# Patient Record
Sex: Male | Born: 1935 | Race: White | Hispanic: No | Smoking: Former smoker
Health system: Southern US, Community
[De-identification: ages and names within clinical notes are randomized; demographics above are authoritative.]

## PROBLEM LIST (undated history)

## (undated) DIAGNOSIS — I350 Nonrheumatic aortic (valve) stenosis: Secondary | ICD-10-CM

## (undated) DIAGNOSIS — J189 Pneumonia, unspecified organism: Secondary | ICD-10-CM

## (undated) DIAGNOSIS — M1990 Unspecified osteoarthritis, unspecified site: Secondary | ICD-10-CM

## (undated) DIAGNOSIS — R0609 Other forms of dyspnea: Secondary | ICD-10-CM

## (undated) DIAGNOSIS — R0989 Other specified symptoms and signs involving the circulatory and respiratory systems: Secondary | ICD-10-CM

## (undated) DIAGNOSIS — R0600 Dyspnea, unspecified: Secondary | ICD-10-CM

## (undated) DIAGNOSIS — K219 Gastro-esophageal reflux disease without esophagitis: Secondary | ICD-10-CM

## (undated) DIAGNOSIS — E7800 Pure hypercholesterolemia, unspecified: Secondary | ICD-10-CM

## (undated) DIAGNOSIS — I10 Essential (primary) hypertension: Secondary | ICD-10-CM

## (undated) DIAGNOSIS — I6529 Occlusion and stenosis of unspecified carotid artery: Secondary | ICD-10-CM

## (undated) HISTORY — PX: CARDIAC CATHETERIZATION: SHX172

## (undated) HISTORY — PX: TONSILLECTOMY: SUR1361

## (undated) HISTORY — DX: Essential (primary) hypertension: I10

## (undated) HISTORY — DX: Occlusion and stenosis of unspecified carotid artery: I65.29

## (undated) HISTORY — PX: MENISCUS REPAIR: SHX5179

## (undated) HISTORY — DX: Nonrheumatic aortic (valve) stenosis: I35.0

## (undated) HISTORY — DX: Other specified symptoms and signs involving the circulatory and respiratory systems: R09.89

## (undated) HISTORY — PX: COLONOSCOPY: SHX174

## (undated) HISTORY — PX: HERNIA REPAIR: SHX51

## (undated) HISTORY — DX: Gastro-esophageal reflux disease without esophagitis: K21.9

## (undated) HISTORY — DX: Other forms of dyspnea: R06.09

---
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
## 1998-03-06 ENCOUNTER — Other Ambulatory Visit: Admission: RE | Admit: 1998-03-06 | Discharge: 1998-03-06 | Payer: Self-pay | Admitting: Gastroenterology

## 1998-03-28 ENCOUNTER — Ambulatory Visit (HOSPITAL_COMMUNITY): Admission: RE | Admit: 1998-03-28 | Discharge: 1998-03-28 | Payer: Self-pay | Admitting: Gastroenterology

## 1999-12-04 ENCOUNTER — Encounter: Admission: RE | Admit: 1999-12-04 | Discharge: 1999-12-04 | Payer: Self-pay | Admitting: *Deleted

## 1999-12-04 ENCOUNTER — Encounter: Payer: Self-pay | Admitting: *Deleted

## 2002-09-08 ENCOUNTER — Ambulatory Visit (HOSPITAL_COMMUNITY): Admission: RE | Admit: 2002-09-08 | Discharge: 2002-09-08 | Payer: Self-pay | Admitting: Family Medicine

## 2002-09-08 ENCOUNTER — Encounter: Payer: Self-pay | Admitting: Family Medicine

## 2003-06-28 ENCOUNTER — Encounter: Admission: RE | Admit: 2003-06-28 | Discharge: 2003-06-28 | Payer: Self-pay | Admitting: Orthopedic Surgery

## 2003-07-17 ENCOUNTER — Encounter: Admission: RE | Admit: 2003-07-17 | Discharge: 2003-07-17 | Payer: Self-pay | Admitting: Orthopedic Surgery

## 2003-08-02 ENCOUNTER — Encounter: Admission: RE | Admit: 2003-08-02 | Discharge: 2003-08-02 | Payer: Self-pay | Admitting: Orthopedic Surgery

## 2006-07-03 ENCOUNTER — Encounter: Admission: RE | Admit: 2006-07-03 | Discharge: 2006-07-03 | Payer: Self-pay | Admitting: Orthopedic Surgery

## 2007-06-09 ENCOUNTER — Encounter: Admission: RE | Admit: 2007-06-09 | Discharge: 2007-06-09 | Payer: Self-pay | Admitting: General Surgery

## 2007-06-14 ENCOUNTER — Ambulatory Visit (HOSPITAL_BASED_OUTPATIENT_CLINIC_OR_DEPARTMENT_OTHER): Admission: RE | Admit: 2007-06-14 | Discharge: 2007-06-14 | Payer: Self-pay | Admitting: Otolaryngology

## 2010-07-16 NOTE — Op Note (Signed)
[name], [name]                ACCOUNT NO.:  [id number]   MEDICAL RECORD NO.:  [id number]          PATIENT TYPE:  AMB   LOCATION:  DSC                          FACILITY:  [facility]   PHYSICIAN:  Adolph Pollack, M.D.DATE OF BIRTH:  Apr 01, 1931   DATE OF PROCEDURE:  DATE OF DISCHARGE:                               OPERATIVE REPORT   PREOPERATIVE DIAGNOSIS:  Left inguinal hernia.   POSTOPERATIVE DIAGNOSIS:  Left inguinal hernia.   PROCEDURE:  Left inguinal hernia repair with mesh.   SURGEON:  Adolph Pollack, M.D.   ANESTHESIA:  General by way of LMA plus Marcaine local.   INDICATIONS:  This 77 year old male who is very active and has noticed a  left inguinal bulge that is somewhat symptomatic.  No obstructive  symptoms but discomfort.  He now presents for repair.  The procedure,  risks, and aftercare have been discussed with him and his wife  preoperatively.   TECHNIQUE:  He was seen in the holding area and left groin marked with  my initials.  He was then brought to the operating room, placed supine  on the operating table, and general anesthetic was administered.  The  hair in the left groin was clipped and the area was sterilely prepped  and draped.  Marcaine solution was infiltrated superficially and deep in  the left groin.  The left groin incision was made through the skin and  subcutaneous tissue and Scarpa's fascia until the external oblique  aponeurosis was exposed.  I then injected local anesthetic deep to the  external oblique aponeurosis.  I then divided the external oblique  aponeurosis through the external ring medially and up toward the  anterosuperior iliac spine laterally.  Using blunt dissection, I exposed  the internal oblique aponeurosis and muscle superiorly and the shelving  edge of the inguinal ligament inferiorly.   Following this, I noted a spermatic cord and hernia sac and direct  contents.  I isolated the spermatic cord to create a window around  it.  Noted there was also a small direct hernia present.  I dissected the  hernia sac and contents free from the spermatic cord and replaced it to  the patulous internal ring.   Following this, a piece of 3 x 6 inch polypropylene mesh was brought  into the field and anchored 2 cm medial to the pubic tubercle with 2-0  Prolene suture.  The inferior aspect of the mesh was anchored to the  shelving edge of the inguinal ligament with a running 2-0 Prolene suture  up to level 2 cm lateral to the internal ring.  A slit was cut in the  mesh, and these 2 tails wrapped around the spermatic cord.  The superior  aspect of the mesh was anchored to the internal oblique aponeurosis with  interrupted 2-0 Vicryl sutures.  The 2 tails of the mesh were then  crossed creating a new internal ring and they were anchored to the  shelving edge of the inguinal ligament with a 2-0 Prolene suture.  This  provided more than adequate coverage with good overlap of  the direct and  indirect femoral spaces.  The lateral aspect of the mesh was then tucked  deep to the external oblique aponeurosis.   Following this, hemostasis was adequate.  I closed the external oblique  aponeurosis over the mesh and cord with running 3-0 Vicryl suture.  Scarpa's fascia was closed with a running 2-0 Vicryl suture.  The skin  was closed with a 4-0 Monocryl subcuticular stitch followed by Steri-  Strips and sterile dressings.  Needle, sponge, and instrument counts  were reported to be correct prior to final closure.   He tolerated the procedure without any apparent complications.  The left  testicle was in its normal position in the scrotum, and he was taken to  recovery in satisfactory condition.      Adolph Pollack, M.D.  Electronically Signed     TJR/MEDQ  D:  06/14/2007  T:  06/15/2007  Job:  [id number]   cc:   Ralene Ok, M.D.

## 2010-11-26 LAB — COMPREHENSIVE METABOLIC PANEL
ALT: 35
AST: 27
Albumin: 4
Alkaline Phosphatase: 50
BUN: 13
CO2: 27
Calcium: 9.3
Chloride: 108
Creatinine, Ser: 0.74
GFR calc Af Amer: >60
GFR calc non Af Amer: >60
Glucose, Bld: 108 — ABNORMAL HIGH
Potassium: 4.1
Sodium: 140
Total Bilirubin: 0.7
Total Protein: 6.2

## 2010-11-26 LAB — CBC
HCT: 40.7
Hemoglobin: 14.3
MCHC: 35
MCV: 92.2
Platelets: 159
RBC: 4.42
RDW: 12.6
WBC: 4.6

## 2010-11-26 LAB — DIFFERENTIAL
Basophils Absolute: 0
Basophils Relative: 0
Eosinophils Absolute: 0.1
Eosinophils Relative: 3
Lymphocytes Relative: 28
Lymphs Abs: 1.3
Monocytes Absolute: 0.5
Monocytes Relative: 12
Neutro Abs: 2.6
Neutrophils Relative %: 58

## 2010-11-26 LAB — PROTIME-INR
INR: 0.9
Prothrombin Time: 12.7

## 2010-11-26 LAB — POCT HEMOGLOBIN-HEMACUE: Hemoglobin: 14.8

## 2010-12-31 ENCOUNTER — Other Ambulatory Visit: Payer: Self-pay | Admitting: Gastroenterology

## 2011-03-13 DIAGNOSIS — N529 Male erectile dysfunction, unspecified: Secondary | ICD-10-CM | POA: Diagnosis not present

## 2011-03-13 DIAGNOSIS — E785 Hyperlipidemia, unspecified: Secondary | ICD-10-CM | POA: Diagnosis not present

## 2011-03-13 DIAGNOSIS — E559 Vitamin D deficiency, unspecified: Secondary | ICD-10-CM | POA: Diagnosis not present

## 2011-03-13 DIAGNOSIS — M1990 Unspecified osteoarthritis, unspecified site: Secondary | ICD-10-CM | POA: Diagnosis not present

## 2011-03-18 DIAGNOSIS — E785 Hyperlipidemia, unspecified: Secondary | ICD-10-CM | POA: Diagnosis not present

## 2011-03-18 DIAGNOSIS — E559 Vitamin D deficiency, unspecified: Secondary | ICD-10-CM | POA: Diagnosis not present

## 2011-04-15 DIAGNOSIS — L259 Unspecified contact dermatitis, unspecified cause: Secondary | ICD-10-CM | POA: Diagnosis not present

## 2011-04-15 DIAGNOSIS — D2360 Other benign neoplasm of skin of unspecified upper limb, including shoulder: Secondary | ICD-10-CM | POA: Diagnosis not present

## 2011-04-15 DIAGNOSIS — L570 Actinic keratosis: Secondary | ICD-10-CM | POA: Diagnosis not present

## 2011-04-15 DIAGNOSIS — Z85820 Personal history of malignant melanoma of skin: Secondary | ICD-10-CM | POA: Diagnosis not present

## 2011-04-15 DIAGNOSIS — D235 Other benign neoplasm of skin of trunk: Secondary | ICD-10-CM | POA: Diagnosis not present

## 2011-07-24 DIAGNOSIS — M779 Enthesopathy, unspecified: Secondary | ICD-10-CM | POA: Diagnosis not present

## 2011-07-31 DIAGNOSIS — M779 Enthesopathy, unspecified: Secondary | ICD-10-CM | POA: Diagnosis not present

## 2011-09-09 DIAGNOSIS — E785 Hyperlipidemia, unspecified: Secondary | ICD-10-CM | POA: Diagnosis not present

## 2011-09-09 DIAGNOSIS — Z125 Encounter for screening for malignant neoplasm of prostate: Secondary | ICD-10-CM | POA: Diagnosis not present

## 2011-09-09 DIAGNOSIS — E559 Vitamin D deficiency, unspecified: Secondary | ICD-10-CM | POA: Diagnosis not present

## 2011-09-16 DIAGNOSIS — E785 Hyperlipidemia, unspecified: Secondary | ICD-10-CM | POA: Diagnosis not present

## 2011-09-16 DIAGNOSIS — M1990 Unspecified osteoarthritis, unspecified site: Secondary | ICD-10-CM | POA: Diagnosis not present

## 2011-09-16 DIAGNOSIS — E559 Vitamin D deficiency, unspecified: Secondary | ICD-10-CM | POA: Diagnosis not present

## 2011-09-16 DIAGNOSIS — N529 Male erectile dysfunction, unspecified: Secondary | ICD-10-CM | POA: Diagnosis not present

## 2011-09-25 DIAGNOSIS — G5760 Lesion of plantar nerve, unspecified lower limb: Secondary | ICD-10-CM | POA: Diagnosis not present

## 2011-10-01 DIAGNOSIS — M546 Pain in thoracic spine: Secondary | ICD-10-CM | POA: Diagnosis not present

## 2011-10-28 DIAGNOSIS — L821 Other seborrheic keratosis: Secondary | ICD-10-CM | POA: Diagnosis not present

## 2011-10-28 DIAGNOSIS — L570 Actinic keratosis: Secondary | ICD-10-CM | POA: Diagnosis not present

## 2011-10-28 DIAGNOSIS — Z85820 Personal history of malignant melanoma of skin: Secondary | ICD-10-CM | POA: Diagnosis not present

## 2011-10-28 DIAGNOSIS — D235 Other benign neoplasm of skin of trunk: Secondary | ICD-10-CM | POA: Diagnosis not present

## 2012-01-05 DIAGNOSIS — H31009 Unspecified chorioretinal scars, unspecified eye: Secondary | ICD-10-CM | POA: Diagnosis not present

## 2012-01-05 DIAGNOSIS — D2310 Other benign neoplasm of skin of unspecified eyelid, including canthus: Secondary | ICD-10-CM | POA: Diagnosis not present

## 2012-01-05 DIAGNOSIS — H43819 Vitreous degeneration, unspecified eye: Secondary | ICD-10-CM | POA: Diagnosis not present

## 2012-01-05 DIAGNOSIS — H2510 Age-related nuclear cataract, unspecified eye: Secondary | ICD-10-CM | POA: Diagnosis not present

## 2012-02-10 DIAGNOSIS — Z23 Encounter for immunization: Secondary | ICD-10-CM | POA: Diagnosis not present

## 2012-03-18 DIAGNOSIS — E559 Vitamin D deficiency, unspecified: Secondary | ICD-10-CM | POA: Diagnosis not present

## 2012-03-18 DIAGNOSIS — E785 Hyperlipidemia, unspecified: Secondary | ICD-10-CM | POA: Diagnosis not present

## 2012-03-18 DIAGNOSIS — R030 Elevated blood-pressure reading, without diagnosis of hypertension: Secondary | ICD-10-CM | POA: Diagnosis not present

## 2012-03-18 DIAGNOSIS — N529 Male erectile dysfunction, unspecified: Secondary | ICD-10-CM | POA: Diagnosis not present

## 2012-06-17 DIAGNOSIS — M545 Low back pain: Secondary | ICD-10-CM | POA: Diagnosis not present

## 2012-06-17 DIAGNOSIS — M5137 Other intervertebral disc degeneration, lumbosacral region: Secondary | ICD-10-CM | POA: Diagnosis not present

## 2012-06-17 DIAGNOSIS — M999 Biomechanical lesion, unspecified: Secondary | ICD-10-CM | POA: Diagnosis not present

## 2012-06-17 DIAGNOSIS — M4716 Other spondylosis with myelopathy, lumbar region: Secondary | ICD-10-CM | POA: Diagnosis not present

## 2012-06-22 DIAGNOSIS — M999 Biomechanical lesion, unspecified: Secondary | ICD-10-CM | POA: Diagnosis not present

## 2012-06-22 DIAGNOSIS — M545 Low back pain: Secondary | ICD-10-CM | POA: Diagnosis not present

## 2012-06-22 DIAGNOSIS — M5137 Other intervertebral disc degeneration, lumbosacral region: Secondary | ICD-10-CM | POA: Diagnosis not present

## 2012-06-22 DIAGNOSIS — M4716 Other spondylosis with myelopathy, lumbar region: Secondary | ICD-10-CM | POA: Diagnosis not present

## 2012-06-23 DIAGNOSIS — M4716 Other spondylosis with myelopathy, lumbar region: Secondary | ICD-10-CM | POA: Diagnosis not present

## 2012-06-23 DIAGNOSIS — M5137 Other intervertebral disc degeneration, lumbosacral region: Secondary | ICD-10-CM | POA: Diagnosis not present

## 2012-06-23 DIAGNOSIS — M545 Low back pain: Secondary | ICD-10-CM | POA: Diagnosis not present

## 2012-06-23 DIAGNOSIS — M999 Biomechanical lesion, unspecified: Secondary | ICD-10-CM | POA: Diagnosis not present

## 2012-06-24 DIAGNOSIS — M545 Low back pain: Secondary | ICD-10-CM | POA: Diagnosis not present

## 2012-06-24 DIAGNOSIS — M4716 Other spondylosis with myelopathy, lumbar region: Secondary | ICD-10-CM | POA: Diagnosis not present

## 2012-06-24 DIAGNOSIS — M5137 Other intervertebral disc degeneration, lumbosacral region: Secondary | ICD-10-CM | POA: Diagnosis not present

## 2012-06-24 DIAGNOSIS — M999 Biomechanical lesion, unspecified: Secondary | ICD-10-CM | POA: Diagnosis not present

## 2012-06-29 DIAGNOSIS — M999 Biomechanical lesion, unspecified: Secondary | ICD-10-CM | POA: Diagnosis not present

## 2012-06-29 DIAGNOSIS — M5137 Other intervertebral disc degeneration, lumbosacral region: Secondary | ICD-10-CM | POA: Diagnosis not present

## 2012-06-29 DIAGNOSIS — M4716 Other spondylosis with myelopathy, lumbar region: Secondary | ICD-10-CM | POA: Diagnosis not present

## 2012-06-29 DIAGNOSIS — R030 Elevated blood-pressure reading, without diagnosis of hypertension: Secondary | ICD-10-CM | POA: Diagnosis not present

## 2012-06-29 DIAGNOSIS — M545 Low back pain: Secondary | ICD-10-CM | POA: Diagnosis not present

## 2012-06-30 DIAGNOSIS — M999 Biomechanical lesion, unspecified: Secondary | ICD-10-CM | POA: Diagnosis not present

## 2012-06-30 DIAGNOSIS — M4716 Other spondylosis with myelopathy, lumbar region: Secondary | ICD-10-CM | POA: Diagnosis not present

## 2012-06-30 DIAGNOSIS — M545 Low back pain: Secondary | ICD-10-CM | POA: Diagnosis not present

## 2012-06-30 DIAGNOSIS — M5137 Other intervertebral disc degeneration, lumbosacral region: Secondary | ICD-10-CM | POA: Diagnosis not present

## 2012-07-01 DIAGNOSIS — M545 Low back pain: Secondary | ICD-10-CM | POA: Diagnosis not present

## 2012-07-01 DIAGNOSIS — M5137 Other intervertebral disc degeneration, lumbosacral region: Secondary | ICD-10-CM | POA: Diagnosis not present

## 2012-07-01 DIAGNOSIS — M4716 Other spondylosis with myelopathy, lumbar region: Secondary | ICD-10-CM | POA: Diagnosis not present

## 2012-07-01 DIAGNOSIS — M999 Biomechanical lesion, unspecified: Secondary | ICD-10-CM | POA: Diagnosis not present

## 2012-07-06 DIAGNOSIS — M5137 Other intervertebral disc degeneration, lumbosacral region: Secondary | ICD-10-CM | POA: Diagnosis not present

## 2012-07-06 DIAGNOSIS — M545 Low back pain: Secondary | ICD-10-CM | POA: Diagnosis not present

## 2012-07-06 DIAGNOSIS — M4716 Other spondylosis with myelopathy, lumbar region: Secondary | ICD-10-CM | POA: Diagnosis not present

## 2012-07-06 DIAGNOSIS — M999 Biomechanical lesion, unspecified: Secondary | ICD-10-CM | POA: Diagnosis not present

## 2012-07-07 DIAGNOSIS — M545 Low back pain: Secondary | ICD-10-CM | POA: Diagnosis not present

## 2012-07-07 DIAGNOSIS — M999 Biomechanical lesion, unspecified: Secondary | ICD-10-CM | POA: Diagnosis not present

## 2012-07-07 DIAGNOSIS — M5137 Other intervertebral disc degeneration, lumbosacral region: Secondary | ICD-10-CM | POA: Diagnosis not present

## 2012-07-07 DIAGNOSIS — M4716 Other spondylosis with myelopathy, lumbar region: Secondary | ICD-10-CM | POA: Diagnosis not present

## 2012-07-08 DIAGNOSIS — M4716 Other spondylosis with myelopathy, lumbar region: Secondary | ICD-10-CM | POA: Diagnosis not present

## 2012-07-08 DIAGNOSIS — M545 Low back pain: Secondary | ICD-10-CM | POA: Diagnosis not present

## 2012-07-08 DIAGNOSIS — M5137 Other intervertebral disc degeneration, lumbosacral region: Secondary | ICD-10-CM | POA: Diagnosis not present

## 2012-07-08 DIAGNOSIS — M999 Biomechanical lesion, unspecified: Secondary | ICD-10-CM | POA: Diagnosis not present

## 2012-07-13 DIAGNOSIS — M5137 Other intervertebral disc degeneration, lumbosacral region: Secondary | ICD-10-CM | POA: Diagnosis not present

## 2012-07-13 DIAGNOSIS — M545 Low back pain: Secondary | ICD-10-CM | POA: Diagnosis not present

## 2012-07-13 DIAGNOSIS — M999 Biomechanical lesion, unspecified: Secondary | ICD-10-CM | POA: Diagnosis not present

## 2012-07-13 DIAGNOSIS — M4716 Other spondylosis with myelopathy, lumbar region: Secondary | ICD-10-CM | POA: Diagnosis not present

## 2012-07-14 DIAGNOSIS — M4716 Other spondylosis with myelopathy, lumbar region: Secondary | ICD-10-CM | POA: Diagnosis not present

## 2012-07-14 DIAGNOSIS — M545 Low back pain: Secondary | ICD-10-CM | POA: Diagnosis not present

## 2012-07-14 DIAGNOSIS — M999 Biomechanical lesion, unspecified: Secondary | ICD-10-CM | POA: Diagnosis not present

## 2012-07-14 DIAGNOSIS — M5137 Other intervertebral disc degeneration, lumbosacral region: Secondary | ICD-10-CM | POA: Diagnosis not present

## 2012-07-20 DIAGNOSIS — M5137 Other intervertebral disc degeneration, lumbosacral region: Secondary | ICD-10-CM | POA: Diagnosis not present

## 2012-07-20 DIAGNOSIS — M4716 Other spondylosis with myelopathy, lumbar region: Secondary | ICD-10-CM | POA: Diagnosis not present

## 2012-07-20 DIAGNOSIS — M999 Biomechanical lesion, unspecified: Secondary | ICD-10-CM | POA: Diagnosis not present

## 2012-07-20 DIAGNOSIS — M545 Low back pain: Secondary | ICD-10-CM | POA: Diagnosis not present

## 2012-07-22 DIAGNOSIS — M545 Low back pain: Secondary | ICD-10-CM | POA: Diagnosis not present

## 2012-07-22 DIAGNOSIS — M4716 Other spondylosis with myelopathy, lumbar region: Secondary | ICD-10-CM | POA: Diagnosis not present

## 2012-07-22 DIAGNOSIS — M5137 Other intervertebral disc degeneration, lumbosacral region: Secondary | ICD-10-CM | POA: Diagnosis not present

## 2012-07-22 DIAGNOSIS — M999 Biomechanical lesion, unspecified: Secondary | ICD-10-CM | POA: Diagnosis not present

## 2012-07-27 DIAGNOSIS — M545 Low back pain: Secondary | ICD-10-CM | POA: Diagnosis not present

## 2012-07-27 DIAGNOSIS — M999 Biomechanical lesion, unspecified: Secondary | ICD-10-CM | POA: Diagnosis not present

## 2012-07-27 DIAGNOSIS — M5137 Other intervertebral disc degeneration, lumbosacral region: Secondary | ICD-10-CM | POA: Diagnosis not present

## 2012-07-27 DIAGNOSIS — M4716 Other spondylosis with myelopathy, lumbar region: Secondary | ICD-10-CM | POA: Diagnosis not present

## 2012-07-29 DIAGNOSIS — M999 Biomechanical lesion, unspecified: Secondary | ICD-10-CM | POA: Diagnosis not present

## 2012-07-29 DIAGNOSIS — M5137 Other intervertebral disc degeneration, lumbosacral region: Secondary | ICD-10-CM | POA: Diagnosis not present

## 2012-07-29 DIAGNOSIS — M545 Low back pain: Secondary | ICD-10-CM | POA: Diagnosis not present

## 2012-07-29 DIAGNOSIS — M4716 Other spondylosis with myelopathy, lumbar region: Secondary | ICD-10-CM | POA: Diagnosis not present

## 2012-08-03 DIAGNOSIS — M545 Low back pain: Secondary | ICD-10-CM | POA: Diagnosis not present

## 2012-08-03 DIAGNOSIS — M999 Biomechanical lesion, unspecified: Secondary | ICD-10-CM | POA: Diagnosis not present

## 2012-08-03 DIAGNOSIS — M5137 Other intervertebral disc degeneration, lumbosacral region: Secondary | ICD-10-CM | POA: Diagnosis not present

## 2012-08-03 DIAGNOSIS — M4716 Other spondylosis with myelopathy, lumbar region: Secondary | ICD-10-CM | POA: Diagnosis not present

## 2012-08-05 DIAGNOSIS — M4716 Other spondylosis with myelopathy, lumbar region: Secondary | ICD-10-CM | POA: Diagnosis not present

## 2012-08-05 DIAGNOSIS — M545 Low back pain: Secondary | ICD-10-CM | POA: Diagnosis not present

## 2012-08-05 DIAGNOSIS — M999 Biomechanical lesion, unspecified: Secondary | ICD-10-CM | POA: Diagnosis not present

## 2012-08-05 DIAGNOSIS — M5137 Other intervertebral disc degeneration, lumbosacral region: Secondary | ICD-10-CM | POA: Diagnosis not present

## 2012-08-10 DIAGNOSIS — M999 Biomechanical lesion, unspecified: Secondary | ICD-10-CM | POA: Diagnosis not present

## 2012-08-10 DIAGNOSIS — M5137 Other intervertebral disc degeneration, lumbosacral region: Secondary | ICD-10-CM | POA: Diagnosis not present

## 2012-08-10 DIAGNOSIS — M4716 Other spondylosis with myelopathy, lumbar region: Secondary | ICD-10-CM | POA: Diagnosis not present

## 2012-08-10 DIAGNOSIS — M545 Low back pain: Secondary | ICD-10-CM | POA: Diagnosis not present

## 2012-08-12 DIAGNOSIS — M5137 Other intervertebral disc degeneration, lumbosacral region: Secondary | ICD-10-CM | POA: Diagnosis not present

## 2012-08-12 DIAGNOSIS — M545 Low back pain: Secondary | ICD-10-CM | POA: Diagnosis not present

## 2012-08-12 DIAGNOSIS — M999 Biomechanical lesion, unspecified: Secondary | ICD-10-CM | POA: Diagnosis not present

## 2012-08-12 DIAGNOSIS — M4716 Other spondylosis with myelopathy, lumbar region: Secondary | ICD-10-CM | POA: Diagnosis not present

## 2012-08-17 DIAGNOSIS — M5137 Other intervertebral disc degeneration, lumbosacral region: Secondary | ICD-10-CM | POA: Diagnosis not present

## 2012-08-17 DIAGNOSIS — M545 Low back pain: Secondary | ICD-10-CM | POA: Diagnosis not present

## 2012-08-17 DIAGNOSIS — M999 Biomechanical lesion, unspecified: Secondary | ICD-10-CM | POA: Diagnosis not present

## 2012-08-17 DIAGNOSIS — M4716 Other spondylosis with myelopathy, lumbar region: Secondary | ICD-10-CM | POA: Diagnosis not present

## 2012-09-14 DIAGNOSIS — E559 Vitamin D deficiency, unspecified: Secondary | ICD-10-CM | POA: Diagnosis not present

## 2012-09-14 DIAGNOSIS — E785 Hyperlipidemia, unspecified: Secondary | ICD-10-CM | POA: Diagnosis not present

## 2012-09-14 DIAGNOSIS — Z0000 Encounter for general adult medical examination without abnormal findings: Secondary | ICD-10-CM | POA: Diagnosis not present

## 2012-09-14 DIAGNOSIS — E663 Overweight: Secondary | ICD-10-CM | POA: Diagnosis not present

## 2012-09-14 DIAGNOSIS — Z1331 Encounter for screening for depression: Secondary | ICD-10-CM | POA: Diagnosis not present

## 2012-09-21 DIAGNOSIS — E559 Vitamin D deficiency, unspecified: Secondary | ICD-10-CM | POA: Diagnosis not present

## 2012-09-21 DIAGNOSIS — N529 Male erectile dysfunction, unspecified: Secondary | ICD-10-CM | POA: Diagnosis not present

## 2012-09-21 DIAGNOSIS — E785 Hyperlipidemia, unspecified: Secondary | ICD-10-CM | POA: Diagnosis not present

## 2012-09-21 DIAGNOSIS — Z85820 Personal history of malignant melanoma of skin: Secondary | ICD-10-CM | POA: Diagnosis not present

## 2012-09-21 DIAGNOSIS — D235 Other benign neoplasm of skin of trunk: Secondary | ICD-10-CM | POA: Diagnosis not present

## 2012-09-21 DIAGNOSIS — R7301 Impaired fasting glucose: Secondary | ICD-10-CM | POA: Diagnosis not present

## 2012-09-21 DIAGNOSIS — L570 Actinic keratosis: Secondary | ICD-10-CM | POA: Diagnosis not present

## 2012-10-11 DIAGNOSIS — R197 Diarrhea, unspecified: Secondary | ICD-10-CM | POA: Diagnosis not present

## 2012-10-12 DIAGNOSIS — M999 Biomechanical lesion, unspecified: Secondary | ICD-10-CM | POA: Diagnosis not present

## 2012-10-12 DIAGNOSIS — M545 Low back pain: Secondary | ICD-10-CM | POA: Diagnosis not present

## 2012-10-12 DIAGNOSIS — M5137 Other intervertebral disc degeneration, lumbosacral region: Secondary | ICD-10-CM | POA: Diagnosis not present

## 2012-10-12 DIAGNOSIS — M4716 Other spondylosis with myelopathy, lumbar region: Secondary | ICD-10-CM | POA: Diagnosis not present

## 2012-10-28 DIAGNOSIS — M545 Low back pain: Secondary | ICD-10-CM | POA: Diagnosis not present

## 2012-10-28 DIAGNOSIS — M5137 Other intervertebral disc degeneration, lumbosacral region: Secondary | ICD-10-CM | POA: Diagnosis not present

## 2012-10-28 DIAGNOSIS — M4716 Other spondylosis with myelopathy, lumbar region: Secondary | ICD-10-CM | POA: Diagnosis not present

## 2012-10-28 DIAGNOSIS — M999 Biomechanical lesion, unspecified: Secondary | ICD-10-CM | POA: Diagnosis not present

## 2012-11-09 DIAGNOSIS — M545 Low back pain: Secondary | ICD-10-CM | POA: Diagnosis not present

## 2012-11-09 DIAGNOSIS — M4716 Other spondylosis with myelopathy, lumbar region: Secondary | ICD-10-CM | POA: Diagnosis not present

## 2012-11-09 DIAGNOSIS — M5137 Other intervertebral disc degeneration, lumbosacral region: Secondary | ICD-10-CM | POA: Diagnosis not present

## 2012-11-09 DIAGNOSIS — M999 Biomechanical lesion, unspecified: Secondary | ICD-10-CM | POA: Diagnosis not present

## 2012-11-16 DIAGNOSIS — M999 Biomechanical lesion, unspecified: Secondary | ICD-10-CM | POA: Diagnosis not present

## 2012-11-16 DIAGNOSIS — M5137 Other intervertebral disc degeneration, lumbosacral region: Secondary | ICD-10-CM | POA: Diagnosis not present

## 2012-11-16 DIAGNOSIS — M545 Low back pain: Secondary | ICD-10-CM | POA: Diagnosis not present

## 2012-11-16 DIAGNOSIS — M4716 Other spondylosis with myelopathy, lumbar region: Secondary | ICD-10-CM | POA: Diagnosis not present

## 2012-12-23 DIAGNOSIS — Z23 Encounter for immunization: Secondary | ICD-10-CM | POA: Diagnosis not present

## 2013-01-06 DIAGNOSIS — H2510 Age-related nuclear cataract, unspecified eye: Secondary | ICD-10-CM | POA: Diagnosis not present

## 2013-01-06 DIAGNOSIS — H31009 Unspecified chorioretinal scars, unspecified eye: Secondary | ICD-10-CM | POA: Diagnosis not present

## 2013-01-06 DIAGNOSIS — D2310 Other benign neoplasm of skin of unspecified eyelid, including canthus: Secondary | ICD-10-CM | POA: Diagnosis not present

## 2013-01-06 DIAGNOSIS — H5200 Hypermetropia, unspecified eye: Secondary | ICD-10-CM | POA: Diagnosis not present

## 2013-02-09 DIAGNOSIS — D485 Neoplasm of uncertain behavior of skin: Secondary | ICD-10-CM | POA: Diagnosis not present

## 2013-02-09 DIAGNOSIS — C44791 Other specified malignant neoplasm of skin of unspecified lower limb, including hip: Secondary | ICD-10-CM | POA: Diagnosis not present

## 2013-03-22 DIAGNOSIS — E538 Deficiency of other specified B group vitamins: Secondary | ICD-10-CM | POA: Diagnosis not present

## 2013-03-22 DIAGNOSIS — E785 Hyperlipidemia, unspecified: Secondary | ICD-10-CM | POA: Diagnosis not present

## 2013-03-22 DIAGNOSIS — R7301 Impaired fasting glucose: Secondary | ICD-10-CM | POA: Diagnosis not present

## 2013-03-22 DIAGNOSIS — E559 Vitamin D deficiency, unspecified: Secondary | ICD-10-CM | POA: Diagnosis not present

## 2013-03-29 DIAGNOSIS — R030 Elevated blood-pressure reading, without diagnosis of hypertension: Secondary | ICD-10-CM | POA: Diagnosis not present

## 2013-03-29 DIAGNOSIS — N529 Male erectile dysfunction, unspecified: Secondary | ICD-10-CM | POA: Diagnosis not present

## 2013-03-29 DIAGNOSIS — E559 Vitamin D deficiency, unspecified: Secondary | ICD-10-CM | POA: Diagnosis not present

## 2013-03-29 DIAGNOSIS — E785 Hyperlipidemia, unspecified: Secondary | ICD-10-CM | POA: Diagnosis not present

## 2013-04-06 DIAGNOSIS — L259 Unspecified contact dermatitis, unspecified cause: Secondary | ICD-10-CM | POA: Diagnosis not present

## 2013-04-06 DIAGNOSIS — L570 Actinic keratosis: Secondary | ICD-10-CM | POA: Diagnosis not present

## 2013-04-06 DIAGNOSIS — L820 Inflamed seborrheic keratosis: Secondary | ICD-10-CM | POA: Diagnosis not present

## 2013-04-06 DIAGNOSIS — Z85820 Personal history of malignant melanoma of skin: Secondary | ICD-10-CM | POA: Diagnosis not present

## 2013-04-06 DIAGNOSIS — D235 Other benign neoplasm of skin of trunk: Secondary | ICD-10-CM | POA: Diagnosis not present

## 2013-04-14 DIAGNOSIS — J209 Acute bronchitis, unspecified: Secondary | ICD-10-CM | POA: Diagnosis not present

## 2013-06-21 DIAGNOSIS — L570 Actinic keratosis: Secondary | ICD-10-CM | POA: Diagnosis not present

## 2013-06-21 DIAGNOSIS — Z85820 Personal history of malignant melanoma of skin: Secondary | ICD-10-CM | POA: Diagnosis not present

## 2013-06-21 DIAGNOSIS — L219 Seborrheic dermatitis, unspecified: Secondary | ICD-10-CM | POA: Diagnosis not present

## 2013-06-23 DIAGNOSIS — G5760 Lesion of plantar nerve, unspecified lower limb: Secondary | ICD-10-CM

## 2013-06-29 DIAGNOSIS — M7020 Olecranon bursitis, unspecified elbow: Secondary | ICD-10-CM | POA: Diagnosis not present

## 2013-06-29 DIAGNOSIS — M545 Low back pain, unspecified: Secondary | ICD-10-CM | POA: Diagnosis not present

## 2013-06-29 DIAGNOSIS — S5000XA Contusion of unspecified elbow, initial encounter: Secondary | ICD-10-CM | POA: Diagnosis not present

## 2013-06-29 DIAGNOSIS — M5137 Other intervertebral disc degeneration, lumbosacral region: Secondary | ICD-10-CM | POA: Diagnosis not present

## 2013-09-06 DIAGNOSIS — Z85820 Personal history of malignant melanoma of skin: Secondary | ICD-10-CM | POA: Diagnosis not present

## 2013-09-06 DIAGNOSIS — L820 Inflamed seborrheic keratosis: Secondary | ICD-10-CM | POA: Diagnosis not present

## 2013-09-06 DIAGNOSIS — L570 Actinic keratosis: Secondary | ICD-10-CM | POA: Diagnosis not present

## 2013-09-06 DIAGNOSIS — L851 Acquired keratosis [keratoderma] palmaris et plantaris: Secondary | ICD-10-CM | POA: Diagnosis not present

## 2013-09-20 DIAGNOSIS — Z0000 Encounter for general adult medical examination without abnormal findings: Secondary | ICD-10-CM | POA: Diagnosis not present

## 2013-09-20 DIAGNOSIS — E785 Hyperlipidemia, unspecified: Secondary | ICD-10-CM | POA: Diagnosis not present

## 2013-09-20 DIAGNOSIS — E559 Vitamin D deficiency, unspecified: Secondary | ICD-10-CM | POA: Diagnosis not present

## 2013-09-20 DIAGNOSIS — Z125 Encounter for screening for malignant neoplasm of prostate: Secondary | ICD-10-CM | POA: Diagnosis not present

## 2013-09-20 DIAGNOSIS — R7301 Impaired fasting glucose: Secondary | ICD-10-CM | POA: Diagnosis not present

## 2013-09-20 DIAGNOSIS — E538 Deficiency of other specified B group vitamins: Secondary | ICD-10-CM | POA: Diagnosis not present

## 2013-09-20 DIAGNOSIS — Z1331 Encounter for screening for depression: Secondary | ICD-10-CM | POA: Diagnosis not present

## 2013-10-04 DIAGNOSIS — E785 Hyperlipidemia, unspecified: Secondary | ICD-10-CM | POA: Diagnosis not present

## 2013-10-04 DIAGNOSIS — R030 Elevated blood-pressure reading, without diagnosis of hypertension: Secondary | ICD-10-CM | POA: Diagnosis not present

## 2013-10-04 DIAGNOSIS — E538 Deficiency of other specified B group vitamins: Secondary | ICD-10-CM | POA: Diagnosis not present

## 2013-10-04 DIAGNOSIS — E559 Vitamin D deficiency, unspecified: Secondary | ICD-10-CM | POA: Diagnosis not present

## 2013-10-04 DIAGNOSIS — R7301 Impaired fasting glucose: Secondary | ICD-10-CM | POA: Diagnosis not present

## 2013-10-06 DIAGNOSIS — G5760 Lesion of plantar nerve, unspecified lower limb: Secondary | ICD-10-CM

## 2013-12-13 DIAGNOSIS — Z23 Encounter for immunization: Secondary | ICD-10-CM | POA: Diagnosis not present

## 2013-12-20 DIAGNOSIS — L570 Actinic keratosis: Secondary | ICD-10-CM | POA: Diagnosis not present

## 2013-12-20 DIAGNOSIS — X32XXXD Exposure to sunlight, subsequent encounter: Secondary | ICD-10-CM | POA: Diagnosis not present

## 2013-12-20 DIAGNOSIS — L820 Inflamed seborrheic keratosis: Secondary | ICD-10-CM | POA: Diagnosis not present

## 2014-01-12 DIAGNOSIS — H43813 Vitreous degeneration, bilateral: Secondary | ICD-10-CM | POA: Diagnosis not present

## 2014-01-12 DIAGNOSIS — D2311 Other benign neoplasm of skin of right eyelid, including canthus: Secondary | ICD-10-CM | POA: Diagnosis not present

## 2014-01-12 DIAGNOSIS — H2513 Age-related nuclear cataract, bilateral: Secondary | ICD-10-CM | POA: Diagnosis not present

## 2014-01-12 DIAGNOSIS — H31001 Unspecified chorioretinal scars, right eye: Secondary | ICD-10-CM | POA: Diagnosis not present

## 2014-04-04 DIAGNOSIS — E785 Hyperlipidemia, unspecified: Secondary | ICD-10-CM | POA: Diagnosis not present

## 2014-04-04 DIAGNOSIS — E559 Vitamin D deficiency, unspecified: Secondary | ICD-10-CM | POA: Diagnosis not present

## 2014-04-04 DIAGNOSIS — D81818 Other biotin-dependent carboxylase deficiency: Secondary | ICD-10-CM | POA: Diagnosis not present

## 2014-04-04 DIAGNOSIS — R7301 Impaired fasting glucose: Secondary | ICD-10-CM | POA: Diagnosis not present

## 2014-04-11 DIAGNOSIS — Z23 Encounter for immunization: Secondary | ICD-10-CM | POA: Diagnosis not present

## 2014-04-11 DIAGNOSIS — R7301 Impaired fasting glucose: Secondary | ICD-10-CM | POA: Diagnosis not present

## 2014-04-11 DIAGNOSIS — E785 Hyperlipidemia, unspecified: Secondary | ICD-10-CM | POA: Diagnosis not present

## 2014-04-11 DIAGNOSIS — E559 Vitamin D deficiency, unspecified: Secondary | ICD-10-CM | POA: Diagnosis not present

## 2014-04-11 DIAGNOSIS — N529 Male erectile dysfunction, unspecified: Secondary | ICD-10-CM | POA: Diagnosis not present

## 2014-04-25 DIAGNOSIS — R011 Cardiac murmur, unspecified: Secondary | ICD-10-CM | POA: Diagnosis not present

## 2014-04-28 DIAGNOSIS — Z08 Encounter for follow-up examination after completed treatment for malignant neoplasm: Secondary | ICD-10-CM | POA: Diagnosis not present

## 2014-04-28 DIAGNOSIS — Z1283 Encounter for screening for malignant neoplasm of skin: Secondary | ICD-10-CM | POA: Diagnosis not present

## 2014-04-28 DIAGNOSIS — Z85820 Personal history of malignant melanoma of skin: Secondary | ICD-10-CM | POA: Diagnosis not present

## 2014-04-28 DIAGNOSIS — L820 Inflamed seborrheic keratosis: Secondary | ICD-10-CM | POA: Diagnosis not present

## 2014-04-28 DIAGNOSIS — X32XXXD Exposure to sunlight, subsequent encounter: Secondary | ICD-10-CM | POA: Diagnosis not present

## 2014-04-28 DIAGNOSIS — L570 Actinic keratosis: Secondary | ICD-10-CM | POA: Diagnosis not present

## 2014-05-12 DIAGNOSIS — H00015 Hordeolum externum left lower eyelid: Secondary | ICD-10-CM | POA: Diagnosis not present

## 2014-08-23 DIAGNOSIS — L820 Inflamed seborrheic keratosis: Secondary | ICD-10-CM | POA: Diagnosis not present

## 2014-08-23 DIAGNOSIS — D045 Carcinoma in situ of skin of trunk: Secondary | ICD-10-CM | POA: Diagnosis not present

## 2014-10-31 DIAGNOSIS — D81818 Other biotin-dependent carboxylase deficiency: Secondary | ICD-10-CM | POA: Diagnosis not present

## 2014-10-31 DIAGNOSIS — R7301 Impaired fasting glucose: Secondary | ICD-10-CM | POA: Diagnosis not present

## 2014-10-31 DIAGNOSIS — E785 Hyperlipidemia, unspecified: Secondary | ICD-10-CM | POA: Diagnosis not present

## 2014-10-31 DIAGNOSIS — E559 Vitamin D deficiency, unspecified: Secondary | ICD-10-CM | POA: Diagnosis not present

## 2014-10-31 DIAGNOSIS — Z125 Encounter for screening for malignant neoplasm of prostate: Secondary | ICD-10-CM | POA: Diagnosis not present

## 2014-11-15 DIAGNOSIS — Z85828 Personal history of other malignant neoplasm of skin: Secondary | ICD-10-CM | POA: Diagnosis not present

## 2014-11-15 DIAGNOSIS — Z08 Encounter for follow-up examination after completed treatment for malignant neoplasm: Secondary | ICD-10-CM | POA: Diagnosis not present

## 2014-11-15 DIAGNOSIS — L570 Actinic keratosis: Secondary | ICD-10-CM | POA: Diagnosis not present

## 2014-11-15 DIAGNOSIS — X32XXXD Exposure to sunlight, subsequent encounter: Secondary | ICD-10-CM | POA: Diagnosis not present

## 2014-12-12 DIAGNOSIS — Z85828 Personal history of other malignant neoplasm of skin: Secondary | ICD-10-CM | POA: Diagnosis not present

## 2014-12-12 DIAGNOSIS — C44320 Squamous cell carcinoma of skin of unspecified parts of face: Secondary | ICD-10-CM | POA: Diagnosis not present

## 2014-12-12 DIAGNOSIS — C44629 Squamous cell carcinoma of skin of left upper limb, including shoulder: Secondary | ICD-10-CM | POA: Diagnosis not present

## 2014-12-12 DIAGNOSIS — Z08 Encounter for follow-up examination after completed treatment for malignant neoplasm: Secondary | ICD-10-CM | POA: Diagnosis not present

## 2014-12-12 DIAGNOSIS — L570 Actinic keratosis: Secondary | ICD-10-CM | POA: Diagnosis not present

## 2014-12-12 DIAGNOSIS — X32XXXD Exposure to sunlight, subsequent encounter: Secondary | ICD-10-CM | POA: Diagnosis not present

## 2015-01-12 DIAGNOSIS — M9903 Segmental and somatic dysfunction of lumbar region: Secondary | ICD-10-CM | POA: Diagnosis not present

## 2015-01-12 DIAGNOSIS — M9902 Segmental and somatic dysfunction of thoracic region: Secondary | ICD-10-CM | POA: Diagnosis not present

## 2015-01-12 DIAGNOSIS — M5417 Radiculopathy, lumbosacral region: Secondary | ICD-10-CM | POA: Diagnosis not present

## 2015-01-12 DIAGNOSIS — M9905 Segmental and somatic dysfunction of pelvic region: Secondary | ICD-10-CM | POA: Diagnosis not present

## 2015-01-12 DIAGNOSIS — M5136 Other intervertebral disc degeneration, lumbar region: Secondary | ICD-10-CM | POA: Diagnosis not present

## 2015-01-12 DIAGNOSIS — M5414 Radiculopathy, thoracic region: Secondary | ICD-10-CM | POA: Diagnosis not present

## 2015-01-23 DIAGNOSIS — H25013 Cortical age-related cataract, bilateral: Secondary | ICD-10-CM | POA: Diagnosis not present

## 2015-01-23 DIAGNOSIS — H2513 Age-related nuclear cataract, bilateral: Secondary | ICD-10-CM | POA: Diagnosis not present

## 2015-01-23 DIAGNOSIS — Z85828 Personal history of other malignant neoplasm of skin: Secondary | ICD-10-CM | POA: Diagnosis not present

## 2015-01-23 DIAGNOSIS — Z08 Encounter for follow-up examination after completed treatment for malignant neoplasm: Secondary | ICD-10-CM | POA: Diagnosis not present

## 2015-01-23 DIAGNOSIS — H31001 Unspecified chorioretinal scars, right eye: Secondary | ICD-10-CM | POA: Diagnosis not present

## 2015-01-23 DIAGNOSIS — H43811 Vitreous degeneration, right eye: Secondary | ICD-10-CM | POA: Diagnosis not present

## 2015-01-24 DIAGNOSIS — M9902 Segmental and somatic dysfunction of thoracic region: Secondary | ICD-10-CM | POA: Diagnosis not present

## 2015-01-24 DIAGNOSIS — M9905 Segmental and somatic dysfunction of pelvic region: Secondary | ICD-10-CM | POA: Diagnosis not present

## 2015-01-24 DIAGNOSIS — M9903 Segmental and somatic dysfunction of lumbar region: Secondary | ICD-10-CM | POA: Diagnosis not present

## 2015-01-24 DIAGNOSIS — M5417 Radiculopathy, lumbosacral region: Secondary | ICD-10-CM | POA: Diagnosis not present

## 2015-01-24 DIAGNOSIS — M5414 Radiculopathy, thoracic region: Secondary | ICD-10-CM | POA: Diagnosis not present

## 2015-01-24 DIAGNOSIS — M5136 Other intervertebral disc degeneration, lumbar region: Secondary | ICD-10-CM | POA: Diagnosis not present

## 2015-01-26 DIAGNOSIS — M5414 Radiculopathy, thoracic region: Secondary | ICD-10-CM | POA: Diagnosis not present

## 2015-01-26 DIAGNOSIS — M9903 Segmental and somatic dysfunction of lumbar region: Secondary | ICD-10-CM | POA: Diagnosis not present

## 2015-01-26 DIAGNOSIS — M9905 Segmental and somatic dysfunction of pelvic region: Secondary | ICD-10-CM | POA: Diagnosis not present

## 2015-01-26 DIAGNOSIS — M5136 Other intervertebral disc degeneration, lumbar region: Secondary | ICD-10-CM | POA: Diagnosis not present

## 2015-01-26 DIAGNOSIS — M9902 Segmental and somatic dysfunction of thoracic region: Secondary | ICD-10-CM | POA: Diagnosis not present

## 2015-01-26 DIAGNOSIS — M5417 Radiculopathy, lumbosacral region: Secondary | ICD-10-CM | POA: Diagnosis not present

## 2015-01-29 DIAGNOSIS — M5417 Radiculopathy, lumbosacral region: Secondary | ICD-10-CM | POA: Diagnosis not present

## 2015-01-29 DIAGNOSIS — M5136 Other intervertebral disc degeneration, lumbar region: Secondary | ICD-10-CM | POA: Diagnosis not present

## 2015-01-29 DIAGNOSIS — M9905 Segmental and somatic dysfunction of pelvic region: Secondary | ICD-10-CM | POA: Diagnosis not present

## 2015-01-29 DIAGNOSIS — M5414 Radiculopathy, thoracic region: Secondary | ICD-10-CM | POA: Diagnosis not present

## 2015-01-29 DIAGNOSIS — M9902 Segmental and somatic dysfunction of thoracic region: Secondary | ICD-10-CM | POA: Diagnosis not present

## 2015-01-29 DIAGNOSIS — M9903 Segmental and somatic dysfunction of lumbar region: Secondary | ICD-10-CM | POA: Diagnosis not present

## 2015-01-31 DIAGNOSIS — M9905 Segmental and somatic dysfunction of pelvic region: Secondary | ICD-10-CM | POA: Diagnosis not present

## 2015-01-31 DIAGNOSIS — M5417 Radiculopathy, lumbosacral region: Secondary | ICD-10-CM | POA: Diagnosis not present

## 2015-01-31 DIAGNOSIS — M5414 Radiculopathy, thoracic region: Secondary | ICD-10-CM | POA: Diagnosis not present

## 2015-01-31 DIAGNOSIS — M9902 Segmental and somatic dysfunction of thoracic region: Secondary | ICD-10-CM | POA: Diagnosis not present

## 2015-01-31 DIAGNOSIS — M5136 Other intervertebral disc degeneration, lumbar region: Secondary | ICD-10-CM | POA: Diagnosis not present

## 2015-01-31 DIAGNOSIS — M9903 Segmental and somatic dysfunction of lumbar region: Secondary | ICD-10-CM | POA: Diagnosis not present

## 2015-02-02 DIAGNOSIS — M9905 Segmental and somatic dysfunction of pelvic region: Secondary | ICD-10-CM | POA: Diagnosis not present

## 2015-02-02 DIAGNOSIS — M5417 Radiculopathy, lumbosacral region: Secondary | ICD-10-CM | POA: Diagnosis not present

## 2015-02-02 DIAGNOSIS — M9902 Segmental and somatic dysfunction of thoracic region: Secondary | ICD-10-CM | POA: Diagnosis not present

## 2015-02-02 DIAGNOSIS — M5136 Other intervertebral disc degeneration, lumbar region: Secondary | ICD-10-CM | POA: Diagnosis not present

## 2015-02-02 DIAGNOSIS — M5414 Radiculopathy, thoracic region: Secondary | ICD-10-CM | POA: Diagnosis not present

## 2015-02-02 DIAGNOSIS — M9903 Segmental and somatic dysfunction of lumbar region: Secondary | ICD-10-CM | POA: Diagnosis not present

## 2015-02-05 DIAGNOSIS — M9902 Segmental and somatic dysfunction of thoracic region: Secondary | ICD-10-CM | POA: Diagnosis not present

## 2015-02-05 DIAGNOSIS — M5136 Other intervertebral disc degeneration, lumbar region: Secondary | ICD-10-CM | POA: Diagnosis not present

## 2015-02-05 DIAGNOSIS — M9903 Segmental and somatic dysfunction of lumbar region: Secondary | ICD-10-CM | POA: Diagnosis not present

## 2015-02-05 DIAGNOSIS — M9905 Segmental and somatic dysfunction of pelvic region: Secondary | ICD-10-CM | POA: Diagnosis not present

## 2015-02-05 DIAGNOSIS — M5414 Radiculopathy, thoracic region: Secondary | ICD-10-CM | POA: Diagnosis not present

## 2015-02-05 DIAGNOSIS — M5417 Radiculopathy, lumbosacral region: Secondary | ICD-10-CM | POA: Diagnosis not present

## 2015-02-07 DIAGNOSIS — M9903 Segmental and somatic dysfunction of lumbar region: Secondary | ICD-10-CM | POA: Diagnosis not present

## 2015-02-07 DIAGNOSIS — M5136 Other intervertebral disc degeneration, lumbar region: Secondary | ICD-10-CM | POA: Diagnosis not present

## 2015-02-07 DIAGNOSIS — M5414 Radiculopathy, thoracic region: Secondary | ICD-10-CM | POA: Diagnosis not present

## 2015-02-07 DIAGNOSIS — M5417 Radiculopathy, lumbosacral region: Secondary | ICD-10-CM | POA: Diagnosis not present

## 2015-02-07 DIAGNOSIS — M9905 Segmental and somatic dysfunction of pelvic region: Secondary | ICD-10-CM | POA: Diagnosis not present

## 2015-02-07 DIAGNOSIS — M9902 Segmental and somatic dysfunction of thoracic region: Secondary | ICD-10-CM | POA: Diagnosis not present

## 2015-02-09 DIAGNOSIS — M5417 Radiculopathy, lumbosacral region: Secondary | ICD-10-CM | POA: Diagnosis not present

## 2015-02-09 DIAGNOSIS — M9905 Segmental and somatic dysfunction of pelvic region: Secondary | ICD-10-CM | POA: Diagnosis not present

## 2015-02-09 DIAGNOSIS — M9903 Segmental and somatic dysfunction of lumbar region: Secondary | ICD-10-CM | POA: Diagnosis not present

## 2015-02-09 DIAGNOSIS — M5136 Other intervertebral disc degeneration, lumbar region: Secondary | ICD-10-CM | POA: Diagnosis not present

## 2015-02-09 DIAGNOSIS — M5414 Radiculopathy, thoracic region: Secondary | ICD-10-CM | POA: Diagnosis not present

## 2015-02-09 DIAGNOSIS — M9902 Segmental and somatic dysfunction of thoracic region: Secondary | ICD-10-CM | POA: Diagnosis not present

## 2015-02-12 DIAGNOSIS — M5136 Other intervertebral disc degeneration, lumbar region: Secondary | ICD-10-CM | POA: Diagnosis not present

## 2015-02-12 DIAGNOSIS — M9903 Segmental and somatic dysfunction of lumbar region: Secondary | ICD-10-CM | POA: Diagnosis not present

## 2015-02-12 DIAGNOSIS — M9905 Segmental and somatic dysfunction of pelvic region: Secondary | ICD-10-CM | POA: Diagnosis not present

## 2015-02-12 DIAGNOSIS — M5417 Radiculopathy, lumbosacral region: Secondary | ICD-10-CM | POA: Diagnosis not present

## 2015-02-12 DIAGNOSIS — M9902 Segmental and somatic dysfunction of thoracic region: Secondary | ICD-10-CM | POA: Diagnosis not present

## 2015-02-12 DIAGNOSIS — M5414 Radiculopathy, thoracic region: Secondary | ICD-10-CM | POA: Diagnosis not present

## 2015-02-13 DIAGNOSIS — E785 Hyperlipidemia, unspecified: Secondary | ICD-10-CM | POA: Diagnosis not present

## 2015-02-13 DIAGNOSIS — N182 Chronic kidney disease, stage 2 (mild): Secondary | ICD-10-CM | POA: Diagnosis not present

## 2015-02-13 DIAGNOSIS — R7309 Other abnormal glucose: Secondary | ICD-10-CM | POA: Diagnosis not present

## 2015-02-13 DIAGNOSIS — E538 Deficiency of other specified B group vitamins: Secondary | ICD-10-CM | POA: Diagnosis not present

## 2015-02-13 DIAGNOSIS — F17211 Nicotine dependence, cigarettes, in remission: Secondary | ICD-10-CM | POA: Diagnosis not present

## 2015-02-15 DIAGNOSIS — M5417 Radiculopathy, lumbosacral region: Secondary | ICD-10-CM | POA: Diagnosis not present

## 2015-02-15 DIAGNOSIS — M9902 Segmental and somatic dysfunction of thoracic region: Secondary | ICD-10-CM | POA: Diagnosis not present

## 2015-02-15 DIAGNOSIS — M5414 Radiculopathy, thoracic region: Secondary | ICD-10-CM | POA: Diagnosis not present

## 2015-02-15 DIAGNOSIS — M9905 Segmental and somatic dysfunction of pelvic region: Secondary | ICD-10-CM | POA: Diagnosis not present

## 2015-02-15 DIAGNOSIS — M5136 Other intervertebral disc degeneration, lumbar region: Secondary | ICD-10-CM | POA: Diagnosis not present

## 2015-02-15 DIAGNOSIS — M9903 Segmental and somatic dysfunction of lumbar region: Secondary | ICD-10-CM | POA: Diagnosis not present

## 2015-02-19 DIAGNOSIS — M9902 Segmental and somatic dysfunction of thoracic region: Secondary | ICD-10-CM | POA: Diagnosis not present

## 2015-02-19 DIAGNOSIS — M5136 Other intervertebral disc degeneration, lumbar region: Secondary | ICD-10-CM | POA: Diagnosis not present

## 2015-02-19 DIAGNOSIS — M9905 Segmental and somatic dysfunction of pelvic region: Secondary | ICD-10-CM | POA: Diagnosis not present

## 2015-02-19 DIAGNOSIS — M5414 Radiculopathy, thoracic region: Secondary | ICD-10-CM | POA: Diagnosis not present

## 2015-02-19 DIAGNOSIS — M9903 Segmental and somatic dysfunction of lumbar region: Secondary | ICD-10-CM | POA: Diagnosis not present

## 2015-02-19 DIAGNOSIS — M5417 Radiculopathy, lumbosacral region: Secondary | ICD-10-CM | POA: Diagnosis not present

## 2015-02-21 DIAGNOSIS — M9903 Segmental and somatic dysfunction of lumbar region: Secondary | ICD-10-CM | POA: Diagnosis not present

## 2015-02-21 DIAGNOSIS — M5414 Radiculopathy, thoracic region: Secondary | ICD-10-CM | POA: Diagnosis not present

## 2015-02-21 DIAGNOSIS — M5136 Other intervertebral disc degeneration, lumbar region: Secondary | ICD-10-CM | POA: Diagnosis not present

## 2015-02-21 DIAGNOSIS — M5417 Radiculopathy, lumbosacral region: Secondary | ICD-10-CM | POA: Diagnosis not present

## 2015-02-21 DIAGNOSIS — M9905 Segmental and somatic dysfunction of pelvic region: Secondary | ICD-10-CM | POA: Diagnosis not present

## 2015-02-21 DIAGNOSIS — M9902 Segmental and somatic dysfunction of thoracic region: Secondary | ICD-10-CM | POA: Diagnosis not present

## 2015-02-22 DIAGNOSIS — H1033 Unspecified acute conjunctivitis, bilateral: Secondary | ICD-10-CM | POA: Diagnosis not present

## 2015-02-28 DIAGNOSIS — M5136 Other intervertebral disc degeneration, lumbar region: Secondary | ICD-10-CM | POA: Diagnosis not present

## 2015-02-28 DIAGNOSIS — M9903 Segmental and somatic dysfunction of lumbar region: Secondary | ICD-10-CM | POA: Diagnosis not present

## 2015-02-28 DIAGNOSIS — M5414 Radiculopathy, thoracic region: Secondary | ICD-10-CM | POA: Diagnosis not present

## 2015-02-28 DIAGNOSIS — M9902 Segmental and somatic dysfunction of thoracic region: Secondary | ICD-10-CM | POA: Diagnosis not present

## 2015-02-28 DIAGNOSIS — M9905 Segmental and somatic dysfunction of pelvic region: Secondary | ICD-10-CM | POA: Diagnosis not present

## 2015-02-28 DIAGNOSIS — M5417 Radiculopathy, lumbosacral region: Secondary | ICD-10-CM | POA: Diagnosis not present

## 2015-03-02 DIAGNOSIS — M9903 Segmental and somatic dysfunction of lumbar region: Secondary | ICD-10-CM | POA: Diagnosis not present

## 2015-03-02 DIAGNOSIS — M5417 Radiculopathy, lumbosacral region: Secondary | ICD-10-CM | POA: Diagnosis not present

## 2015-03-02 DIAGNOSIS — M9905 Segmental and somatic dysfunction of pelvic region: Secondary | ICD-10-CM | POA: Diagnosis not present

## 2015-03-02 DIAGNOSIS — M5414 Radiculopathy, thoracic region: Secondary | ICD-10-CM | POA: Diagnosis not present

## 2015-03-02 DIAGNOSIS — M9902 Segmental and somatic dysfunction of thoracic region: Secondary | ICD-10-CM | POA: Diagnosis not present

## 2015-03-02 DIAGNOSIS — M5136 Other intervertebral disc degeneration, lumbar region: Secondary | ICD-10-CM | POA: Diagnosis not present

## 2015-03-08 DIAGNOSIS — M9903 Segmental and somatic dysfunction of lumbar region: Secondary | ICD-10-CM | POA: Diagnosis not present

## 2015-03-08 DIAGNOSIS — M5136 Other intervertebral disc degeneration, lumbar region: Secondary | ICD-10-CM | POA: Diagnosis not present

## 2015-03-08 DIAGNOSIS — M5414 Radiculopathy, thoracic region: Secondary | ICD-10-CM | POA: Diagnosis not present

## 2015-03-08 DIAGNOSIS — M9902 Segmental and somatic dysfunction of thoracic region: Secondary | ICD-10-CM | POA: Diagnosis not present

## 2015-03-08 DIAGNOSIS — M5417 Radiculopathy, lumbosacral region: Secondary | ICD-10-CM | POA: Diagnosis not present

## 2015-03-08 DIAGNOSIS — M9905 Segmental and somatic dysfunction of pelvic region: Secondary | ICD-10-CM | POA: Diagnosis not present

## 2015-03-15 DIAGNOSIS — M9905 Segmental and somatic dysfunction of pelvic region: Secondary | ICD-10-CM | POA: Diagnosis not present

## 2015-03-15 DIAGNOSIS — M5414 Radiculopathy, thoracic region: Secondary | ICD-10-CM | POA: Diagnosis not present

## 2015-03-15 DIAGNOSIS — M5136 Other intervertebral disc degeneration, lumbar region: Secondary | ICD-10-CM | POA: Diagnosis not present

## 2015-03-15 DIAGNOSIS — M5417 Radiculopathy, lumbosacral region: Secondary | ICD-10-CM | POA: Diagnosis not present

## 2015-03-15 DIAGNOSIS — M9903 Segmental and somatic dysfunction of lumbar region: Secondary | ICD-10-CM | POA: Diagnosis not present

## 2015-03-15 DIAGNOSIS — M9902 Segmental and somatic dysfunction of thoracic region: Secondary | ICD-10-CM | POA: Diagnosis not present

## 2015-03-22 DIAGNOSIS — M9902 Segmental and somatic dysfunction of thoracic region: Secondary | ICD-10-CM | POA: Diagnosis not present

## 2015-03-22 DIAGNOSIS — M9903 Segmental and somatic dysfunction of lumbar region: Secondary | ICD-10-CM | POA: Diagnosis not present

## 2015-03-22 DIAGNOSIS — M5414 Radiculopathy, thoracic region: Secondary | ICD-10-CM | POA: Diagnosis not present

## 2015-03-22 DIAGNOSIS — M9905 Segmental and somatic dysfunction of pelvic region: Secondary | ICD-10-CM | POA: Diagnosis not present

## 2015-03-22 DIAGNOSIS — M5136 Other intervertebral disc degeneration, lumbar region: Secondary | ICD-10-CM | POA: Diagnosis not present

## 2015-03-22 DIAGNOSIS — M5417 Radiculopathy, lumbosacral region: Secondary | ICD-10-CM | POA: Diagnosis not present

## 2015-03-29 DIAGNOSIS — H25011 Cortical age-related cataract, right eye: Secondary | ICD-10-CM | POA: Diagnosis not present

## 2015-03-29 DIAGNOSIS — H2511 Age-related nuclear cataract, right eye: Secondary | ICD-10-CM | POA: Diagnosis not present

## 2015-03-29 DIAGNOSIS — H25811 Combined forms of age-related cataract, right eye: Secondary | ICD-10-CM | POA: Diagnosis not present

## 2015-03-29 DIAGNOSIS — H2181 Floppy iris syndrome: Secondary | ICD-10-CM | POA: Diagnosis not present

## 2015-03-29 DIAGNOSIS — H52201 Unspecified astigmatism, right eye: Secondary | ICD-10-CM | POA: Diagnosis not present

## 2015-03-29 DIAGNOSIS — H268 Other specified cataract: Secondary | ICD-10-CM | POA: Diagnosis not present

## 2015-04-12 DIAGNOSIS — M9903 Segmental and somatic dysfunction of lumbar region: Secondary | ICD-10-CM | POA: Diagnosis not present

## 2015-04-12 DIAGNOSIS — M5417 Radiculopathy, lumbosacral region: Secondary | ICD-10-CM | POA: Diagnosis not present

## 2015-04-12 DIAGNOSIS — M5414 Radiculopathy, thoracic region: Secondary | ICD-10-CM | POA: Diagnosis not present

## 2015-04-12 DIAGNOSIS — M9905 Segmental and somatic dysfunction of pelvic region: Secondary | ICD-10-CM | POA: Diagnosis not present

## 2015-04-12 DIAGNOSIS — M9902 Segmental and somatic dysfunction of thoracic region: Secondary | ICD-10-CM | POA: Diagnosis not present

## 2015-04-12 DIAGNOSIS — M5136 Other intervertebral disc degeneration, lumbar region: Secondary | ICD-10-CM | POA: Diagnosis not present

## 2015-04-19 DIAGNOSIS — H25012 Cortical age-related cataract, left eye: Secondary | ICD-10-CM | POA: Diagnosis not present

## 2015-04-19 DIAGNOSIS — H2512 Age-related nuclear cataract, left eye: Secondary | ICD-10-CM | POA: Diagnosis not present

## 2015-04-19 DIAGNOSIS — H25812 Combined forms of age-related cataract, left eye: Secondary | ICD-10-CM | POA: Diagnosis not present

## 2015-04-19 DIAGNOSIS — H52202 Unspecified astigmatism, left eye: Secondary | ICD-10-CM | POA: Diagnosis not present

## 2015-04-23 DIAGNOSIS — R030 Elevated blood-pressure reading, without diagnosis of hypertension: Secondary | ICD-10-CM | POA: Diagnosis not present

## 2015-05-23 DIAGNOSIS — L820 Inflamed seborrheic keratosis: Secondary | ICD-10-CM | POA: Diagnosis not present

## 2015-05-23 DIAGNOSIS — X32XXXD Exposure to sunlight, subsequent encounter: Secondary | ICD-10-CM | POA: Diagnosis not present

## 2015-05-23 DIAGNOSIS — Z08 Encounter for follow-up examination after completed treatment for malignant neoplasm: Secondary | ICD-10-CM | POA: Diagnosis not present

## 2015-05-23 DIAGNOSIS — L570 Actinic keratosis: Secondary | ICD-10-CM | POA: Diagnosis not present

## 2015-05-23 DIAGNOSIS — Z1283 Encounter for screening for malignant neoplasm of skin: Secondary | ICD-10-CM | POA: Diagnosis not present

## 2015-05-23 DIAGNOSIS — Z85820 Personal history of malignant melanoma of skin: Secondary | ICD-10-CM | POA: Diagnosis not present

## 2015-07-12 DIAGNOSIS — M9905 Segmental and somatic dysfunction of pelvic region: Secondary | ICD-10-CM | POA: Diagnosis not present

## 2015-07-12 DIAGNOSIS — M9903 Segmental and somatic dysfunction of lumbar region: Secondary | ICD-10-CM | POA: Diagnosis not present

## 2015-07-12 DIAGNOSIS — M5417 Radiculopathy, lumbosacral region: Secondary | ICD-10-CM | POA: Diagnosis not present

## 2015-07-12 DIAGNOSIS — M5136 Other intervertebral disc degeneration, lumbar region: Secondary | ICD-10-CM | POA: Diagnosis not present

## 2015-07-12 DIAGNOSIS — M5414 Radiculopathy, thoracic region: Secondary | ICD-10-CM | POA: Diagnosis not present

## 2015-07-12 DIAGNOSIS — M9902 Segmental and somatic dysfunction of thoracic region: Secondary | ICD-10-CM | POA: Diagnosis not present

## 2015-08-07 DIAGNOSIS — E538 Deficiency of other specified B group vitamins: Secondary | ICD-10-CM | POA: Diagnosis not present

## 2015-08-07 DIAGNOSIS — E785 Hyperlipidemia, unspecified: Secondary | ICD-10-CM | POA: Diagnosis not present

## 2015-08-07 DIAGNOSIS — E559 Vitamin D deficiency, unspecified: Secondary | ICD-10-CM | POA: Diagnosis not present

## 2015-08-14 DIAGNOSIS — F17211 Nicotine dependence, cigarettes, in remission: Secondary | ICD-10-CM | POA: Diagnosis not present

## 2015-08-14 DIAGNOSIS — N182 Chronic kidney disease, stage 2 (mild): Secondary | ICD-10-CM | POA: Diagnosis not present

## 2015-08-14 DIAGNOSIS — E559 Vitamin D deficiency, unspecified: Secondary | ICD-10-CM | POA: Diagnosis not present

## 2015-08-14 DIAGNOSIS — E785 Hyperlipidemia, unspecified: Secondary | ICD-10-CM | POA: Diagnosis not present

## 2015-08-14 DIAGNOSIS — E538 Deficiency of other specified B group vitamins: Secondary | ICD-10-CM | POA: Diagnosis not present

## 2015-08-15 DIAGNOSIS — L820 Inflamed seborrheic keratosis: Secondary | ICD-10-CM | POA: Diagnosis not present

## 2015-08-15 DIAGNOSIS — C44311 Basal cell carcinoma of skin of nose: Secondary | ICD-10-CM | POA: Diagnosis not present

## 2015-08-15 DIAGNOSIS — D040 Carcinoma in situ of skin of lip: Secondary | ICD-10-CM | POA: Diagnosis not present

## 2015-08-20 DIAGNOSIS — M9902 Segmental and somatic dysfunction of thoracic region: Secondary | ICD-10-CM | POA: Diagnosis not present

## 2015-08-20 DIAGNOSIS — M5136 Other intervertebral disc degeneration, lumbar region: Secondary | ICD-10-CM | POA: Diagnosis not present

## 2015-08-20 DIAGNOSIS — M9905 Segmental and somatic dysfunction of pelvic region: Secondary | ICD-10-CM | POA: Diagnosis not present

## 2015-08-20 DIAGNOSIS — M9903 Segmental and somatic dysfunction of lumbar region: Secondary | ICD-10-CM | POA: Diagnosis not present

## 2015-08-20 DIAGNOSIS — M5414 Radiculopathy, thoracic region: Secondary | ICD-10-CM | POA: Diagnosis not present

## 2015-08-20 DIAGNOSIS — M5417 Radiculopathy, lumbosacral region: Secondary | ICD-10-CM | POA: Diagnosis not present

## 2015-08-29 DIAGNOSIS — X32XXXD Exposure to sunlight, subsequent encounter: Secondary | ICD-10-CM | POA: Diagnosis not present

## 2015-08-29 DIAGNOSIS — L570 Actinic keratosis: Secondary | ICD-10-CM | POA: Diagnosis not present

## 2015-08-29 DIAGNOSIS — D040 Carcinoma in situ of skin of lip: Secondary | ICD-10-CM | POA: Diagnosis not present

## 2015-09-06 DIAGNOSIS — M5136 Other intervertebral disc degeneration, lumbar region: Secondary | ICD-10-CM | POA: Diagnosis not present

## 2015-09-06 DIAGNOSIS — M9903 Segmental and somatic dysfunction of lumbar region: Secondary | ICD-10-CM | POA: Diagnosis not present

## 2015-09-06 DIAGNOSIS — M5417 Radiculopathy, lumbosacral region: Secondary | ICD-10-CM | POA: Diagnosis not present

## 2015-09-06 DIAGNOSIS — M9905 Segmental and somatic dysfunction of pelvic region: Secondary | ICD-10-CM | POA: Diagnosis not present

## 2015-09-06 DIAGNOSIS — M9902 Segmental and somatic dysfunction of thoracic region: Secondary | ICD-10-CM | POA: Diagnosis not present

## 2015-09-06 DIAGNOSIS — M5414 Radiculopathy, thoracic region: Secondary | ICD-10-CM | POA: Diagnosis not present

## 2015-09-11 DIAGNOSIS — Z85828 Personal history of other malignant neoplasm of skin: Secondary | ICD-10-CM | POA: Diagnosis not present

## 2015-09-11 DIAGNOSIS — Z08 Encounter for follow-up examination after completed treatment for malignant neoplasm: Secondary | ICD-10-CM | POA: Diagnosis not present

## 2015-09-17 DIAGNOSIS — M9903 Segmental and somatic dysfunction of lumbar region: Secondary | ICD-10-CM | POA: Diagnosis not present

## 2015-09-17 DIAGNOSIS — M5414 Radiculopathy, thoracic region: Secondary | ICD-10-CM | POA: Diagnosis not present

## 2015-09-17 DIAGNOSIS — M5417 Radiculopathy, lumbosacral region: Secondary | ICD-10-CM | POA: Diagnosis not present

## 2015-09-17 DIAGNOSIS — M9902 Segmental and somatic dysfunction of thoracic region: Secondary | ICD-10-CM | POA: Diagnosis not present

## 2015-09-17 DIAGNOSIS — M9905 Segmental and somatic dysfunction of pelvic region: Secondary | ICD-10-CM | POA: Diagnosis not present

## 2015-09-17 DIAGNOSIS — M5136 Other intervertebral disc degeneration, lumbar region: Secondary | ICD-10-CM | POA: Diagnosis not present

## 2015-10-15 DIAGNOSIS — M9902 Segmental and somatic dysfunction of thoracic region: Secondary | ICD-10-CM | POA: Diagnosis not present

## 2015-10-15 DIAGNOSIS — M5136 Other intervertebral disc degeneration, lumbar region: Secondary | ICD-10-CM | POA: Diagnosis not present

## 2015-10-15 DIAGNOSIS — M5414 Radiculopathy, thoracic region: Secondary | ICD-10-CM | POA: Diagnosis not present

## 2015-10-15 DIAGNOSIS — M5417 Radiculopathy, lumbosacral region: Secondary | ICD-10-CM | POA: Diagnosis not present

## 2015-10-15 DIAGNOSIS — M9903 Segmental and somatic dysfunction of lumbar region: Secondary | ICD-10-CM | POA: Diagnosis not present

## 2015-10-15 DIAGNOSIS — M9905 Segmental and somatic dysfunction of pelvic region: Secondary | ICD-10-CM | POA: Diagnosis not present

## 2015-10-23 DIAGNOSIS — L820 Inflamed seborrheic keratosis: Secondary | ICD-10-CM | POA: Diagnosis not present

## 2015-10-23 DIAGNOSIS — Z08 Encounter for follow-up examination after completed treatment for malignant neoplasm: Secondary | ICD-10-CM | POA: Diagnosis not present

## 2015-10-23 DIAGNOSIS — Z85828 Personal history of other malignant neoplasm of skin: Secondary | ICD-10-CM | POA: Diagnosis not present

## 2015-10-23 DIAGNOSIS — Z23 Encounter for immunization: Secondary | ICD-10-CM | POA: Diagnosis not present

## 2015-11-13 DIAGNOSIS — M9905 Segmental and somatic dysfunction of pelvic region: Secondary | ICD-10-CM | POA: Diagnosis not present

## 2015-11-13 DIAGNOSIS — M9902 Segmental and somatic dysfunction of thoracic region: Secondary | ICD-10-CM | POA: Diagnosis not present

## 2015-11-13 DIAGNOSIS — M5417 Radiculopathy, lumbosacral region: Secondary | ICD-10-CM | POA: Diagnosis not present

## 2015-11-13 DIAGNOSIS — M5136 Other intervertebral disc degeneration, lumbar region: Secondary | ICD-10-CM | POA: Diagnosis not present

## 2015-11-13 DIAGNOSIS — M5414 Radiculopathy, thoracic region: Secondary | ICD-10-CM | POA: Diagnosis not present

## 2015-11-13 DIAGNOSIS — M9903 Segmental and somatic dysfunction of lumbar region: Secondary | ICD-10-CM | POA: Diagnosis not present

## 2015-12-11 DIAGNOSIS — M5136 Other intervertebral disc degeneration, lumbar region: Secondary | ICD-10-CM | POA: Diagnosis not present

## 2015-12-11 DIAGNOSIS — M5414 Radiculopathy, thoracic region: Secondary | ICD-10-CM | POA: Diagnosis not present

## 2015-12-11 DIAGNOSIS — M9903 Segmental and somatic dysfunction of lumbar region: Secondary | ICD-10-CM | POA: Diagnosis not present

## 2015-12-11 DIAGNOSIS — M9902 Segmental and somatic dysfunction of thoracic region: Secondary | ICD-10-CM | POA: Diagnosis not present

## 2015-12-11 DIAGNOSIS — M5417 Radiculopathy, lumbosacral region: Secondary | ICD-10-CM | POA: Diagnosis not present

## 2015-12-11 DIAGNOSIS — M9905 Segmental and somatic dysfunction of pelvic region: Secondary | ICD-10-CM | POA: Diagnosis not present

## 2016-01-08 DIAGNOSIS — M9905 Segmental and somatic dysfunction of pelvic region: Secondary | ICD-10-CM | POA: Diagnosis not present

## 2016-01-08 DIAGNOSIS — M5136 Other intervertebral disc degeneration, lumbar region: Secondary | ICD-10-CM | POA: Diagnosis not present

## 2016-01-08 DIAGNOSIS — M5417 Radiculopathy, lumbosacral region: Secondary | ICD-10-CM | POA: Diagnosis not present

## 2016-01-08 DIAGNOSIS — M5414 Radiculopathy, thoracic region: Secondary | ICD-10-CM | POA: Diagnosis not present

## 2016-01-08 DIAGNOSIS — M9902 Segmental and somatic dysfunction of thoracic region: Secondary | ICD-10-CM | POA: Diagnosis not present

## 2016-01-08 DIAGNOSIS — M9903 Segmental and somatic dysfunction of lumbar region: Secondary | ICD-10-CM | POA: Diagnosis not present

## 2016-01-21 DIAGNOSIS — H532 Diplopia: Secondary | ICD-10-CM | POA: Diagnosis not present

## 2016-02-05 DIAGNOSIS — M5136 Other intervertebral disc degeneration, lumbar region: Secondary | ICD-10-CM | POA: Diagnosis not present

## 2016-02-05 DIAGNOSIS — M9903 Segmental and somatic dysfunction of lumbar region: Secondary | ICD-10-CM | POA: Diagnosis not present

## 2016-02-05 DIAGNOSIS — M5414 Radiculopathy, thoracic region: Secondary | ICD-10-CM | POA: Diagnosis not present

## 2016-02-05 DIAGNOSIS — M9905 Segmental and somatic dysfunction of pelvic region: Secondary | ICD-10-CM | POA: Diagnosis not present

## 2016-02-05 DIAGNOSIS — M5417 Radiculopathy, lumbosacral region: Secondary | ICD-10-CM | POA: Diagnosis not present

## 2016-02-05 DIAGNOSIS — M9902 Segmental and somatic dysfunction of thoracic region: Secondary | ICD-10-CM | POA: Diagnosis not present

## 2016-03-04 DIAGNOSIS — D126 Benign neoplasm of colon, unspecified: Secondary | ICD-10-CM | POA: Diagnosis not present

## 2016-03-04 DIAGNOSIS — E668 Other obesity: Secondary | ICD-10-CM | POA: Diagnosis not present

## 2016-03-04 DIAGNOSIS — Z125 Encounter for screening for malignant neoplasm of prostate: Secondary | ICD-10-CM | POA: Diagnosis not present

## 2016-03-04 DIAGNOSIS — Z0000 Encounter for general adult medical examination without abnormal findings: Secondary | ICD-10-CM | POA: Diagnosis not present

## 2016-03-04 DIAGNOSIS — Z6831 Body mass index (BMI) 31.0-31.9, adult: Secondary | ICD-10-CM | POA: Diagnosis not present

## 2016-03-04 DIAGNOSIS — E7800 Pure hypercholesterolemia, unspecified: Secondary | ICD-10-CM | POA: Diagnosis not present

## 2016-03-11 DIAGNOSIS — M9902 Segmental and somatic dysfunction of thoracic region: Secondary | ICD-10-CM | POA: Diagnosis not present

## 2016-03-11 DIAGNOSIS — M5414 Radiculopathy, thoracic region: Secondary | ICD-10-CM | POA: Diagnosis not present

## 2016-03-11 DIAGNOSIS — M5136 Other intervertebral disc degeneration, lumbar region: Secondary | ICD-10-CM | POA: Diagnosis not present

## 2016-03-11 DIAGNOSIS — M9903 Segmental and somatic dysfunction of lumbar region: Secondary | ICD-10-CM | POA: Diagnosis not present

## 2016-03-11 DIAGNOSIS — M9905 Segmental and somatic dysfunction of pelvic region: Secondary | ICD-10-CM | POA: Diagnosis not present

## 2016-03-11 DIAGNOSIS — M5417 Radiculopathy, lumbosacral region: Secondary | ICD-10-CM | POA: Diagnosis not present

## 2016-03-31 DIAGNOSIS — Z86010 Personal history of colonic polyps: Secondary | ICD-10-CM | POA: Diagnosis not present

## 2016-04-08 DIAGNOSIS — M5414 Radiculopathy, thoracic region: Secondary | ICD-10-CM | POA: Diagnosis not present

## 2016-04-08 DIAGNOSIS — M5136 Other intervertebral disc degeneration, lumbar region: Secondary | ICD-10-CM | POA: Diagnosis not present

## 2016-04-08 DIAGNOSIS — M9902 Segmental and somatic dysfunction of thoracic region: Secondary | ICD-10-CM | POA: Diagnosis not present

## 2016-04-08 DIAGNOSIS — M5417 Radiculopathy, lumbosacral region: Secondary | ICD-10-CM | POA: Diagnosis not present

## 2016-04-08 DIAGNOSIS — M9903 Segmental and somatic dysfunction of lumbar region: Secondary | ICD-10-CM | POA: Diagnosis not present

## 2016-04-08 DIAGNOSIS — M9905 Segmental and somatic dysfunction of pelvic region: Secondary | ICD-10-CM | POA: Diagnosis not present

## 2016-05-02 DIAGNOSIS — L570 Actinic keratosis: Secondary | ICD-10-CM | POA: Diagnosis not present

## 2016-05-02 DIAGNOSIS — Z85820 Personal history of malignant melanoma of skin: Secondary | ICD-10-CM | POA: Diagnosis not present

## 2016-05-02 DIAGNOSIS — Z08 Encounter for follow-up examination after completed treatment for malignant neoplasm: Secondary | ICD-10-CM | POA: Diagnosis not present

## 2016-05-02 DIAGNOSIS — X32XXXA Exposure to sunlight, initial encounter: Secondary | ICD-10-CM | POA: Diagnosis not present

## 2016-05-02 DIAGNOSIS — L820 Inflamed seborrheic keratosis: Secondary | ICD-10-CM | POA: Diagnosis not present

## 2016-05-02 DIAGNOSIS — L859 Epidermal thickening, unspecified: Secondary | ICD-10-CM | POA: Diagnosis not present

## 2016-05-02 DIAGNOSIS — Z1283 Encounter for screening for malignant neoplasm of skin: Secondary | ICD-10-CM | POA: Diagnosis not present

## 2016-05-02 DIAGNOSIS — D225 Melanocytic nevi of trunk: Secondary | ICD-10-CM | POA: Diagnosis not present

## 2016-05-05 DIAGNOSIS — M5414 Radiculopathy, thoracic region: Secondary | ICD-10-CM | POA: Diagnosis not present

## 2016-05-05 DIAGNOSIS — M9902 Segmental and somatic dysfunction of thoracic region: Secondary | ICD-10-CM | POA: Diagnosis not present

## 2016-05-05 DIAGNOSIS — M5136 Other intervertebral disc degeneration, lumbar region: Secondary | ICD-10-CM | POA: Diagnosis not present

## 2016-05-05 DIAGNOSIS — M9903 Segmental and somatic dysfunction of lumbar region: Secondary | ICD-10-CM | POA: Diagnosis not present

## 2016-05-05 DIAGNOSIS — M5417 Radiculopathy, lumbosacral region: Secondary | ICD-10-CM | POA: Diagnosis not present

## 2016-05-05 DIAGNOSIS — M9905 Segmental and somatic dysfunction of pelvic region: Secondary | ICD-10-CM | POA: Diagnosis not present

## 2016-05-08 DIAGNOSIS — M5136 Other intervertebral disc degeneration, lumbar region: Secondary | ICD-10-CM | POA: Diagnosis not present

## 2016-05-08 DIAGNOSIS — M9905 Segmental and somatic dysfunction of pelvic region: Secondary | ICD-10-CM | POA: Diagnosis not present

## 2016-05-08 DIAGNOSIS — M5414 Radiculopathy, thoracic region: Secondary | ICD-10-CM | POA: Diagnosis not present

## 2016-05-08 DIAGNOSIS — M5417 Radiculopathy, lumbosacral region: Secondary | ICD-10-CM | POA: Diagnosis not present

## 2016-05-08 DIAGNOSIS — M9902 Segmental and somatic dysfunction of thoracic region: Secondary | ICD-10-CM | POA: Diagnosis not present

## 2016-05-08 DIAGNOSIS — M9903 Segmental and somatic dysfunction of lumbar region: Secondary | ICD-10-CM | POA: Diagnosis not present

## 2016-05-12 DIAGNOSIS — M9903 Segmental and somatic dysfunction of lumbar region: Secondary | ICD-10-CM | POA: Diagnosis not present

## 2016-05-12 DIAGNOSIS — M9905 Segmental and somatic dysfunction of pelvic region: Secondary | ICD-10-CM | POA: Diagnosis not present

## 2016-05-12 DIAGNOSIS — M5417 Radiculopathy, lumbosacral region: Secondary | ICD-10-CM | POA: Diagnosis not present

## 2016-05-12 DIAGNOSIS — M5136 Other intervertebral disc degeneration, lumbar region: Secondary | ICD-10-CM | POA: Diagnosis not present

## 2016-05-12 DIAGNOSIS — M9902 Segmental and somatic dysfunction of thoracic region: Secondary | ICD-10-CM | POA: Diagnosis not present

## 2016-05-12 DIAGNOSIS — M5414 Radiculopathy, thoracic region: Secondary | ICD-10-CM | POA: Diagnosis not present

## 2016-05-13 DIAGNOSIS — Z86010 Personal history of colonic polyps: Secondary | ICD-10-CM | POA: Diagnosis not present

## 2016-05-13 DIAGNOSIS — K640 First degree hemorrhoids: Secondary | ICD-10-CM | POA: Diagnosis not present

## 2016-05-13 DIAGNOSIS — D126 Benign neoplasm of colon, unspecified: Secondary | ICD-10-CM | POA: Diagnosis not present

## 2016-05-14 DIAGNOSIS — M5136 Other intervertebral disc degeneration, lumbar region: Secondary | ICD-10-CM | POA: Diagnosis not present

## 2016-05-14 DIAGNOSIS — M5414 Radiculopathy, thoracic region: Secondary | ICD-10-CM | POA: Diagnosis not present

## 2016-05-14 DIAGNOSIS — M9905 Segmental and somatic dysfunction of pelvic region: Secondary | ICD-10-CM | POA: Diagnosis not present

## 2016-05-14 DIAGNOSIS — M9902 Segmental and somatic dysfunction of thoracic region: Secondary | ICD-10-CM | POA: Diagnosis not present

## 2016-05-14 DIAGNOSIS — M5417 Radiculopathy, lumbosacral region: Secondary | ICD-10-CM | POA: Diagnosis not present

## 2016-05-14 DIAGNOSIS — M9903 Segmental and somatic dysfunction of lumbar region: Secondary | ICD-10-CM | POA: Diagnosis not present

## 2016-05-15 DIAGNOSIS — D126 Benign neoplasm of colon, unspecified: Secondary | ICD-10-CM | POA: Diagnosis not present

## 2016-05-19 DIAGNOSIS — H532 Diplopia: Secondary | ICD-10-CM | POA: Diagnosis not present

## 2016-05-21 DIAGNOSIS — M5417 Radiculopathy, lumbosacral region: Secondary | ICD-10-CM | POA: Diagnosis not present

## 2016-05-21 DIAGNOSIS — M5136 Other intervertebral disc degeneration, lumbar region: Secondary | ICD-10-CM | POA: Diagnosis not present

## 2016-05-21 DIAGNOSIS — M5414 Radiculopathy, thoracic region: Secondary | ICD-10-CM | POA: Diagnosis not present

## 2016-05-21 DIAGNOSIS — M9902 Segmental and somatic dysfunction of thoracic region: Secondary | ICD-10-CM | POA: Diagnosis not present

## 2016-05-21 DIAGNOSIS — M9903 Segmental and somatic dysfunction of lumbar region: Secondary | ICD-10-CM | POA: Diagnosis not present

## 2016-05-21 DIAGNOSIS — M9905 Segmental and somatic dysfunction of pelvic region: Secondary | ICD-10-CM | POA: Diagnosis not present

## 2016-05-29 DIAGNOSIS — M5414 Radiculopathy, thoracic region: Secondary | ICD-10-CM | POA: Diagnosis not present

## 2016-05-29 DIAGNOSIS — M9902 Segmental and somatic dysfunction of thoracic region: Secondary | ICD-10-CM | POA: Diagnosis not present

## 2016-05-29 DIAGNOSIS — M9903 Segmental and somatic dysfunction of lumbar region: Secondary | ICD-10-CM | POA: Diagnosis not present

## 2016-05-29 DIAGNOSIS — M9905 Segmental and somatic dysfunction of pelvic region: Secondary | ICD-10-CM | POA: Diagnosis not present

## 2016-05-29 DIAGNOSIS — M5136 Other intervertebral disc degeneration, lumbar region: Secondary | ICD-10-CM | POA: Diagnosis not present

## 2016-05-29 DIAGNOSIS — M5417 Radiculopathy, lumbosacral region: Secondary | ICD-10-CM | POA: Diagnosis not present

## 2016-06-02 DIAGNOSIS — M5136 Other intervertebral disc degeneration, lumbar region: Secondary | ICD-10-CM | POA: Diagnosis not present

## 2016-06-02 DIAGNOSIS — M5417 Radiculopathy, lumbosacral region: Secondary | ICD-10-CM | POA: Diagnosis not present

## 2016-06-02 DIAGNOSIS — M9905 Segmental and somatic dysfunction of pelvic region: Secondary | ICD-10-CM | POA: Diagnosis not present

## 2016-06-02 DIAGNOSIS — M9903 Segmental and somatic dysfunction of lumbar region: Secondary | ICD-10-CM | POA: Diagnosis not present

## 2016-06-02 DIAGNOSIS — M5414 Radiculopathy, thoracic region: Secondary | ICD-10-CM | POA: Diagnosis not present

## 2016-06-02 DIAGNOSIS — M9902 Segmental and somatic dysfunction of thoracic region: Secondary | ICD-10-CM | POA: Diagnosis not present

## 2016-06-05 DIAGNOSIS — M9905 Segmental and somatic dysfunction of pelvic region: Secondary | ICD-10-CM | POA: Diagnosis not present

## 2016-06-05 DIAGNOSIS — M5417 Radiculopathy, lumbosacral region: Secondary | ICD-10-CM | POA: Diagnosis not present

## 2016-06-05 DIAGNOSIS — M5414 Radiculopathy, thoracic region: Secondary | ICD-10-CM | POA: Diagnosis not present

## 2016-06-05 DIAGNOSIS — M9902 Segmental and somatic dysfunction of thoracic region: Secondary | ICD-10-CM | POA: Diagnosis not present

## 2016-06-05 DIAGNOSIS — M9903 Segmental and somatic dysfunction of lumbar region: Secondary | ICD-10-CM | POA: Diagnosis not present

## 2016-06-05 DIAGNOSIS — M5136 Other intervertebral disc degeneration, lumbar region: Secondary | ICD-10-CM | POA: Diagnosis not present

## 2016-06-10 DIAGNOSIS — M9903 Segmental and somatic dysfunction of lumbar region: Secondary | ICD-10-CM | POA: Diagnosis not present

## 2016-06-10 DIAGNOSIS — M9905 Segmental and somatic dysfunction of pelvic region: Secondary | ICD-10-CM | POA: Diagnosis not present

## 2016-06-10 DIAGNOSIS — M5417 Radiculopathy, lumbosacral region: Secondary | ICD-10-CM | POA: Diagnosis not present

## 2016-06-10 DIAGNOSIS — L570 Actinic keratosis: Secondary | ICD-10-CM | POA: Diagnosis not present

## 2016-06-10 DIAGNOSIS — M9902 Segmental and somatic dysfunction of thoracic region: Secondary | ICD-10-CM | POA: Diagnosis not present

## 2016-06-10 DIAGNOSIS — X32XXXD Exposure to sunlight, subsequent encounter: Secondary | ICD-10-CM | POA: Diagnosis not present

## 2016-06-10 DIAGNOSIS — M5136 Other intervertebral disc degeneration, lumbar region: Secondary | ICD-10-CM | POA: Diagnosis not present

## 2016-06-10 DIAGNOSIS — M5414 Radiculopathy, thoracic region: Secondary | ICD-10-CM | POA: Diagnosis not present

## 2016-06-13 DIAGNOSIS — M9902 Segmental and somatic dysfunction of thoracic region: Secondary | ICD-10-CM | POA: Diagnosis not present

## 2016-06-13 DIAGNOSIS — M9905 Segmental and somatic dysfunction of pelvic region: Secondary | ICD-10-CM | POA: Diagnosis not present

## 2016-06-13 DIAGNOSIS — M5417 Radiculopathy, lumbosacral region: Secondary | ICD-10-CM | POA: Diagnosis not present

## 2016-06-13 DIAGNOSIS — M9903 Segmental and somatic dysfunction of lumbar region: Secondary | ICD-10-CM | POA: Diagnosis not present

## 2016-06-13 DIAGNOSIS — M5136 Other intervertebral disc degeneration, lumbar region: Secondary | ICD-10-CM | POA: Diagnosis not present

## 2016-06-13 DIAGNOSIS — M5414 Radiculopathy, thoracic region: Secondary | ICD-10-CM | POA: Diagnosis not present

## 2016-06-16 DIAGNOSIS — M9902 Segmental and somatic dysfunction of thoracic region: Secondary | ICD-10-CM | POA: Diagnosis not present

## 2016-06-16 DIAGNOSIS — M9903 Segmental and somatic dysfunction of lumbar region: Secondary | ICD-10-CM | POA: Diagnosis not present

## 2016-06-16 DIAGNOSIS — M5136 Other intervertebral disc degeneration, lumbar region: Secondary | ICD-10-CM | POA: Diagnosis not present

## 2016-06-16 DIAGNOSIS — M5417 Radiculopathy, lumbosacral region: Secondary | ICD-10-CM | POA: Diagnosis not present

## 2016-06-16 DIAGNOSIS — M5414 Radiculopathy, thoracic region: Secondary | ICD-10-CM | POA: Diagnosis not present

## 2016-06-16 DIAGNOSIS — M9905 Segmental and somatic dysfunction of pelvic region: Secondary | ICD-10-CM | POA: Diagnosis not present

## 2016-06-19 DIAGNOSIS — M9902 Segmental and somatic dysfunction of thoracic region: Secondary | ICD-10-CM | POA: Diagnosis not present

## 2016-06-19 DIAGNOSIS — M9905 Segmental and somatic dysfunction of pelvic region: Secondary | ICD-10-CM | POA: Diagnosis not present

## 2016-06-19 DIAGNOSIS — M5414 Radiculopathy, thoracic region: Secondary | ICD-10-CM | POA: Diagnosis not present

## 2016-06-19 DIAGNOSIS — M5136 Other intervertebral disc degeneration, lumbar region: Secondary | ICD-10-CM | POA: Diagnosis not present

## 2016-06-19 DIAGNOSIS — M5417 Radiculopathy, lumbosacral region: Secondary | ICD-10-CM | POA: Diagnosis not present

## 2016-06-19 DIAGNOSIS — M9903 Segmental and somatic dysfunction of lumbar region: Secondary | ICD-10-CM | POA: Diagnosis not present

## 2016-06-25 DIAGNOSIS — M9903 Segmental and somatic dysfunction of lumbar region: Secondary | ICD-10-CM | POA: Diagnosis not present

## 2016-06-25 DIAGNOSIS — M5417 Radiculopathy, lumbosacral region: Secondary | ICD-10-CM | POA: Diagnosis not present

## 2016-06-25 DIAGNOSIS — M5136 Other intervertebral disc degeneration, lumbar region: Secondary | ICD-10-CM | POA: Diagnosis not present

## 2016-06-25 DIAGNOSIS — M5414 Radiculopathy, thoracic region: Secondary | ICD-10-CM | POA: Diagnosis not present

## 2016-06-25 DIAGNOSIS — M9902 Segmental and somatic dysfunction of thoracic region: Secondary | ICD-10-CM | POA: Diagnosis not present

## 2016-06-25 DIAGNOSIS — M9905 Segmental and somatic dysfunction of pelvic region: Secondary | ICD-10-CM | POA: Diagnosis not present

## 2016-07-09 DIAGNOSIS — M9903 Segmental and somatic dysfunction of lumbar region: Secondary | ICD-10-CM | POA: Diagnosis not present

## 2016-07-09 DIAGNOSIS — M9902 Segmental and somatic dysfunction of thoracic region: Secondary | ICD-10-CM | POA: Diagnosis not present

## 2016-07-09 DIAGNOSIS — M5414 Radiculopathy, thoracic region: Secondary | ICD-10-CM | POA: Diagnosis not present

## 2016-07-09 DIAGNOSIS — M9905 Segmental and somatic dysfunction of pelvic region: Secondary | ICD-10-CM | POA: Diagnosis not present

## 2016-07-09 DIAGNOSIS — M5136 Other intervertebral disc degeneration, lumbar region: Secondary | ICD-10-CM | POA: Diagnosis not present

## 2016-07-09 DIAGNOSIS — M5417 Radiculopathy, lumbosacral region: Secondary | ICD-10-CM | POA: Diagnosis not present

## 2016-07-23 DIAGNOSIS — M9903 Segmental and somatic dysfunction of lumbar region: Secondary | ICD-10-CM | POA: Diagnosis not present

## 2016-07-23 DIAGNOSIS — M9905 Segmental and somatic dysfunction of pelvic region: Secondary | ICD-10-CM | POA: Diagnosis not present

## 2016-07-23 DIAGNOSIS — M5136 Other intervertebral disc degeneration, lumbar region: Secondary | ICD-10-CM | POA: Diagnosis not present

## 2016-07-23 DIAGNOSIS — M5414 Radiculopathy, thoracic region: Secondary | ICD-10-CM | POA: Diagnosis not present

## 2016-07-23 DIAGNOSIS — M5417 Radiculopathy, lumbosacral region: Secondary | ICD-10-CM | POA: Diagnosis not present

## 2016-07-23 DIAGNOSIS — M9902 Segmental and somatic dysfunction of thoracic region: Secondary | ICD-10-CM | POA: Diagnosis not present

## 2016-07-30 DIAGNOSIS — M5136 Other intervertebral disc degeneration, lumbar region: Secondary | ICD-10-CM | POA: Diagnosis not present

## 2016-07-30 DIAGNOSIS — M5417 Radiculopathy, lumbosacral region: Secondary | ICD-10-CM | POA: Diagnosis not present

## 2016-07-30 DIAGNOSIS — M9902 Segmental and somatic dysfunction of thoracic region: Secondary | ICD-10-CM | POA: Diagnosis not present

## 2016-07-30 DIAGNOSIS — M5414 Radiculopathy, thoracic region: Secondary | ICD-10-CM | POA: Diagnosis not present

## 2016-07-30 DIAGNOSIS — M9903 Segmental and somatic dysfunction of lumbar region: Secondary | ICD-10-CM | POA: Diagnosis not present

## 2016-07-30 DIAGNOSIS — M9905 Segmental and somatic dysfunction of pelvic region: Secondary | ICD-10-CM | POA: Diagnosis not present

## 2016-08-13 DIAGNOSIS — M9903 Segmental and somatic dysfunction of lumbar region: Secondary | ICD-10-CM | POA: Diagnosis not present

## 2016-08-13 DIAGNOSIS — M5136 Other intervertebral disc degeneration, lumbar region: Secondary | ICD-10-CM | POA: Diagnosis not present

## 2016-08-13 DIAGNOSIS — M9905 Segmental and somatic dysfunction of pelvic region: Secondary | ICD-10-CM | POA: Diagnosis not present

## 2016-08-13 DIAGNOSIS — M5414 Radiculopathy, thoracic region: Secondary | ICD-10-CM | POA: Diagnosis not present

## 2016-08-13 DIAGNOSIS — M9902 Segmental and somatic dysfunction of thoracic region: Secondary | ICD-10-CM | POA: Diagnosis not present

## 2016-08-13 DIAGNOSIS — M5417 Radiculopathy, lumbosacral region: Secondary | ICD-10-CM | POA: Diagnosis not present

## 2016-09-04 DIAGNOSIS — M9903 Segmental and somatic dysfunction of lumbar region: Secondary | ICD-10-CM | POA: Diagnosis not present

## 2016-09-04 DIAGNOSIS — M5414 Radiculopathy, thoracic region: Secondary | ICD-10-CM | POA: Diagnosis not present

## 2016-09-04 DIAGNOSIS — M5136 Other intervertebral disc degeneration, lumbar region: Secondary | ICD-10-CM | POA: Diagnosis not present

## 2016-09-04 DIAGNOSIS — M5417 Radiculopathy, lumbosacral region: Secondary | ICD-10-CM | POA: Diagnosis not present

## 2016-09-04 DIAGNOSIS — M9902 Segmental and somatic dysfunction of thoracic region: Secondary | ICD-10-CM | POA: Diagnosis not present

## 2016-09-04 DIAGNOSIS — M9905 Segmental and somatic dysfunction of pelvic region: Secondary | ICD-10-CM | POA: Diagnosis not present

## 2016-09-16 DIAGNOSIS — L820 Inflamed seborrheic keratosis: Secondary | ICD-10-CM | POA: Diagnosis not present

## 2016-09-25 DIAGNOSIS — H0015 Chalazion left lower eyelid: Secondary | ICD-10-CM | POA: Diagnosis not present

## 2016-10-02 DIAGNOSIS — M9905 Segmental and somatic dysfunction of pelvic region: Secondary | ICD-10-CM | POA: Diagnosis not present

## 2016-10-02 DIAGNOSIS — M9903 Segmental and somatic dysfunction of lumbar region: Secondary | ICD-10-CM | POA: Diagnosis not present

## 2016-10-02 DIAGNOSIS — M5414 Radiculopathy, thoracic region: Secondary | ICD-10-CM | POA: Diagnosis not present

## 2016-10-02 DIAGNOSIS — M5136 Other intervertebral disc degeneration, lumbar region: Secondary | ICD-10-CM | POA: Diagnosis not present

## 2016-10-02 DIAGNOSIS — M9902 Segmental and somatic dysfunction of thoracic region: Secondary | ICD-10-CM | POA: Diagnosis not present

## 2016-10-02 DIAGNOSIS — M5417 Radiculopathy, lumbosacral region: Secondary | ICD-10-CM | POA: Diagnosis not present

## 2016-11-07 DIAGNOSIS — H524 Presbyopia: Secondary | ICD-10-CM | POA: Diagnosis not present

## 2016-11-07 DIAGNOSIS — H532 Diplopia: Secondary | ICD-10-CM | POA: Diagnosis not present

## 2016-11-12 DIAGNOSIS — Z23 Encounter for immunization: Secondary | ICD-10-CM | POA: Diagnosis not present

## 2016-11-13 DIAGNOSIS — M5417 Radiculopathy, lumbosacral region: Secondary | ICD-10-CM | POA: Diagnosis not present

## 2016-11-13 DIAGNOSIS — M9903 Segmental and somatic dysfunction of lumbar region: Secondary | ICD-10-CM | POA: Diagnosis not present

## 2016-11-13 DIAGNOSIS — M5414 Radiculopathy, thoracic region: Secondary | ICD-10-CM | POA: Diagnosis not present

## 2016-11-13 DIAGNOSIS — M9902 Segmental and somatic dysfunction of thoracic region: Secondary | ICD-10-CM | POA: Diagnosis not present

## 2016-11-13 DIAGNOSIS — M9905 Segmental and somatic dysfunction of pelvic region: Secondary | ICD-10-CM | POA: Diagnosis not present

## 2016-11-13 DIAGNOSIS — M5136 Other intervertebral disc degeneration, lumbar region: Secondary | ICD-10-CM | POA: Diagnosis not present

## 2016-11-18 DIAGNOSIS — L820 Inflamed seborrheic keratosis: Secondary | ICD-10-CM | POA: Diagnosis not present

## 2016-11-18 DIAGNOSIS — L570 Actinic keratosis: Secondary | ICD-10-CM | POA: Diagnosis not present

## 2016-11-18 DIAGNOSIS — X32XXXD Exposure to sunlight, subsequent encounter: Secondary | ICD-10-CM | POA: Diagnosis not present

## 2016-11-24 DIAGNOSIS — M9902 Segmental and somatic dysfunction of thoracic region: Secondary | ICD-10-CM | POA: Diagnosis not present

## 2016-11-24 DIAGNOSIS — M9905 Segmental and somatic dysfunction of pelvic region: Secondary | ICD-10-CM | POA: Diagnosis not present

## 2016-11-24 DIAGNOSIS — M5136 Other intervertebral disc degeneration, lumbar region: Secondary | ICD-10-CM | POA: Diagnosis not present

## 2016-11-24 DIAGNOSIS — M5417 Radiculopathy, lumbosacral region: Secondary | ICD-10-CM | POA: Diagnosis not present

## 2016-11-24 DIAGNOSIS — M9903 Segmental and somatic dysfunction of lumbar region: Secondary | ICD-10-CM | POA: Diagnosis not present

## 2016-11-24 DIAGNOSIS — M5414 Radiculopathy, thoracic region: Secondary | ICD-10-CM | POA: Diagnosis not present

## 2016-12-30 DIAGNOSIS — M9903 Segmental and somatic dysfunction of lumbar region: Secondary | ICD-10-CM | POA: Diagnosis not present

## 2016-12-30 DIAGNOSIS — M5136 Other intervertebral disc degeneration, lumbar region: Secondary | ICD-10-CM | POA: Diagnosis not present

## 2016-12-30 DIAGNOSIS — M5417 Radiculopathy, lumbosacral region: Secondary | ICD-10-CM | POA: Diagnosis not present

## 2016-12-30 DIAGNOSIS — M9902 Segmental and somatic dysfunction of thoracic region: Secondary | ICD-10-CM | POA: Diagnosis not present

## 2016-12-30 DIAGNOSIS — M9905 Segmental and somatic dysfunction of pelvic region: Secondary | ICD-10-CM | POA: Diagnosis not present

## 2016-12-30 DIAGNOSIS — M5414 Radiculopathy, thoracic region: Secondary | ICD-10-CM | POA: Diagnosis not present

## 2017-01-26 DIAGNOSIS — H532 Diplopia: Secondary | ICD-10-CM | POA: Diagnosis not present

## 2017-01-27 DIAGNOSIS — S62301A Unspecified fracture of second metacarpal bone, left hand, initial encounter for closed fracture: Secondary | ICD-10-CM | POA: Diagnosis not present

## 2017-02-03 ENCOUNTER — Emergency Department (HOSPITAL_COMMUNITY): Payer: Medicare Other

## 2017-02-03 ENCOUNTER — Encounter (HOSPITAL_COMMUNITY): Payer: Self-pay | Admitting: Emergency Medicine

## 2017-02-03 ENCOUNTER — Emergency Department (HOSPITAL_COMMUNITY)
Admission: EM | Admit: 2017-02-03 | Discharge: 2017-02-03 | Disposition: A | Discharge status: Home / Self Care | Payer: Medicare Other | Attending: Emergency Medicine | Admitting: Emergency Medicine

## 2017-02-03 DIAGNOSIS — R0789 Other chest pain: Secondary | ICD-10-CM | POA: Diagnosis not present

## 2017-02-03 DIAGNOSIS — R079 Chest pain, unspecified: Secondary | ICD-10-CM | POA: Diagnosis not present

## 2017-02-03 DIAGNOSIS — Z87891 Personal history of nicotine dependence: Secondary | ICD-10-CM | POA: Diagnosis not present

## 2017-02-03 DIAGNOSIS — Z79899 Other long term (current) drug therapy: Secondary | ICD-10-CM | POA: Diagnosis not present

## 2017-02-03 LAB — CBC WITH DIFFERENTIAL/PLATELET
Basophils Absolute: 0 10*3/uL (ref 0.0–0.1)
Basophils Relative: 0 %
Eosinophils Absolute: 0.2 10*3/uL (ref 0.0–0.7)
Eosinophils Relative: 5 %
HCT: 39.9 % (ref 39.0–52.0)
Hemoglobin: 13.5 g/dL (ref 13.0–17.0)
Lymphocytes Relative: 19 %
Lymphs Abs: 0.9 10*3/uL (ref 0.7–4.0)
MCH: 32 pg (ref 26.0–34.0)
MCHC: 33.8 g/dL (ref 30.0–36.0)
MCV: 94.5 fL (ref 78.0–100.0)
Monocytes Absolute: 0.6 10*3/uL (ref 0.1–1.0)
Monocytes Relative: 13 %
Neutro Abs: 3 10*3/uL (ref 1.7–7.7)
Neutrophils Relative %: 63 %
Platelets: 124 10*3/uL — ABNORMAL LOW (ref 150–400)
RBC: 4.22 MIL/uL (ref 4.22–5.81)
RDW: 13.4 % (ref 11.5–15.5)
WBC: 4.8 10*3/uL (ref 4.0–10.5)

## 2017-02-03 LAB — COMPREHENSIVE METABOLIC PANEL
ALT: 23 U/L (ref 17–63)
AST: 26 U/L (ref 15–41)
Albumin: 3.5 g/dL (ref 3.5–5.0)
Alkaline Phosphatase: 44 U/L (ref 38–126)
Anion gap: 10 (ref 5–15)
BUN: 19 mg/dL (ref 6–20)
CO2: 23 mmol/L (ref 22–32)
Calcium: 9.2 mg/dL (ref 8.9–10.3)
Chloride: 106 mmol/L (ref 101–111)
Creatinine, Ser: 0.86 mg/dL (ref 0.61–1.24)
GFR calc Af Amer: >60 mL/min (ref >60)
GFR calc non Af Amer: >60 mL/min (ref >60)
Glucose, Bld: 108 mg/dL — ABNORMAL HIGH (ref 65–99)
Potassium: 4.3 mmol/L (ref 3.5–5.1)
Sodium: 139 mmol/L (ref 135–145)
Total Bilirubin: 0.8 mg/dL (ref 0.3–1.2)
Total Protein: 5.7 g/dL — ABNORMAL LOW (ref 6.5–8.1)

## 2017-02-03 LAB — I-STAT TROPONIN, ED
Troponin i, poc: 0.01 ng/mL (ref 0.00–0.08)
Troponin i, poc: 0.01 ng/mL (ref 0.00–0.08)

## 2017-02-03 MED ORDER — ASPIRIN EC 81 MG PO TBEC: 81.0000 mg | DELAYED_RELEASE_TABLET | Freq: Every day | ORAL | 2 refills | Status: DC

## 2017-02-03 NOTE — ED Provider Notes (Signed)
Patient seen/examined in the Emergency Department in conjunction with Midlevel Provider  Patient reports chest pain prior to arrival, improved with aspirin and nitroglycerin Exam : Awake alert no distress, systolic ejection murmur noted Plan: Workup pending at this time   Ripley Fraise, MD 02/03/17 [phone]

## 2017-02-03 NOTE — ED Triage Notes (Signed)
BIB EMS from home for CP. Central, non radiating, heavy 7/10. Given 324 ASA and 1NTG, currently CP free. Denies SOB, N/V, etc. Pt was initially hypertensive with EMS (230/130) no prior hx, is now normotensive.

## 2017-02-03 NOTE — ED Provider Notes (Signed)
[place] EMERGENCY DEPARTMENT Provider Note   CSN: [id number] Arrival date & time: 02/03/17  0405     History   Chief Complaint Chief Complaint  Patient presents with  . Chest Pain    HPI [name] [name] is a 83 y.o. male presents today for evaluation of chest pressure.  He reports that around 2 AM he woke up, he is not sure if he woke up due to needing the use of bathroom or due to the chest pressure.  He reports that he had 7 out of 10 central substernal chest pressure, nonradiating.  He denies any sweating or nausea during this.  He reports that he took a few Tums to see if that would help, however it did not and he called 911.  He presents today with his wife who assists in history.  He reports that he regularly sees a doctor once a year for a physical, however does not require any medications.  In route patient was initially hypertensive 230/130.  He does not have a history of hypertension.  He was treated with 324 mg ASA and 1 Nitro which fully relieved his chest pressure.  He reports he has a history of a heart murmur.  He denies recent illness, no recent cough.  He reports that he used to smoke, however has not smoked for 40 years.  He exercises with his wife three times a week.  He reports that last night he ate [place] food.    HPI  History reviewed. No pertinent past medical history.  There are no active problems to display for this patient.   History reviewed. No pertinent surgical history.     Home Medications    Prior to Admission medications   Medication Sig Start Date End Date Taking? Authorizing Provider  Cholecalciferol (VITAMIN D PO) Take 1 tablet by mouth daily.   Yes [provider]  Multiple Vitamin (MULTIVITAMIN WITH MINERALS) TABS tablet Take 1 tablet by mouth daily.   Yes [provider]    Family History No family history on file.  Social History Social History   Tobacco Use  . Smoking status: Former Research scientist (life sciences)    . Smokeless tobacco: Never Used  Substance Use Topics  . Alcohol use: Yes    Comment: socially   . Drug use: No     Allergies   Patient has no known allergies.   Review of Systems Review of Systems  Constitutional: Negative for chills, diaphoresis and fever.  HENT: Negative for ear pain and sore throat.   Eyes: Negative for pain and visual disturbance.  Respiratory: Negative for cough and shortness of breath.   Cardiovascular: Negative for chest pain and palpitations.       Chest pressure, initially was 7 out of 10, currently has no chest discomfort.   Gastrointestinal: Negative for abdominal pain and vomiting.  Genitourinary: Negative for dysuria and hematuria.  Musculoskeletal: Negative for arthralgias and back pain.  Skin: Negative for color change and rash.  Neurological: Negative for seizures, syncope and headaches.  All other systems reviewed and are negative.    Physical Exam Updated Vital Signs BP (!) 150/89   Pulse 65   Temp 98.4 F (36.9 C) (Oral)   Resp 19   Ht 5\' 10"  (1.778 m)   Wt 97.5 kg (215 lb)   SpO2 97%   BMI 30.85 kg/m   Physical Exam  Constitutional: He is oriented to person, place, and time. He appears well-developed and well-nourished.  No distress.  HENT:  Head: Normocephalic and atraumatic.  Mouth/Throat: Oropharynx is clear and moist.  Eyes: Conjunctivae are normal.  Neck: Neck supple.  Cardiovascular: Normal rate and regular rhythm.  Murmur (Heard loudest over second midclavicular space to the right of the sternum) heard. Pulses:      Radial pulses are 2+ on the right side, and 2+ on the left side.       Dorsalis pedis pulses are 2+ on the right side, and 2+ on the left side.       Posterior tibial pulses are 2+ on the right side, and 2+ on the left side.  Pulmonary/Chest: Effort normal and breath sounds normal. No respiratory distress. He has no decreased breath sounds. He has no wheezes. He has no rales.  Abdominal: Soft. Bowel  sounds are normal. He exhibits no distension. There is no tenderness.  Musculoskeletal: He exhibits no edema.       Right lower leg: Normal. He exhibits no edema.       Left lower leg: Normal. He exhibits no edema.  Neurological: He is alert and oriented to person, place, and time.  Skin: Skin is warm and dry. He is not diaphoretic.  Psychiatric: He has a normal mood and affect. His behavior is normal.  Nursing note and vitals reviewed.     ED Treatments / Results  Labs (all labs ordered are listed, but only abnormal results are displayed) Labs Reviewed  COMPREHENSIVE METABOLIC PANEL - Abnormal; Notable for the following components:      Result Value   Glucose, Bld 108 (*)    Total Protein 5.7 (*)    All other components within normal limits  CBC WITH DIFFERENTIAL/PLATELET - Abnormal; Notable for the following components:   Platelets 124 (*)    All other components within normal limits  I-STAT TROPONIN, ED  I-STAT TROPONIN, ED    EKG  EKG Interpretation  Date/Time:  Tuesday February 03 2017 04:42:23 EST Ventricular Rate:  66 PR Interval:    QRS Duration: 98 QT Interval:  434 QTC Calculation: 455 R Axis:   1 Text Interpretation:  Sinus rhythm No significant change since last tracing Confirmed by Ripley Fraise [phone]) on 02/03/2017 4:46:31 AM       Radiology Dg Chest 2 View  Result Date: 02/03/2017 CLINICAL DATA:  83 year old male with chest tightness. EXAM: CHEST  2 VIEW COMPARISON:  Chest radiograph dated 06/09/2007 FINDINGS: The lungs are clear. There is no pleural effusion or pneumothorax. Stable cardiac silhouette. There is atherosclerotic calcification of the aortic arch. No acute osseous pathology. IMPRESSION: No active cardiopulmonary disease. Electronically Signed   By: Anner Crete M.D.   On: 02/03/2017 05:52    Procedures Procedures (including critical care time)  Medications Ordered in ED Medications - No data to display   Initial Impression /  Assessment and Plan / ED Course  I have reviewed the triage vital signs and the nursing notes.  Pertinent labs & imaging results that were available during my care of the patient were reviewed by me and considered in my medical decision making (see chart for details).  Clinical Course as of Feb 03 610  Tue Feb 03, 2017  0512 Trop running now  [EH]  [phone] Patient and wife updated on labs, plan for second trop.    [EH]  0609 Took patient care handoff report Plan: Second troponin, d/c home with PCP f/u.  [SJ]    Clinical Course User Index [EH] Lorin Glass,  PA-C [SJ] Lorayne Bender, PA-C   Win Guajardo today for evaluation of chest pressure.  His chest pressure was treated in route by EMS with 324 mg aspirin and 1 nitroglycerin which resolved his chest pressure.  He was initially noted to be hyertensive with EMS, does not have a history, resolved after NTG.  Labs were obtained, initial troponin  0.01.  EKG normal sinus rhythm.  Heart score of 3- 2 points for age, one for moderately suspicious history.  Plan for second troponin at 3 hours.  If troponin normal and patient remains asymptomatic than plan for d/c home.  His PCP is Dr. Osborne Casco.  He was advised that if discharged he will need to follow up with his PCP in the next few days.   At shift change care was transferred to [facility] who will follow pending studies, re-evaulate and determine disposition.     Final Clinical Impressions(s) / ED Diagnoses   Final diagnoses:  Chest pressure    ED Discharge Orders    None       Ollen Gross 02/03/17 7035    Ripley Fraise, MD 02/03/17 [phone]

## 2017-02-03 NOTE — ED Provider Notes (Signed)
[name] [name] is a 83 y.o. male, patient with no pertinent past medical history, presenting to the ED with chest pressure.   HPI from Wyn Quaker, PA-C: "[name] [name] is a 84 y.o. male presents today for evaluation of chest pressure.  He reports that around 2 AM he woke up, he is not sure if he woke up due to needing the use of bathroom or due to the chest pressure.  He reports that he had 7 out of 10 central substernal chest pressure, nonradiating.  He denies any sweating or nausea during this.  He reports that he took a few Tums to see if that would help, however it did not and he called 911.  He presents today with his wife who assists in history.  He reports that he regularly sees a doctor once a year for a physical, however does not require any medications.  In route patient was initially hypertensive 230/130.  He does not have a history of hypertension.  He was treated with 324 mg ASA and 1 Nitro which fully relieved his chest pressure.  He reports he has a history of a heart murmur.  He denies recent illness, no recent cough.  He reports that he used to smoke, however has not smoked for 40 years.  He exercises with his wife three times a week.  He reports that last night he ate [place] food."  History reviewed. No pertinent past medical history.     Physical Exam  BP (!) 150/89   Pulse 65   Temp 98.4 F (36.9 C) (Oral)   Resp 19   Ht 5\' 10"  (1.778 m)   Wt 97.5 kg (215 lb)   SpO2 97%   BMI 30.85 kg/m   Physical Exam  Constitutional: He appears well-developed and well-nourished. No distress.  HENT:  Head: Normocephalic and atraumatic.  Eyes: Conjunctivae are normal.  Neck: Neck supple.  Cardiovascular: Normal rate, regular rhythm, normal heart sounds and intact distal pulses.  Pulmonary/Chest: Effort normal and breath sounds normal. No respiratory distress.  Abdominal: Soft. There is no tenderness. There is no guarding.  Musculoskeletal: He exhibits no edema.   Lymphadenopathy:    He has no cervical adenopathy.  Neurological: He is alert.  Skin: Skin is warm and dry. He is not diaphoretic.  Psychiatric: He has a normal mood and affect. His behavior is normal.  Nursing note and vitals reviewed.    ED Course/Procedures   Results for orders placed or performed during the hospital encounter of 02/03/17  Comprehensive metabolic panel  Result Value Ref Range   Sodium 139 135 - 145 mmol/L   Potassium 4.3 3.5 - 5.1 mmol/L   Chloride 106 101 - 111 mmol/L   CO2 23 22 - 32 mmol/L   Glucose, Bld 108 (H) 65 - 99 mg/dL   BUN 19 6 - 20 mg/dL   Creatinine, Ser 0.86 0.61 - 1.24 mg/dL   Calcium 9.2 8.9 - 10.3 mg/dL   Total Protein 5.7 (L) 6.5 - 8.1 g/dL   Albumin 3.5 3.5 - 5.0 g/dL   AST 26 15 - 41 U/L   ALT 23 17 - 63 U/L   Alkaline Phosphatase 44 38 - 126 U/L   Total Bilirubin 0.8 0.3 - 1.2 mg/dL   GFR calc non Af Amer >60 >60 mL/min   GFR calc Af Amer >60 >60 mL/min   Anion gap 10 5 - 15  CBC with Differential  Result Value Ref Range  WBC 4.8 4.0 - 10.5 K/uL   RBC 4.22 4.22 - 5.81 MIL/uL   Hemoglobin 13.5 13.0 - 17.0 g/dL   HCT 39.9 39.0 - 52.0 %   MCV 94.5 78.0 - 100.0 fL   MCH 32.0 26.0 - 34.0 pg   MCHC 33.8 30.0 - 36.0 g/dL   RDW 13.4 11.5 - 15.5 %   Platelets 124 (L) 150 - 400 K/uL   Neutrophils Relative % 63 %   Neutro Abs 3.0 1.7 - 7.7 K/uL   Lymphocytes Relative 19 %   Lymphs Abs 0.9 0.7 - 4.0 K/uL   Monocytes Relative 13 %   Monocytes Absolute 0.6 0.1 - 1.0 K/uL   Eosinophils Relative 5 %   Eosinophils Absolute 0.2 0.0 - 0.7 K/uL   Basophils Relative 0 %   Basophils Absolute 0.0 0.0 - 0.1 K/uL  I-stat troponin, ED  Result Value Ref Range   Troponin i, poc 0.01 0.00 - 0.08 ng/mL   Comment 3          I-stat troponin, ED (0, 3)  Result Value Ref Range   Troponin i, poc 0.01 0.00 - 0.08 ng/mL   Comment 3           Dg Chest 2 View  Result Date: 02/03/2017 CLINICAL DATA:  83 year old male with chest tightness. EXAM:  CHEST  2 VIEW COMPARISON:  Chest radiograph dated 06/09/2007 FINDINGS: The lungs are clear. There is no pleural effusion or pneumothorax. Stable cardiac silhouette. There is atherosclerotic calcification of the aortic arch. No acute osseous pathology. IMPRESSION: No active cardiopulmonary disease. Electronically Signed   By: Anner Crete M.D.   On: 02/03/2017 05:52     Procedures    MDM    Clinical Course as of Feb 04 855  Tue Feb 03, 2017  0512 Trop running now  [EH]  [phone] Patient and wife updated on labs, plan for second trop.    [EH]  0609 Took patient care handoff report from Wyn Quaker, PA-C. Plan: Second troponin, d/c home with PCP and cardiology followup. Start patient on 81 mg aspirin daily.  [SJ]  [phone] Spoke with the patient.  Reviewed labs, imaging, and discharge plan.  Patient voices understanding and agrees to the plan.  Patient continues to deny recurrence of symptoms.  [SJ]    Clinical Course User Index [EH] Lorin Glass, PA-C [SJ] Lorayne Bender, PA-C   Patient presented with complaint of chest discomfort.  Symptoms resolved and did not recur during ED course.  Delta troponins negative.  Other labs are reassuring.  PCP and cardiology follow-up.  Resources given.  Return precautions discussed.  Patient voices understanding and is comfortable discharge.     Vitals:   02/03/17 0630 02/03/17 0700 02/03/17 0730 02/03/17 0830  BP: (!) 163/88 (!) 159/80 (!) 161/96 (!) 173/92  Pulse: 69 66 69 69  Resp: (!) 22 20 20 16   Temp:      TempSrc:      SpO2: 93% 94% 94% 94%  Weight:      Height:          Lorayne Bender, PA-C 02/03/17 0856    Ripley Fraise, MD 02/03/17 2305

## 2017-02-03 NOTE — Discharge Instructions (Addendum)
You have been seen today for chest pain.  Your lab and imaging results are reassuring.  However, it is recommended that you follow-up with your primary care provider and a cardiologist on this matter.  We recommend you begin taking an 81mg  aspirin daily. You may take this with food to avoid upset stomach.  Return to the ED immediately should you experience chest pain, shortness of breath, dizziness, or any other major concerns.

## 2017-02-03 NOTE — ED Notes (Signed)
EDP at bedside  

## 2017-02-13 DIAGNOSIS — J3489 Other specified disorders of nose and nasal sinuses: Secondary | ICD-10-CM | POA: Diagnosis not present

## 2017-02-13 DIAGNOSIS — M9905 Segmental and somatic dysfunction of pelvic region: Secondary | ICD-10-CM | POA: Diagnosis not present

## 2017-02-13 DIAGNOSIS — M9902 Segmental and somatic dysfunction of thoracic region: Secondary | ICD-10-CM | POA: Diagnosis not present

## 2017-02-13 DIAGNOSIS — Z6831 Body mass index (BMI) 31.0-31.9, adult: Secondary | ICD-10-CM | POA: Diagnosis not present

## 2017-02-13 DIAGNOSIS — M5136 Other intervertebral disc degeneration, lumbar region: Secondary | ICD-10-CM | POA: Diagnosis not present

## 2017-02-13 DIAGNOSIS — M5417 Radiculopathy, lumbosacral region: Secondary | ICD-10-CM | POA: Diagnosis not present

## 2017-02-13 DIAGNOSIS — M9903 Segmental and somatic dysfunction of lumbar region: Secondary | ICD-10-CM | POA: Diagnosis not present

## 2017-02-13 DIAGNOSIS — M5414 Radiculopathy, thoracic region: Secondary | ICD-10-CM | POA: Diagnosis not present

## 2017-02-13 DIAGNOSIS — R0789 Other chest pain: Secondary | ICD-10-CM | POA: Diagnosis not present

## 2017-02-13 DIAGNOSIS — R030 Elevated blood-pressure reading, without diagnosis of hypertension: Secondary | ICD-10-CM | POA: Diagnosis not present

## 2017-02-17 DIAGNOSIS — S62301D Unspecified fracture of second metacarpal bone, left hand, subsequent encounter for fracture with routine healing: Secondary | ICD-10-CM | POA: Diagnosis not present

## 2017-03-02 DIAGNOSIS — E7800 Pure hypercholesterolemia, unspecified: Secondary | ICD-10-CM | POA: Diagnosis not present

## 2017-03-02 DIAGNOSIS — R82998 Other abnormal findings in urine: Secondary | ICD-10-CM | POA: Diagnosis not present

## 2017-03-02 DIAGNOSIS — Z125 Encounter for screening for malignant neoplasm of prostate: Secondary | ICD-10-CM | POA: Diagnosis not present

## 2017-03-09 DIAGNOSIS — F5221 Male erectile disorder: Secondary | ICD-10-CM | POA: Diagnosis not present

## 2017-03-09 DIAGNOSIS — Z0000 Encounter for general adult medical examination without abnormal findings: Secondary | ICD-10-CM | POA: Diagnosis not present

## 2017-03-09 DIAGNOSIS — Z1389 Encounter for screening for other disorder: Secondary | ICD-10-CM | POA: Diagnosis not present

## 2017-03-09 DIAGNOSIS — D126 Benign neoplasm of colon, unspecified: Secondary | ICD-10-CM | POA: Diagnosis not present

## 2017-03-09 DIAGNOSIS — Z6831 Body mass index (BMI) 31.0-31.9, adult: Secondary | ICD-10-CM | POA: Diagnosis not present

## 2017-03-09 DIAGNOSIS — E668 Other obesity: Secondary | ICD-10-CM | POA: Diagnosis not present

## 2017-03-09 DIAGNOSIS — I10 Essential (primary) hypertension: Secondary | ICD-10-CM | POA: Diagnosis not present

## 2017-03-09 DIAGNOSIS — E7800 Pure hypercholesterolemia, unspecified: Secondary | ICD-10-CM | POA: Diagnosis not present

## 2017-03-11 DIAGNOSIS — R0789 Other chest pain: Secondary | ICD-10-CM | POA: Diagnosis not present

## 2017-03-11 DIAGNOSIS — R0609 Other forms of dyspnea: Secondary | ICD-10-CM | POA: Diagnosis not present

## 2017-03-11 DIAGNOSIS — I10 Essential (primary) hypertension: Secondary | ICD-10-CM | POA: Diagnosis not present

## 2017-03-11 DIAGNOSIS — I350 Nonrheumatic aortic (valve) stenosis: Secondary | ICD-10-CM | POA: Diagnosis not present

## 2017-03-12 DIAGNOSIS — I350 Nonrheumatic aortic (valve) stenosis: Secondary | ICD-10-CM | POA: Diagnosis not present

## 2017-03-12 DIAGNOSIS — R0789 Other chest pain: Secondary | ICD-10-CM | POA: Diagnosis not present

## 2017-03-12 DIAGNOSIS — Z1212 Encounter for screening for malignant neoplasm of rectum: Secondary | ICD-10-CM | POA: Diagnosis not present

## 2017-03-13 DIAGNOSIS — M5417 Radiculopathy, lumbosacral region: Secondary | ICD-10-CM | POA: Diagnosis not present

## 2017-03-13 DIAGNOSIS — M9903 Segmental and somatic dysfunction of lumbar region: Secondary | ICD-10-CM | POA: Diagnosis not present

## 2017-03-13 DIAGNOSIS — M9905 Segmental and somatic dysfunction of pelvic region: Secondary | ICD-10-CM | POA: Diagnosis not present

## 2017-03-13 DIAGNOSIS — M5414 Radiculopathy, thoracic region: Secondary | ICD-10-CM | POA: Diagnosis not present

## 2017-03-13 DIAGNOSIS — M9902 Segmental and somatic dysfunction of thoracic region: Secondary | ICD-10-CM | POA: Diagnosis not present

## 2017-03-13 DIAGNOSIS — M5136 Other intervertebral disc degeneration, lumbar region: Secondary | ICD-10-CM | POA: Diagnosis not present

## 2017-03-16 DIAGNOSIS — I350 Nonrheumatic aortic (valve) stenosis: Secondary | ICD-10-CM | POA: Diagnosis not present

## 2017-03-16 DIAGNOSIS — K219 Gastro-esophageal reflux disease without esophagitis: Secondary | ICD-10-CM | POA: Diagnosis not present

## 2017-03-16 DIAGNOSIS — R0609 Other forms of dyspnea: Secondary | ICD-10-CM | POA: Diagnosis not present

## 2017-03-16 DIAGNOSIS — I10 Essential (primary) hypertension: Secondary | ICD-10-CM | POA: Diagnosis not present

## 2017-03-24 DIAGNOSIS — D045 Carcinoma in situ of skin of trunk: Secondary | ICD-10-CM | POA: Diagnosis not present

## 2017-03-24 DIAGNOSIS — L308 Other specified dermatitis: Secondary | ICD-10-CM | POA: Diagnosis not present

## 2017-03-24 DIAGNOSIS — X32XXXD Exposure to sunlight, subsequent encounter: Secondary | ICD-10-CM | POA: Diagnosis not present

## 2017-03-24 DIAGNOSIS — L218 Other seborrheic dermatitis: Secondary | ICD-10-CM | POA: Diagnosis not present

## 2017-03-24 DIAGNOSIS — L570 Actinic keratosis: Secondary | ICD-10-CM | POA: Diagnosis not present

## 2017-03-27 DIAGNOSIS — I350 Nonrheumatic aortic (valve) stenosis: Secondary | ICD-10-CM | POA: Diagnosis not present

## 2017-04-02 DIAGNOSIS — I10 Essential (primary) hypertension: Secondary | ICD-10-CM | POA: Diagnosis not present

## 2017-04-02 DIAGNOSIS — Z0189 Encounter for other specified special examinations: Secondary | ICD-10-CM | POA: Diagnosis not present

## 2017-04-02 DIAGNOSIS — K219 Gastro-esophageal reflux disease without esophagitis: Secondary | ICD-10-CM | POA: Diagnosis not present

## 2017-04-02 DIAGNOSIS — I350 Nonrheumatic aortic (valve) stenosis: Secondary | ICD-10-CM | POA: Diagnosis not present

## 2017-04-09 DIAGNOSIS — M5414 Radiculopathy, thoracic region: Secondary | ICD-10-CM | POA: Diagnosis not present

## 2017-04-09 DIAGNOSIS — M5136 Other intervertebral disc degeneration, lumbar region: Secondary | ICD-10-CM | POA: Diagnosis not present

## 2017-04-09 DIAGNOSIS — M9905 Segmental and somatic dysfunction of pelvic region: Secondary | ICD-10-CM | POA: Diagnosis not present

## 2017-04-09 DIAGNOSIS — M9902 Segmental and somatic dysfunction of thoracic region: Secondary | ICD-10-CM | POA: Diagnosis not present

## 2017-04-09 DIAGNOSIS — M5417 Radiculopathy, lumbosacral region: Secondary | ICD-10-CM | POA: Diagnosis not present

## 2017-04-09 DIAGNOSIS — M9903 Segmental and somatic dysfunction of lumbar region: Secondary | ICD-10-CM | POA: Diagnosis not present

## 2017-04-15 DIAGNOSIS — R011 Cardiac murmur, unspecified: Secondary | ICD-10-CM | POA: Diagnosis not present

## 2017-04-15 DIAGNOSIS — I350 Nonrheumatic aortic (valve) stenosis: Secondary | ICD-10-CM | POA: Diagnosis not present

## 2017-05-07 DIAGNOSIS — M9905 Segmental and somatic dysfunction of pelvic region: Secondary | ICD-10-CM | POA: Diagnosis not present

## 2017-05-07 DIAGNOSIS — M9902 Segmental and somatic dysfunction of thoracic region: Secondary | ICD-10-CM | POA: Diagnosis not present

## 2017-05-07 DIAGNOSIS — M5414 Radiculopathy, thoracic region: Secondary | ICD-10-CM | POA: Diagnosis not present

## 2017-05-07 DIAGNOSIS — M5417 Radiculopathy, lumbosacral region: Secondary | ICD-10-CM | POA: Diagnosis not present

## 2017-05-07 DIAGNOSIS — M5136 Other intervertebral disc degeneration, lumbar region: Secondary | ICD-10-CM | POA: Diagnosis not present

## 2017-05-07 DIAGNOSIS — M9903 Segmental and somatic dysfunction of lumbar region: Secondary | ICD-10-CM | POA: Diagnosis not present

## 2017-06-03 DIAGNOSIS — L820 Inflamed seborrheic keratosis: Secondary | ICD-10-CM | POA: Diagnosis not present

## 2017-06-03 DIAGNOSIS — Z08 Encounter for follow-up examination after completed treatment for malignant neoplasm: Secondary | ICD-10-CM | POA: Diagnosis not present

## 2017-06-03 DIAGNOSIS — Z85820 Personal history of malignant melanoma of skin: Secondary | ICD-10-CM | POA: Diagnosis not present

## 2017-06-03 DIAGNOSIS — Z1283 Encounter for screening for malignant neoplasm of skin: Secondary | ICD-10-CM | POA: Diagnosis not present

## 2017-06-05 DIAGNOSIS — M9905 Segmental and somatic dysfunction of pelvic region: Secondary | ICD-10-CM | POA: Diagnosis not present

## 2017-06-05 DIAGNOSIS — M5414 Radiculopathy, thoracic region: Secondary | ICD-10-CM | POA: Diagnosis not present

## 2017-06-05 DIAGNOSIS — M5417 Radiculopathy, lumbosacral region: Secondary | ICD-10-CM | POA: Diagnosis not present

## 2017-06-05 DIAGNOSIS — M5136 Other intervertebral disc degeneration, lumbar region: Secondary | ICD-10-CM | POA: Diagnosis not present

## 2017-06-05 DIAGNOSIS — M9903 Segmental and somatic dysfunction of lumbar region: Secondary | ICD-10-CM | POA: Diagnosis not present

## 2017-06-05 DIAGNOSIS — M9902 Segmental and somatic dysfunction of thoracic region: Secondary | ICD-10-CM | POA: Diagnosis not present

## 2017-07-09 DIAGNOSIS — M9905 Segmental and somatic dysfunction of pelvic region: Secondary | ICD-10-CM | POA: Diagnosis not present

## 2017-07-09 DIAGNOSIS — M5136 Other intervertebral disc degeneration, lumbar region: Secondary | ICD-10-CM | POA: Diagnosis not present

## 2017-07-09 DIAGNOSIS — M9902 Segmental and somatic dysfunction of thoracic region: Secondary | ICD-10-CM | POA: Diagnosis not present

## 2017-07-09 DIAGNOSIS — M5414 Radiculopathy, thoracic region: Secondary | ICD-10-CM | POA: Diagnosis not present

## 2017-07-09 DIAGNOSIS — M5417 Radiculopathy, lumbosacral region: Secondary | ICD-10-CM | POA: Diagnosis not present

## 2017-07-09 DIAGNOSIS — M9903 Segmental and somatic dysfunction of lumbar region: Secondary | ICD-10-CM | POA: Diagnosis not present

## 2017-08-06 DIAGNOSIS — M5417 Radiculopathy, lumbosacral region: Secondary | ICD-10-CM | POA: Diagnosis not present

## 2017-08-06 DIAGNOSIS — M9903 Segmental and somatic dysfunction of lumbar region: Secondary | ICD-10-CM | POA: Diagnosis not present

## 2017-08-06 DIAGNOSIS — M5136 Other intervertebral disc degeneration, lumbar region: Secondary | ICD-10-CM | POA: Diagnosis not present

## 2017-08-06 DIAGNOSIS — M5414 Radiculopathy, thoracic region: Secondary | ICD-10-CM | POA: Diagnosis not present

## 2017-08-06 DIAGNOSIS — M9902 Segmental and somatic dysfunction of thoracic region: Secondary | ICD-10-CM | POA: Diagnosis not present

## 2017-08-06 DIAGNOSIS — M9905 Segmental and somatic dysfunction of pelvic region: Secondary | ICD-10-CM | POA: Diagnosis not present

## 2017-08-24 DIAGNOSIS — M9905 Segmental and somatic dysfunction of pelvic region: Secondary | ICD-10-CM | POA: Diagnosis not present

## 2017-08-24 DIAGNOSIS — M5417 Radiculopathy, lumbosacral region: Secondary | ICD-10-CM | POA: Diagnosis not present

## 2017-08-24 DIAGNOSIS — M5136 Other intervertebral disc degeneration, lumbar region: Secondary | ICD-10-CM | POA: Diagnosis not present

## 2017-08-24 DIAGNOSIS — M5414 Radiculopathy, thoracic region: Secondary | ICD-10-CM | POA: Diagnosis not present

## 2017-08-24 DIAGNOSIS — M9902 Segmental and somatic dysfunction of thoracic region: Secondary | ICD-10-CM | POA: Diagnosis not present

## 2017-08-24 DIAGNOSIS — M9903 Segmental and somatic dysfunction of lumbar region: Secondary | ICD-10-CM | POA: Diagnosis not present

## 2017-08-27 DIAGNOSIS — M9903 Segmental and somatic dysfunction of lumbar region: Secondary | ICD-10-CM | POA: Diagnosis not present

## 2017-08-27 DIAGNOSIS — M9905 Segmental and somatic dysfunction of pelvic region: Secondary | ICD-10-CM | POA: Diagnosis not present

## 2017-08-27 DIAGNOSIS — M5136 Other intervertebral disc degeneration, lumbar region: Secondary | ICD-10-CM | POA: Diagnosis not present

## 2017-08-27 DIAGNOSIS — M5414 Radiculopathy, thoracic region: Secondary | ICD-10-CM | POA: Diagnosis not present

## 2017-08-27 DIAGNOSIS — M9902 Segmental and somatic dysfunction of thoracic region: Secondary | ICD-10-CM | POA: Diagnosis not present

## 2017-08-27 DIAGNOSIS — M5417 Radiculopathy, lumbosacral region: Secondary | ICD-10-CM | POA: Diagnosis not present

## 2017-08-31 DIAGNOSIS — M5414 Radiculopathy, thoracic region: Secondary | ICD-10-CM | POA: Diagnosis not present

## 2017-08-31 DIAGNOSIS — M9905 Segmental and somatic dysfunction of pelvic region: Secondary | ICD-10-CM | POA: Diagnosis not present

## 2017-08-31 DIAGNOSIS — M5136 Other intervertebral disc degeneration, lumbar region: Secondary | ICD-10-CM | POA: Diagnosis not present

## 2017-08-31 DIAGNOSIS — M9903 Segmental and somatic dysfunction of lumbar region: Secondary | ICD-10-CM | POA: Diagnosis not present

## 2017-08-31 DIAGNOSIS — M5417 Radiculopathy, lumbosacral region: Secondary | ICD-10-CM | POA: Diagnosis not present

## 2017-08-31 DIAGNOSIS — M9902 Segmental and somatic dysfunction of thoracic region: Secondary | ICD-10-CM | POA: Diagnosis not present

## 2017-09-02 DIAGNOSIS — M5417 Radiculopathy, lumbosacral region: Secondary | ICD-10-CM | POA: Diagnosis not present

## 2017-09-02 DIAGNOSIS — M5414 Radiculopathy, thoracic region: Secondary | ICD-10-CM | POA: Diagnosis not present

## 2017-09-02 DIAGNOSIS — M9903 Segmental and somatic dysfunction of lumbar region: Secondary | ICD-10-CM | POA: Diagnosis not present

## 2017-09-02 DIAGNOSIS — M9905 Segmental and somatic dysfunction of pelvic region: Secondary | ICD-10-CM | POA: Diagnosis not present

## 2017-09-02 DIAGNOSIS — M5136 Other intervertebral disc degeneration, lumbar region: Secondary | ICD-10-CM | POA: Diagnosis not present

## 2017-09-02 DIAGNOSIS — M9902 Segmental and somatic dysfunction of thoracic region: Secondary | ICD-10-CM | POA: Diagnosis not present

## 2017-09-07 DIAGNOSIS — M5414 Radiculopathy, thoracic region: Secondary | ICD-10-CM | POA: Diagnosis not present

## 2017-09-07 DIAGNOSIS — M5417 Radiculopathy, lumbosacral region: Secondary | ICD-10-CM | POA: Diagnosis not present

## 2017-09-07 DIAGNOSIS — M5136 Other intervertebral disc degeneration, lumbar region: Secondary | ICD-10-CM | POA: Diagnosis not present

## 2017-09-07 DIAGNOSIS — M9905 Segmental and somatic dysfunction of pelvic region: Secondary | ICD-10-CM | POA: Diagnosis not present

## 2017-09-07 DIAGNOSIS — M9902 Segmental and somatic dysfunction of thoracic region: Secondary | ICD-10-CM | POA: Diagnosis not present

## 2017-09-07 DIAGNOSIS — M9903 Segmental and somatic dysfunction of lumbar region: Secondary | ICD-10-CM | POA: Diagnosis not present

## 2017-09-10 DIAGNOSIS — M9905 Segmental and somatic dysfunction of pelvic region: Secondary | ICD-10-CM | POA: Diagnosis not present

## 2017-09-10 DIAGNOSIS — M5414 Radiculopathy, thoracic region: Secondary | ICD-10-CM | POA: Diagnosis not present

## 2017-09-10 DIAGNOSIS — M9902 Segmental and somatic dysfunction of thoracic region: Secondary | ICD-10-CM | POA: Diagnosis not present

## 2017-09-10 DIAGNOSIS — M5136 Other intervertebral disc degeneration, lumbar region: Secondary | ICD-10-CM | POA: Diagnosis not present

## 2017-09-10 DIAGNOSIS — M5417 Radiculopathy, lumbosacral region: Secondary | ICD-10-CM | POA: Diagnosis not present

## 2017-09-10 DIAGNOSIS — M9903 Segmental and somatic dysfunction of lumbar region: Secondary | ICD-10-CM | POA: Diagnosis not present

## 2017-09-16 DIAGNOSIS — I350 Nonrheumatic aortic (valve) stenosis: Secondary | ICD-10-CM | POA: Diagnosis not present

## 2017-09-17 DIAGNOSIS — M9905 Segmental and somatic dysfunction of pelvic region: Secondary | ICD-10-CM | POA: Diagnosis not present

## 2017-09-17 DIAGNOSIS — M5417 Radiculopathy, lumbosacral region: Secondary | ICD-10-CM | POA: Diagnosis not present

## 2017-09-17 DIAGNOSIS — M9903 Segmental and somatic dysfunction of lumbar region: Secondary | ICD-10-CM | POA: Diagnosis not present

## 2017-09-17 DIAGNOSIS — M9902 Segmental and somatic dysfunction of thoracic region: Secondary | ICD-10-CM | POA: Diagnosis not present

## 2017-09-17 DIAGNOSIS — M5414 Radiculopathy, thoracic region: Secondary | ICD-10-CM | POA: Diagnosis not present

## 2017-09-17 DIAGNOSIS — M5136 Other intervertebral disc degeneration, lumbar region: Secondary | ICD-10-CM | POA: Diagnosis not present

## 2017-09-24 DIAGNOSIS — M5136 Other intervertebral disc degeneration, lumbar region: Secondary | ICD-10-CM | POA: Diagnosis not present

## 2017-09-24 DIAGNOSIS — M5417 Radiculopathy, lumbosacral region: Secondary | ICD-10-CM | POA: Diagnosis not present

## 2017-09-24 DIAGNOSIS — M9902 Segmental and somatic dysfunction of thoracic region: Secondary | ICD-10-CM | POA: Diagnosis not present

## 2017-09-24 DIAGNOSIS — M5414 Radiculopathy, thoracic region: Secondary | ICD-10-CM | POA: Diagnosis not present

## 2017-09-24 DIAGNOSIS — M9903 Segmental and somatic dysfunction of lumbar region: Secondary | ICD-10-CM | POA: Diagnosis not present

## 2017-09-24 DIAGNOSIS — M9905 Segmental and somatic dysfunction of pelvic region: Secondary | ICD-10-CM | POA: Diagnosis not present

## 2017-09-28 DIAGNOSIS — I10 Essential (primary) hypertension: Secondary | ICD-10-CM | POA: Diagnosis not present

## 2017-09-28 DIAGNOSIS — E668 Other obesity: Secondary | ICD-10-CM | POA: Diagnosis not present

## 2017-09-28 DIAGNOSIS — I350 Nonrheumatic aortic (valve) stenosis: Secondary | ICD-10-CM | POA: Diagnosis not present

## 2017-09-28 DIAGNOSIS — F5221 Male erectile disorder: Secondary | ICD-10-CM | POA: Diagnosis not present

## 2017-09-28 DIAGNOSIS — I517 Cardiomegaly: Secondary | ICD-10-CM | POA: Diagnosis not present

## 2017-09-28 DIAGNOSIS — E7800 Pure hypercholesterolemia, unspecified: Secondary | ICD-10-CM | POA: Diagnosis not present

## 2017-09-28 DIAGNOSIS — Z6831 Body mass index (BMI) 31.0-31.9, adult: Secondary | ICD-10-CM | POA: Diagnosis not present

## 2017-09-28 DIAGNOSIS — I6523 Occlusion and stenosis of bilateral carotid arteries: Secondary | ICD-10-CM | POA: Diagnosis not present

## 2017-10-14 DIAGNOSIS — I6523 Occlusion and stenosis of bilateral carotid arteries: Secondary | ICD-10-CM | POA: Diagnosis not present

## 2017-10-21 DIAGNOSIS — I350 Nonrheumatic aortic (valve) stenosis: Secondary | ICD-10-CM | POA: Diagnosis not present

## 2017-10-21 DIAGNOSIS — I10 Essential (primary) hypertension: Secondary | ICD-10-CM | POA: Diagnosis not present

## 2017-10-21 DIAGNOSIS — I6523 Occlusion and stenosis of bilateral carotid arteries: Secondary | ICD-10-CM | POA: Diagnosis not present

## 2017-10-21 DIAGNOSIS — Z0189 Encounter for other specified special examinations: Secondary | ICD-10-CM | POA: Diagnosis not present

## 2017-10-22 DIAGNOSIS — M9905 Segmental and somatic dysfunction of pelvic region: Secondary | ICD-10-CM | POA: Diagnosis not present

## 2017-10-22 DIAGNOSIS — M5414 Radiculopathy, thoracic region: Secondary | ICD-10-CM | POA: Diagnosis not present

## 2017-10-22 DIAGNOSIS — M9902 Segmental and somatic dysfunction of thoracic region: Secondary | ICD-10-CM | POA: Diagnosis not present

## 2017-10-22 DIAGNOSIS — M9903 Segmental and somatic dysfunction of lumbar region: Secondary | ICD-10-CM | POA: Diagnosis not present

## 2017-10-22 DIAGNOSIS — M5417 Radiculopathy, lumbosacral region: Secondary | ICD-10-CM | POA: Diagnosis not present

## 2017-10-22 DIAGNOSIS — M5136 Other intervertebral disc degeneration, lumbar region: Secondary | ICD-10-CM | POA: Diagnosis not present

## 2017-10-27 DIAGNOSIS — H524 Presbyopia: Secondary | ICD-10-CM | POA: Diagnosis not present

## 2017-10-27 DIAGNOSIS — H31001 Unspecified chorioretinal scars, right eye: Secondary | ICD-10-CM | POA: Diagnosis not present

## 2017-10-27 DIAGNOSIS — H43813 Vitreous degeneration, bilateral: Secondary | ICD-10-CM | POA: Diagnosis not present

## 2017-10-27 DIAGNOSIS — H532 Diplopia: Secondary | ICD-10-CM | POA: Diagnosis not present

## 2017-11-19 DIAGNOSIS — M5414 Radiculopathy, thoracic region: Secondary | ICD-10-CM | POA: Diagnosis not present

## 2017-11-19 DIAGNOSIS — M5417 Radiculopathy, lumbosacral region: Secondary | ICD-10-CM | POA: Diagnosis not present

## 2017-11-19 DIAGNOSIS — M9902 Segmental and somatic dysfunction of thoracic region: Secondary | ICD-10-CM | POA: Diagnosis not present

## 2017-11-19 DIAGNOSIS — M9905 Segmental and somatic dysfunction of pelvic region: Secondary | ICD-10-CM | POA: Diagnosis not present

## 2017-11-19 DIAGNOSIS — M9903 Segmental and somatic dysfunction of lumbar region: Secondary | ICD-10-CM | POA: Diagnosis not present

## 2017-11-19 DIAGNOSIS — M5136 Other intervertebral disc degeneration, lumbar region: Secondary | ICD-10-CM | POA: Diagnosis not present

## 2017-12-09 DIAGNOSIS — M5417 Radiculopathy, lumbosacral region: Secondary | ICD-10-CM | POA: Diagnosis not present

## 2017-12-09 DIAGNOSIS — M9903 Segmental and somatic dysfunction of lumbar region: Secondary | ICD-10-CM | POA: Diagnosis not present

## 2017-12-09 DIAGNOSIS — M5136 Other intervertebral disc degeneration, lumbar region: Secondary | ICD-10-CM | POA: Diagnosis not present

## 2017-12-09 DIAGNOSIS — M9902 Segmental and somatic dysfunction of thoracic region: Secondary | ICD-10-CM | POA: Diagnosis not present

## 2017-12-09 DIAGNOSIS — M9905 Segmental and somatic dysfunction of pelvic region: Secondary | ICD-10-CM | POA: Diagnosis not present

## 2017-12-09 DIAGNOSIS — M5414 Radiculopathy, thoracic region: Secondary | ICD-10-CM | POA: Diagnosis not present

## 2017-12-22 DIAGNOSIS — C44311 Basal cell carcinoma of skin of nose: Secondary | ICD-10-CM | POA: Diagnosis not present

## 2017-12-22 DIAGNOSIS — Z1283 Encounter for screening for malignant neoplasm of skin: Secondary | ICD-10-CM | POA: Diagnosis not present

## 2017-12-22 DIAGNOSIS — L821 Other seborrheic keratosis: Secondary | ICD-10-CM | POA: Diagnosis not present

## 2017-12-22 DIAGNOSIS — L570 Actinic keratosis: Secondary | ICD-10-CM | POA: Diagnosis not present

## 2017-12-22 DIAGNOSIS — L820 Inflamed seborrheic keratosis: Secondary | ICD-10-CM | POA: Diagnosis not present

## 2017-12-22 DIAGNOSIS — X32XXXD Exposure to sunlight, subsequent encounter: Secondary | ICD-10-CM | POA: Diagnosis not present

## 2017-12-24 DIAGNOSIS — M9902 Segmental and somatic dysfunction of thoracic region: Secondary | ICD-10-CM | POA: Diagnosis not present

## 2017-12-24 DIAGNOSIS — M9903 Segmental and somatic dysfunction of lumbar region: Secondary | ICD-10-CM | POA: Diagnosis not present

## 2017-12-24 DIAGNOSIS — M5414 Radiculopathy, thoracic region: Secondary | ICD-10-CM | POA: Diagnosis not present

## 2017-12-24 DIAGNOSIS — M5136 Other intervertebral disc degeneration, lumbar region: Secondary | ICD-10-CM | POA: Diagnosis not present

## 2017-12-24 DIAGNOSIS — M9905 Segmental and somatic dysfunction of pelvic region: Secondary | ICD-10-CM | POA: Diagnosis not present

## 2017-12-24 DIAGNOSIS — M5417 Radiculopathy, lumbosacral region: Secondary | ICD-10-CM | POA: Diagnosis not present

## 2018-01-06 IMAGING — CR DG CHEST 2V
2 series · 2 of 2 positions shown · non-contrast
Comparison: Chest radiograph dated 06/09/2007

CLINICAL DATA: 84-year-old male with chest tightness.

EXAM:
CHEST  2 VIEW

[chest pa]
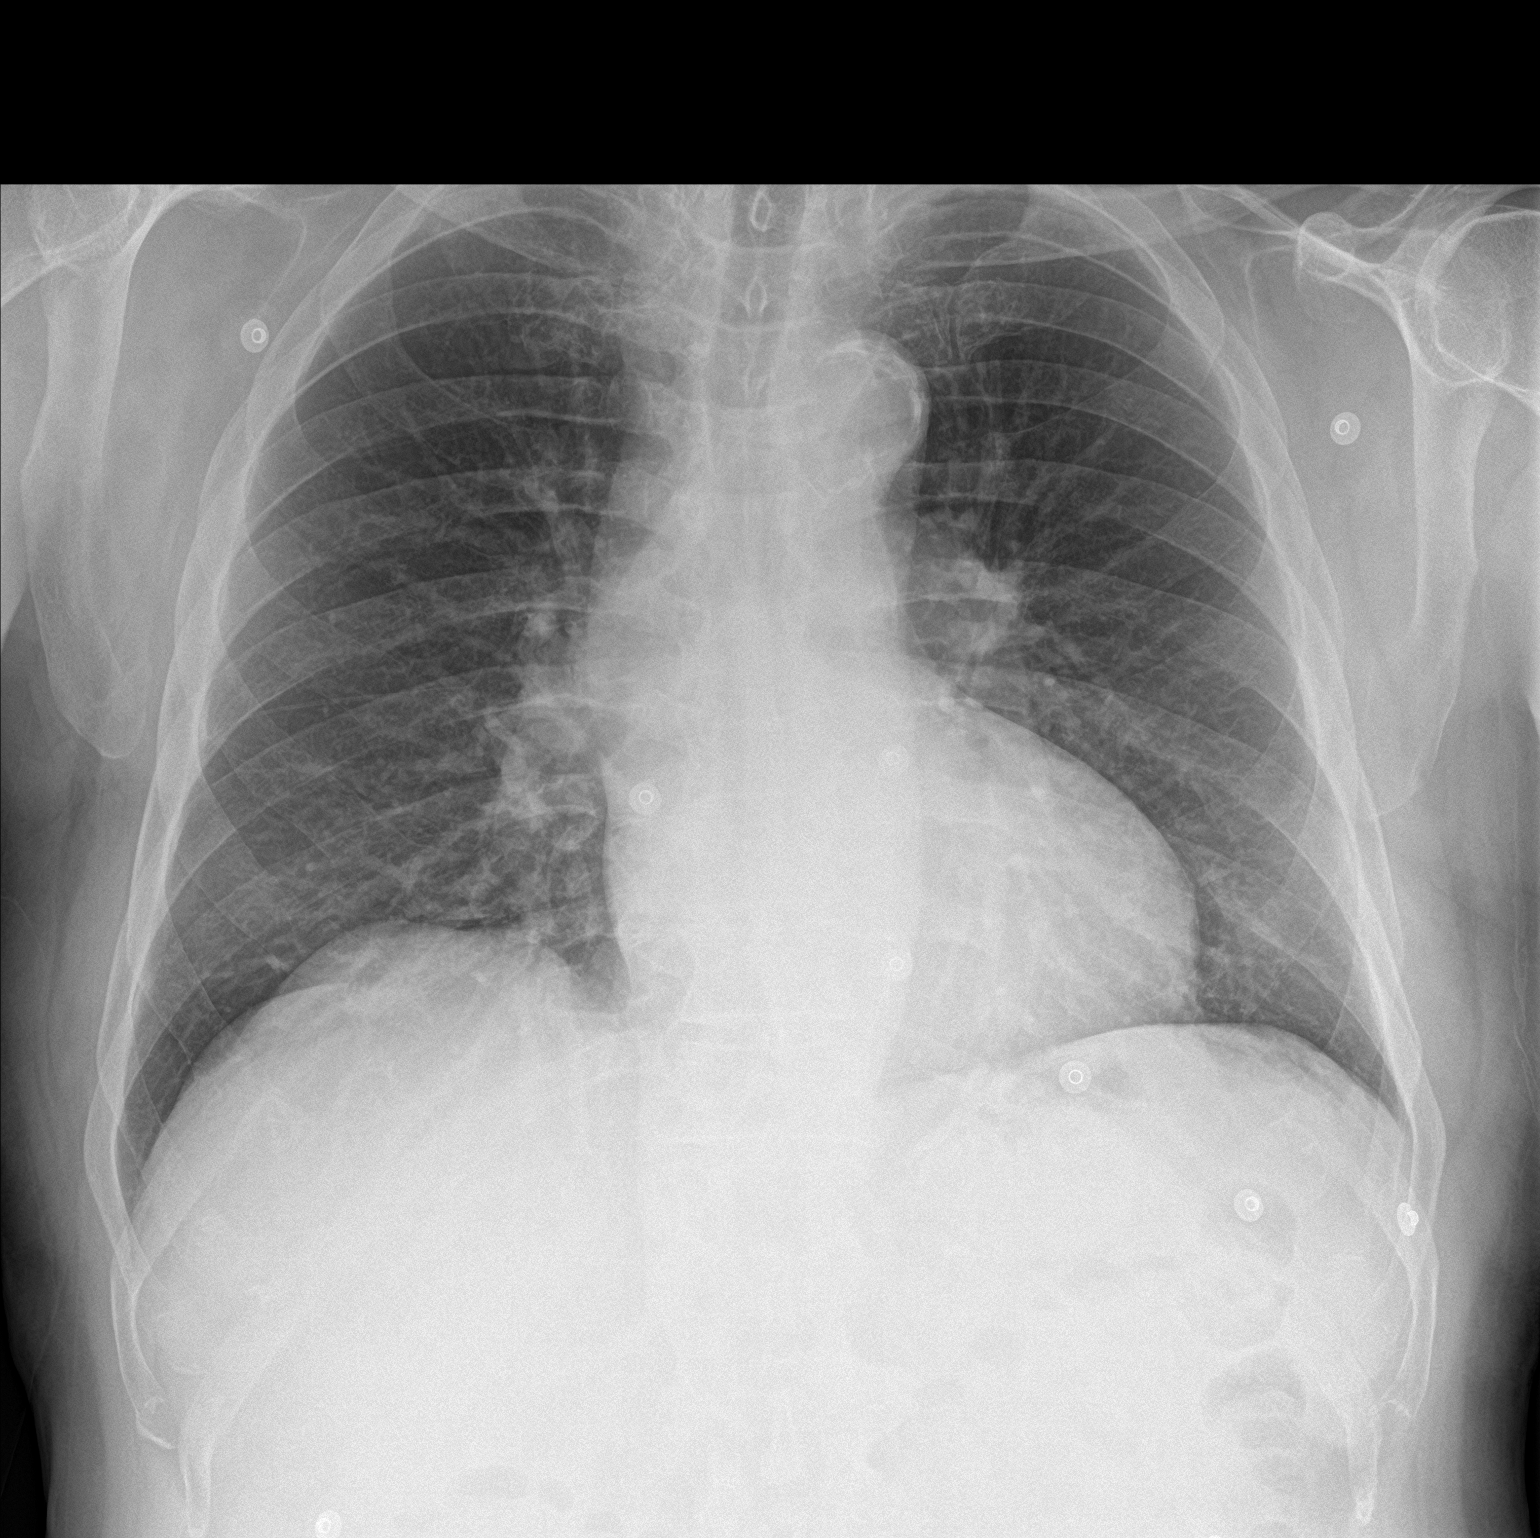

[chest lat]
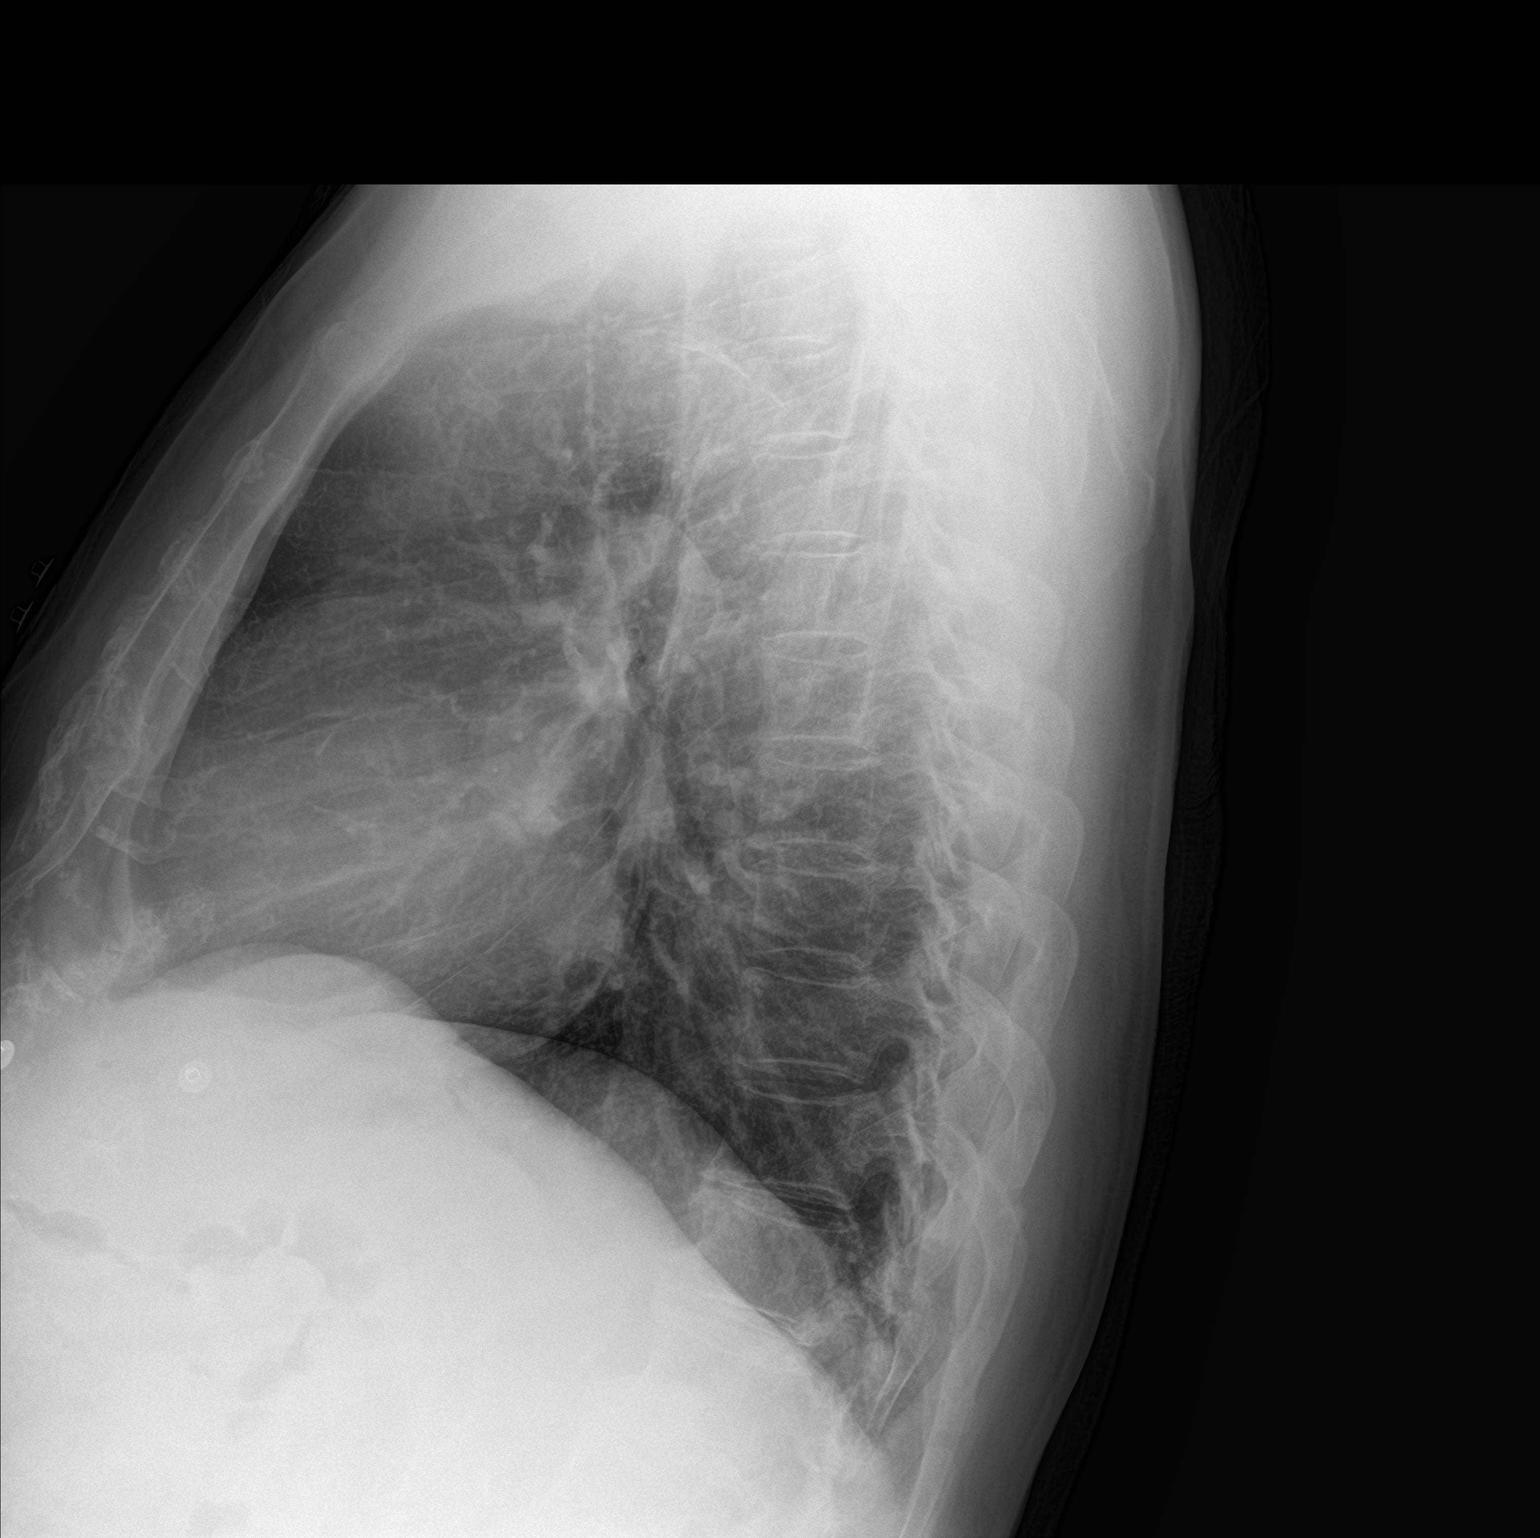

[2 of 2 positions shown; findings below may reference images not displayed]

FINDINGS: The lungs are clear. There is no pleural effusion or pneumothorax.
Stable cardiac silhouette. There is atherosclerotic calcification of
the aortic arch. No acute osseous pathology.
IMPRESSION: No active cardiopulmonary disease.

## 2018-01-21 DIAGNOSIS — M9905 Segmental and somatic dysfunction of pelvic region: Secondary | ICD-10-CM | POA: Diagnosis not present

## 2018-01-21 DIAGNOSIS — M5417 Radiculopathy, lumbosacral region: Secondary | ICD-10-CM | POA: Diagnosis not present

## 2018-01-21 DIAGNOSIS — M9902 Segmental and somatic dysfunction of thoracic region: Secondary | ICD-10-CM | POA: Diagnosis not present

## 2018-01-21 DIAGNOSIS — M5136 Other intervertebral disc degeneration, lumbar region: Secondary | ICD-10-CM | POA: Diagnosis not present

## 2018-01-21 DIAGNOSIS — M9903 Segmental and somatic dysfunction of lumbar region: Secondary | ICD-10-CM | POA: Diagnosis not present

## 2018-01-21 DIAGNOSIS — M5414 Radiculopathy, thoracic region: Secondary | ICD-10-CM | POA: Diagnosis not present

## 2018-02-03 DIAGNOSIS — L821 Other seborrheic keratosis: Secondary | ICD-10-CM | POA: Diagnosis not present

## 2018-02-03 DIAGNOSIS — X32XXXD Exposure to sunlight, subsequent encounter: Secondary | ICD-10-CM | POA: Diagnosis not present

## 2018-02-03 DIAGNOSIS — Z08 Encounter for follow-up examination after completed treatment for malignant neoplasm: Secondary | ICD-10-CM | POA: Diagnosis not present

## 2018-02-03 DIAGNOSIS — L570 Actinic keratosis: Secondary | ICD-10-CM | POA: Diagnosis not present

## 2018-02-03 DIAGNOSIS — Z85828 Personal history of other malignant neoplasm of skin: Secondary | ICD-10-CM | POA: Diagnosis not present

## 2018-02-15 DIAGNOSIS — M9903 Segmental and somatic dysfunction of lumbar region: Secondary | ICD-10-CM | POA: Diagnosis not present

## 2018-02-15 DIAGNOSIS — M5417 Radiculopathy, lumbosacral region: Secondary | ICD-10-CM | POA: Diagnosis not present

## 2018-02-15 DIAGNOSIS — M9902 Segmental and somatic dysfunction of thoracic region: Secondary | ICD-10-CM | POA: Diagnosis not present

## 2018-02-15 DIAGNOSIS — M5414 Radiculopathy, thoracic region: Secondary | ICD-10-CM | POA: Diagnosis not present

## 2018-02-15 DIAGNOSIS — M5136 Other intervertebral disc degeneration, lumbar region: Secondary | ICD-10-CM | POA: Diagnosis not present

## 2018-02-15 DIAGNOSIS — M9905 Segmental and somatic dysfunction of pelvic region: Secondary | ICD-10-CM | POA: Diagnosis not present

## 2018-03-08 DIAGNOSIS — Z125 Encounter for screening for malignant neoplasm of prostate: Secondary | ICD-10-CM | POA: Diagnosis not present

## 2018-03-08 DIAGNOSIS — I10 Essential (primary) hypertension: Secondary | ICD-10-CM | POA: Diagnosis not present

## 2018-03-08 DIAGNOSIS — E7800 Pure hypercholesterolemia, unspecified: Secondary | ICD-10-CM | POA: Diagnosis not present

## 2018-03-10 DIAGNOSIS — X32XXXD Exposure to sunlight, subsequent encounter: Secondary | ICD-10-CM | POA: Diagnosis not present

## 2018-03-10 DIAGNOSIS — D040 Carcinoma in situ of skin of lip: Secondary | ICD-10-CM | POA: Diagnosis not present

## 2018-03-10 DIAGNOSIS — L570 Actinic keratosis: Secondary | ICD-10-CM | POA: Diagnosis not present

## 2018-03-15 DIAGNOSIS — I350 Nonrheumatic aortic (valve) stenosis: Secondary | ICD-10-CM | POA: Diagnosis not present

## 2018-03-15 DIAGNOSIS — Z1389 Encounter for screening for other disorder: Secondary | ICD-10-CM | POA: Diagnosis not present

## 2018-03-15 DIAGNOSIS — Z23 Encounter for immunization: Secondary | ICD-10-CM | POA: Diagnosis not present

## 2018-03-15 DIAGNOSIS — Z0000 Encounter for general adult medical examination without abnormal findings: Secondary | ICD-10-CM | POA: Diagnosis not present

## 2018-03-15 DIAGNOSIS — E668 Other obesity: Secondary | ICD-10-CM | POA: Diagnosis not present

## 2018-03-15 DIAGNOSIS — D126 Benign neoplasm of colon, unspecified: Secondary | ICD-10-CM | POA: Diagnosis not present

## 2018-03-15 DIAGNOSIS — I10 Essential (primary) hypertension: Secondary | ICD-10-CM | POA: Diagnosis not present

## 2018-03-15 DIAGNOSIS — E7800 Pure hypercholesterolemia, unspecified: Secondary | ICD-10-CM | POA: Diagnosis not present

## 2018-03-15 DIAGNOSIS — I6523 Occlusion and stenosis of bilateral carotid arteries: Secondary | ICD-10-CM | POA: Diagnosis not present

## 2018-03-15 DIAGNOSIS — F5221 Male erectile disorder: Secondary | ICD-10-CM | POA: Diagnosis not present

## 2018-03-15 DIAGNOSIS — I517 Cardiomegaly: Secondary | ICD-10-CM | POA: Diagnosis not present

## 2018-03-15 DIAGNOSIS — Z6831 Body mass index (BMI) 31.0-31.9, adult: Secondary | ICD-10-CM | POA: Diagnosis not present

## 2018-03-15 DIAGNOSIS — R0981 Nasal congestion: Secondary | ICD-10-CM | POA: Diagnosis not present

## 2018-03-16 DIAGNOSIS — M5136 Other intervertebral disc degeneration, lumbar region: Secondary | ICD-10-CM | POA: Diagnosis not present

## 2018-03-16 DIAGNOSIS — M9902 Segmental and somatic dysfunction of thoracic region: Secondary | ICD-10-CM | POA: Diagnosis not present

## 2018-03-16 DIAGNOSIS — M9903 Segmental and somatic dysfunction of lumbar region: Secondary | ICD-10-CM | POA: Diagnosis not present

## 2018-03-16 DIAGNOSIS — Z1212 Encounter for screening for malignant neoplasm of rectum: Secondary | ICD-10-CM | POA: Diagnosis not present

## 2018-03-16 DIAGNOSIS — M5414 Radiculopathy, thoracic region: Secondary | ICD-10-CM | POA: Diagnosis not present

## 2018-03-16 DIAGNOSIS — M5417 Radiculopathy, lumbosacral region: Secondary | ICD-10-CM | POA: Diagnosis not present

## 2018-03-16 DIAGNOSIS — M9905 Segmental and somatic dysfunction of pelvic region: Secondary | ICD-10-CM | POA: Diagnosis not present

## 2018-03-27 ENCOUNTER — Other Ambulatory Visit: Payer: Self-pay | Admitting: Cardiology

## 2018-03-27 DIAGNOSIS — I6523 Occlusion and stenosis of bilateral carotid arteries: Secondary | ICD-10-CM

## 2018-03-27 DIAGNOSIS — R0989 Other specified symptoms and signs involving the circulatory and respiratory systems: Secondary | ICD-10-CM

## 2018-04-07 DIAGNOSIS — Z08 Encounter for follow-up examination after completed treatment for malignant neoplasm: Secondary | ICD-10-CM | POA: Diagnosis not present

## 2018-04-07 DIAGNOSIS — Z85828 Personal history of other malignant neoplasm of skin: Secondary | ICD-10-CM | POA: Diagnosis not present

## 2018-04-08 ENCOUNTER — Encounter: Payer: Self-pay | Admitting: Cardiology

## 2018-04-08 ENCOUNTER — Ambulatory Visit (INDEPENDENT_AMBULATORY_CARE_PROVIDER_SITE_OTHER): Payer: Medicare Other | Admitting: Cardiology

## 2018-04-08 VITALS — BP 134/65 | HR 72 | Ht 70.0 in | Wt 219.4 lb

## 2018-04-08 DIAGNOSIS — I350 Nonrheumatic aortic (valve) stenosis: Secondary | ICD-10-CM | POA: Insufficient documentation

## 2018-04-08 DIAGNOSIS — I10 Essential (primary) hypertension: Secondary | ICD-10-CM

## 2018-04-08 DIAGNOSIS — I6523 Occlusion and stenosis of bilateral carotid arteries: Secondary | ICD-10-CM | POA: Diagnosis not present

## 2018-04-08 NOTE — Progress Notes (Signed)
Patient is here for follow up visit.  Subjective:   @Patient  ID: [name] [name], male    DOB: 10/03/1931, 85 y.o.   MRN: [id number]  Chief Complaint  Patient presents with  . Follow-up  . Hypertension    HPI  85 year old Caucasian male with controlled hypertension, moderate to severe aortic stenosis, bilateral asymptomatic carotid stenosis.  Patient is here for 6 month follow up with his wife. He continues to be very active, works out 3 times a week at [employer]. This includes biking 5 miles in 20 min, 40 min weight training. He also dances regularly with his wife. With this level of activity, he denies chest pain, shortness of breath, palpitations, leg edema, orthopnea, PND, TIA/syncope. He is compliant with his medical therapy. He takes aspirin 81 mg daily without any bleeding issues. He had colonoscopy till age 26, which were reportedly normal. He recently had a guiac study that was negative.   Past Medical History:  Diagnosis Date  . Acid reflux   . Aortic stenosis   . Bilateral carotid bruits   . Carotid artery occlusion   . Exertional dyspnea   . Hypertension     Past Surgical History:  Procedure Laterality Date  . HERNIA REPAIR N/A   . MENISCUS REPAIR N/A     Social History   Socioeconomic History  . Marital status: Married    Spouse name: Not on file  . Number of children: 2  . Years of education: Not on file  . Highest education level: Not on file  Occupational History  . Not on file  Social Needs  . Financial resource strain: Not on file  . Food insecurity:    Worry: Not on file    Inability: Not on file  . Transportation needs:    Medical: Not on file    Non-medical: Not on file  Tobacco Use  . Smoking status: Former Smoker    Packs/day: 1.00    Types: Cigarettes    Last attempt to quit: 1990    Years since quitting: 30.1  . Smokeless tobacco: Never Used  Substance and Sexual Activity  . Alcohol use: Yes    Comment: Drinks wine daily   . Drug  use: No  . Sexual activity: Not on file  Lifestyle  . Physical activity:    Days per week: Not on file    Minutes per session: Not on file  . Stress: Not on file  Relationships  . Social connections:    Talks on phone: Not on file    Gets together: Not on file    Attends religious service: Not on file    Active member of club or organization: Not on file    Attends meetings of clubs or organizations: Not on file    Relationship status: Not on file  . Intimate partner violence:    Fear of current or ex partner: Not on file    Emotionally abused: Not on file    Physically abused: Not on file    Forced sexual activity: Not on file  Other Topics Concern  . Not on file  Social History Narrative  . Not on file    Current Outpatient Medications on File Prior to Visit  Medication Sig Dispense Refill  . amLODipine (NORVASC) 5 MG tablet Take 5 mg by mouth daily.    Marland Kitchen aspirin EC 81 MG tablet Take 1 tablet (81 mg total) by mouth daily. 30 tablet 2  .  B-COMPLEX-C PO Take by mouth daily.    . Cholecalciferol (VITAMIN D PO) Take 1 tablet by mouth daily.    . clobetasol (TEMOVATE) 0.05 % external solution prn    . fluticasone (FLONASE) 50 MCG/ACT nasal spray continuous as needed.    . Glucosamine-Chondroitin-MSM (TRIPLE FLEX PO) Take by mouth daily.    Marland Kitchen ipratropium (ATROVENT) 0.03 % nasal spray continuous as needed.    Marland Kitchen L-Lysine 500 MG CAPS Take 500 mg by mouth daily.    . Misc Natural Products (PROSTATE CONTROL PO) Take by mouth daily.    . Multiple Vitamin (MULTIVITAMIN WITH MINERALS) TABS tablet Take 1 tablet by mouth daily.    . Multiple Vitamin (MULTIVITAMIN) tablet Take 1 tablet by mouth daily.    . rosuvastatin (CRESTOR) 20 MG tablet Take 20 mg by mouth daily.     No current facility-administered medications on file prior to visit.     Cardiovascular studies:  Echocardiogram 09/16/2017: Left ventricle cavity is normal in size. Moderate concentric hypertrophy of the left  ventricle. Normal global wall motion. Calculated EF 55%. Left atrial cavity is mildly dilated. Functionally bicuspid aortic valve with moderate leaflet and annular calcification. Moderate aortic valve stenosis. Aortic valve mean gradient of 26 mmHg, Vmax of 3.8 m/s. Calculated aortic valve area by continuity equation is 1.5 cm, AVAi 0.68 cm2/m2. No significant aortic regurgitation. Mild to moderate mitral regurgitation. Mild to moderate tricuspid regurgitation. Estimated pulmonary artery systolic pressure 30 mmHg. No significant worsening of aortic stenosis compared to previous study in 03/2008.  Treadmill exercise stress 03/27/2017: Indication: Aortic Stenosis, SoB The patient exercised on Bruce protocol for 6:44 min. Patient achieved 8.17 METS and reached HR 148 bpm, which is 108% of maximum age-predicted HR. Stress test terminated due to Fatigue. BP Response to Exercise: Resting hypertension- appropriate response. Resting 152/94 and peak 204/106 mm Hg. Resting EKG demonstrates Normal sinus rhythm. ST Changes: With peak exercise there was no ST-T changes of ischemia. Arrhythmias: ventricular premature beats-isolated. Chest Pain: none. Exercise capacity was normal for age. Continue primary/secondary prevention.  Carotid artery duplex 10/14/2017: Doppler flow velocities in the right internal carotid artery (ICA) are consistent with stenosis in the range of 16-49%, lower end of spectrum with moderate heterogeneous plaque. Doppler flow velocities in the left internal carotid artery (ICA) are consistent with stenosis in the range of 50-69%, lower end of spectrum with >= 50% heterogeneous plaque. Antegrade right vertebral artery flow. Antegrade left vertebral artery flow. Compared to the study done on 02/30/2019, no significant change. Follow up in six months is appropriate if clinically indicated.  Review of Systems  Constitution: Negative for decreased appetite, malaise/fatigue, weight  gain and weight loss.  HENT: Negative for congestion.   Eyes: Negative for visual disturbance.  Cardiovascular: Negative for chest pain, claudication, dyspnea on exertion, leg swelling and syncope.  Respiratory: Negative for shortness of breath.   Endocrine: Negative for cold intolerance.  Hematologic/Lymphatic: Does not bruise/bleed easily.  Skin: Negative for itching and rash.  Musculoskeletal: Negative for myalgias.  Gastrointestinal: Negative for abdominal pain, nausea and vomiting.  Genitourinary: Negative for dysuria.  Neurological: Negative for dizziness and weakness.  Psychiatric/Behavioral: The patient is not nervous/anxious.   All other systems reviewed and are negative.      Objective:   Vitals:   04/08/18 1018  BP: 134/65  Pulse: 72  SpO2: 96%     Physical Exam  Constitutional: He is oriented to person, place, and time. He appears well-developed and well-nourished. No distress.  HENT:  Head: Normocephalic and atraumatic.  Eyes: Pupils are equal, round, and reactive to light. Conjunctivae are normal.  Neck: No JVD present.  Cardiovascular: Normal rate, regular rhythm and intact distal pulses.  Murmur (III/VI crescendo decrescendo mur,mur RUSB) heard. Pulmonary/Chest: Effort normal and breath sounds normal. He has no wheezes. He has no rales.  Abdominal: Soft. Bowel sounds are normal. There is no rebound.  Musculoskeletal:        General: No edema.  Lymphadenopathy:    He has no cervical adenopathy.  Neurological: He is alert and oriented to person, place, and time. No cranial nerve deficit.  Skin: Skin is warm and dry.  Psychiatric: He has a normal mood and affect.  Nursing note and vitals reviewed.       Assessment & Recommendations:   85 year old Caucasian male with controlled hypertension, moderate to severe aortic stenosis, bilateral asymptomatic carotid stenosis.    ICD-10-CM   1. Moderate aortic stenosis: EKG 04/08/2018: Sinus  Rhythm  -  occasional ectopic ventricular beat    Otherwise normal EKG  Stable, asymptomatic. Repeat echocardiogram in 09/2018 I35.0 EKG 12-Lead  2. Carotid stenosis, asymptomatic, bilateral: Continue aspirin/statin. Repeat carotid US 09/2018  I65.23   3. Essential hypertension: Controlled. No changes made today. I10    I will see him back in 6 months after echocardiogram, carotid US.    [name], MD [facility] [place] Pager: [phone] Office: [phone] If no answer Cell [phone]

## 2018-04-15 DIAGNOSIS — M5414 Radiculopathy, thoracic region: Secondary | ICD-10-CM | POA: Diagnosis not present

## 2018-04-15 DIAGNOSIS — M9905 Segmental and somatic dysfunction of pelvic region: Secondary | ICD-10-CM | POA: Diagnosis not present

## 2018-04-15 DIAGNOSIS — M9902 Segmental and somatic dysfunction of thoracic region: Secondary | ICD-10-CM | POA: Diagnosis not present

## 2018-04-15 DIAGNOSIS — M5417 Radiculopathy, lumbosacral region: Secondary | ICD-10-CM | POA: Diagnosis not present

## 2018-04-15 DIAGNOSIS — M5136 Other intervertebral disc degeneration, lumbar region: Secondary | ICD-10-CM | POA: Diagnosis not present

## 2018-04-15 DIAGNOSIS — M9903 Segmental and somatic dysfunction of lumbar region: Secondary | ICD-10-CM | POA: Diagnosis not present

## 2018-04-21 ENCOUNTER — Ambulatory Visit: Payer: Medicare Other

## 2018-04-21 DIAGNOSIS — R0989 Other specified symptoms and signs involving the circulatory and respiratory systems: Secondary | ICD-10-CM | POA: Diagnosis not present

## 2018-04-23 DIAGNOSIS — M25572 Pain in left ankle and joints of left foot: Secondary | ICD-10-CM | POA: Diagnosis not present

## 2018-04-26 ENCOUNTER — Telehealth: Payer: Self-pay | Admitting: Cardiology

## 2018-04-27 ENCOUNTER — Other Ambulatory Visit: Payer: Self-pay | Admitting: Cardiology

## 2018-04-27 DIAGNOSIS — R0989 Other specified symptoms and signs involving the circulatory and respiratory systems: Secondary | ICD-10-CM

## 2018-04-27 NOTE — Telephone Encounter (Signed)
Pt is scheduled for Monday July 27 @ 10:00. Please inform pt of appt when results are given.

## 2018-06-03 DIAGNOSIS — M9903 Segmental and somatic dysfunction of lumbar region: Secondary | ICD-10-CM | POA: Diagnosis not present

## 2018-06-03 DIAGNOSIS — M9905 Segmental and somatic dysfunction of pelvic region: Secondary | ICD-10-CM | POA: Diagnosis not present

## 2018-06-03 DIAGNOSIS — M5417 Radiculopathy, lumbosacral region: Secondary | ICD-10-CM | POA: Diagnosis not present

## 2018-06-03 DIAGNOSIS — M5414 Radiculopathy, thoracic region: Secondary | ICD-10-CM | POA: Diagnosis not present

## 2018-06-03 DIAGNOSIS — M5136 Other intervertebral disc degeneration, lumbar region: Secondary | ICD-10-CM | POA: Diagnosis not present

## 2018-06-03 DIAGNOSIS — M9902 Segmental and somatic dysfunction of thoracic region: Secondary | ICD-10-CM | POA: Diagnosis not present

## 2018-06-18 ENCOUNTER — Other Ambulatory Visit: Payer: Self-pay | Admitting: Cardiology

## 2018-06-18 DIAGNOSIS — I350 Nonrheumatic aortic (valve) stenosis: Secondary | ICD-10-CM

## 2018-07-20 DIAGNOSIS — Z1283 Encounter for screening for malignant neoplasm of skin: Secondary | ICD-10-CM | POA: Diagnosis not present

## 2018-07-20 DIAGNOSIS — Z86008 Personal history of in-situ neoplasm of other site: Secondary | ICD-10-CM | POA: Diagnosis not present

## 2018-07-20 DIAGNOSIS — Z08 Encounter for follow-up examination after completed treatment for malignant neoplasm: Secondary | ICD-10-CM | POA: Diagnosis not present

## 2018-07-20 DIAGNOSIS — D225 Melanocytic nevi of trunk: Secondary | ICD-10-CM | POA: Diagnosis not present

## 2018-07-20 DIAGNOSIS — C44729 Squamous cell carcinoma of skin of left lower limb, including hip: Secondary | ICD-10-CM | POA: Diagnosis not present

## 2018-08-11 DIAGNOSIS — Z85828 Personal history of other malignant neoplasm of skin: Secondary | ICD-10-CM | POA: Diagnosis not present

## 2018-08-11 DIAGNOSIS — L980 Pyogenic granuloma: Secondary | ICD-10-CM | POA: Diagnosis not present

## 2018-08-11 DIAGNOSIS — Z08 Encounter for follow-up examination after completed treatment for malignant neoplasm: Secondary | ICD-10-CM | POA: Diagnosis not present

## 2018-09-13 ENCOUNTER — Other Ambulatory Visit: Payer: Medicare Other

## 2018-09-14 DIAGNOSIS — M5417 Radiculopathy, lumbosacral region: Secondary | ICD-10-CM | POA: Diagnosis not present

## 2018-09-14 DIAGNOSIS — M5136 Other intervertebral disc degeneration, lumbar region: Secondary | ICD-10-CM | POA: Diagnosis not present

## 2018-09-14 DIAGNOSIS — M9902 Segmental and somatic dysfunction of thoracic region: Secondary | ICD-10-CM | POA: Diagnosis not present

## 2018-09-14 DIAGNOSIS — M9903 Segmental and somatic dysfunction of lumbar region: Secondary | ICD-10-CM | POA: Diagnosis not present

## 2018-09-14 DIAGNOSIS — M5414 Radiculopathy, thoracic region: Secondary | ICD-10-CM | POA: Diagnosis not present

## 2018-09-14 DIAGNOSIS — M9905 Segmental and somatic dysfunction of pelvic region: Secondary | ICD-10-CM | POA: Diagnosis not present

## 2018-09-20 DIAGNOSIS — M9902 Segmental and somatic dysfunction of thoracic region: Secondary | ICD-10-CM | POA: Diagnosis not present

## 2018-09-20 DIAGNOSIS — M9905 Segmental and somatic dysfunction of pelvic region: Secondary | ICD-10-CM | POA: Diagnosis not present

## 2018-09-20 DIAGNOSIS — M5417 Radiculopathy, lumbosacral region: Secondary | ICD-10-CM | POA: Diagnosis not present

## 2018-09-20 DIAGNOSIS — M5414 Radiculopathy, thoracic region: Secondary | ICD-10-CM | POA: Diagnosis not present

## 2018-09-20 DIAGNOSIS — M9903 Segmental and somatic dysfunction of lumbar region: Secondary | ICD-10-CM | POA: Diagnosis not present

## 2018-09-20 DIAGNOSIS — M5136 Other intervertebral disc degeneration, lumbar region: Secondary | ICD-10-CM | POA: Diagnosis not present

## 2018-09-23 DIAGNOSIS — I10 Essential (primary) hypertension: Secondary | ICD-10-CM | POA: Diagnosis not present

## 2018-09-23 DIAGNOSIS — I350 Nonrheumatic aortic (valve) stenosis: Secondary | ICD-10-CM | POA: Diagnosis not present

## 2018-09-23 DIAGNOSIS — I6523 Occlusion and stenosis of bilateral carotid arteries: Secondary | ICD-10-CM | POA: Diagnosis not present

## 2018-09-23 DIAGNOSIS — E669 Obesity, unspecified: Secondary | ICD-10-CM | POA: Diagnosis not present

## 2018-09-23 DIAGNOSIS — I517 Cardiomegaly: Secondary | ICD-10-CM | POA: Diagnosis not present

## 2018-09-23 DIAGNOSIS — E7800 Pure hypercholesterolemia, unspecified: Secondary | ICD-10-CM | POA: Diagnosis not present

## 2018-09-24 DIAGNOSIS — I10 Essential (primary) hypertension: Secondary | ICD-10-CM | POA: Diagnosis not present

## 2018-09-27 ENCOUNTER — Other Ambulatory Visit: Payer: Self-pay

## 2018-09-27 ENCOUNTER — Ambulatory Visit (INDEPENDENT_AMBULATORY_CARE_PROVIDER_SITE_OTHER): Payer: Medicare Other

## 2018-09-27 DIAGNOSIS — I350 Nonrheumatic aortic (valve) stenosis: Secondary | ICD-10-CM

## 2018-09-27 DIAGNOSIS — R0989 Other specified symptoms and signs involving the circulatory and respiratory systems: Secondary | ICD-10-CM | POA: Diagnosis not present

## 2018-09-28 DIAGNOSIS — M5136 Other intervertebral disc degeneration, lumbar region: Secondary | ICD-10-CM | POA: Diagnosis not present

## 2018-09-28 DIAGNOSIS — M9902 Segmental and somatic dysfunction of thoracic region: Secondary | ICD-10-CM | POA: Diagnosis not present

## 2018-09-28 DIAGNOSIS — M5414 Radiculopathy, thoracic region: Secondary | ICD-10-CM | POA: Diagnosis not present

## 2018-09-28 DIAGNOSIS — M9903 Segmental and somatic dysfunction of lumbar region: Secondary | ICD-10-CM | POA: Diagnosis not present

## 2018-09-28 DIAGNOSIS — M9905 Segmental and somatic dysfunction of pelvic region: Secondary | ICD-10-CM | POA: Diagnosis not present

## 2018-09-28 DIAGNOSIS — M5417 Radiculopathy, lumbosacral region: Secondary | ICD-10-CM | POA: Diagnosis not present

## 2018-09-29 ENCOUNTER — Other Ambulatory Visit: Payer: Self-pay | Admitting: Cardiology

## 2018-09-29 DIAGNOSIS — I6523 Occlusion and stenosis of bilateral carotid arteries: Secondary | ICD-10-CM

## 2018-10-04 ENCOUNTER — Ambulatory Visit: Payer: Medicare Other | Admitting: Cardiology

## 2018-10-25 ENCOUNTER — Other Ambulatory Visit: Payer: Self-pay

## 2018-10-25 ENCOUNTER — Encounter: Payer: Self-pay | Admitting: Cardiology

## 2018-10-25 ENCOUNTER — Ambulatory Visit (INDEPENDENT_AMBULATORY_CARE_PROVIDER_SITE_OTHER): Payer: Medicare Other | Admitting: Cardiology

## 2018-10-25 VITALS — BP 112/56 | HR 70 | Ht 70.0 in | Wt 213.6 lb

## 2018-10-25 DIAGNOSIS — I350 Nonrheumatic aortic (valve) stenosis: Secondary | ICD-10-CM

## 2018-10-25 DIAGNOSIS — I10 Essential (primary) hypertension: Secondary | ICD-10-CM

## 2018-10-25 DIAGNOSIS — I6523 Occlusion and stenosis of bilateral carotid arteries: Secondary | ICD-10-CM

## 2018-10-25 NOTE — Progress Notes (Signed)
Patient is here for follow up visit.  Subjective:   @Patient  ID: [name] [name], male    DOB: Oct 31, 1931, 85 y.o.   MRN: [id number]  Chief Complaint  Patient presents with  . moderate aortic stenosis  . Hypertension    HPI  85 year old Caucasian male with controlled hypertension, moderate to severe aortic stenosis, bilateral asymptomatic carotid stenosis.  While his echocardiogram in July 2020 shows aortic valve area of 0.9 cm, mean gradient is only 28 mmHg, with a V-max of 3.5 m/s.  Pain, without any symptoms of chest pain, shortness of breath, presyncope or syncope, leg edema, orthopnea, PND.  Past Medical History:  Diagnosis Date  . Acid reflux   . Aortic stenosis   . Bilateral carotid bruits   . Carotid artery occlusion   . Exertional dyspnea   . Hypertension     Past Surgical History:  Procedure Laterality Date  . HERNIA REPAIR N/A   . MENISCUS REPAIR N/A     Social History   Socioeconomic History  . Marital status: Married    Spouse name: Not on file  . Number of children: 2  . Years of education: Not on file  . Highest education level: Not on file  Occupational History  . Not on file  Social Needs  . Financial resource strain: Not on file  . Food insecurity    Worry: Not on file    Inability: Not on file  . Transportation needs    Medical: Not on file    Non-medical: Not on file  Tobacco Use  . Smoking status: Former Smoker    Packs/day: 1.00    Types: Cigarettes    Quit date: 1990    Years since quitting: 30.6  . Smokeless tobacco: Never Used  Substance and Sexual Activity  . Alcohol use: Yes    Comment: Drinks wine daily   . Drug use: No  . Sexual activity: Not on file  Lifestyle  . Physical activity    Days per week: Not on file    Minutes per session: Not on file  . Stress: Not on file  Relationships  . Social Herbalist on phone: Not on file    Gets together: Not on file    Attends religious service: Not on file   Active member of club or organization: Not on file    Attends meetings of clubs or organizations: Not on file    Relationship status: Not on file  . Intimate partner violence    Fear of current or ex partner: Not on file    Emotionally abused: Not on file    Physically abused: Not on file    Forced sexual activity: Not on file  Other Topics Concern  . Not on file  Social History Narrative  . Not on file    Current Outpatient Medications on File Prior to Visit  Medication Sig Dispense Refill  . amLODipine (NORVASC) 5 MG tablet Take 5 mg by mouth daily.    Marland Kitchen aspirin EC 81 MG tablet Take 1 tablet (81 mg total) by mouth daily. 30 tablet 2  . B-COMPLEX-C PO Take by mouth daily.    . Cholecalciferol (VITAMIN D PO) Take 1 tablet by mouth daily.    . clobetasol (TEMOVATE) 0.05 % external solution prn    . fluticasone (FLONASE) 50 MCG/ACT nasal spray continuous as needed.    . Glucosamine-Chondroitin-MSM (TRIPLE FLEX PO) Take by mouth daily.    Marland Kitchen  ipratropium (ATROVENT) 0.03 % nasal spray continuous as needed.    Marland Kitchen L-Lysine 500 MG CAPS Take 500 mg by mouth daily.    . Misc Natural Products (PROSTATE CONTROL PO) Take by mouth daily.    . Multiple Vitamin (MULTIVITAMIN WITH MINERALS) TABS tablet Take 1 tablet by mouth daily.    . Multiple Vitamin (MULTIVITAMIN) tablet Take 1 tablet by mouth daily.    . rosuvastatin (CRESTOR) 20 MG tablet Take 20 mg by mouth daily.     No current facility-administered medications on file prior to visit.     Cardiovascular studies:  EKG 10/25/2018: Sinus rhythm 70 bpm. Normal EKG.  Carotid artery duplex  09/27/2018: Stenosis in the right internal carotid artery (16-49%). Stenosis in the left internal carotid artery (>=70%). The left PSV internal/common carotid artery ratio is 4.21 and is consistent with a stenosis of >70%. Antegrade right vertebral artery flow. Antegrade left vertebral artery flow. Compared to the study done on 04/24/2018, left ICA  stenosis appears to have slightly progressed from less than 70%. Follow up in six months is appropriate if clinically indicated.  Echocardiogram 09/27/2018: 1. Normal LV systolic function with EF 60%. Left ventricle cavity is normal in size. Moderate concentric hypertrophy of the left ventricle. Normal global wall motion. Doppler evidence of grade I (impaired) diastolic dysfunction. 2. Left atrial cavity is moderately dilated at 4.9 cm. 3. No aortic valve regurgitation noted. Mild calcification of the aortic valve annulus. Moderate aortic valve leaflet calcification. Moderately restricted aortic valve leaflets. Moderate to severe aortic valve stenosis. AVA (VTI) measures 0.93 cm^2. AV Mean Grad measures 28.4 mmHg. AV Pk Vel measures 3.52 m/s. 4. Mild to moderate mitral regurgitation. Mild calcification of the mitral valve annulus. 5. Mild tricuspid regurgitation. No evidence of pulmonary hypertension. 6. c.f. echo. of 09/16/2017, there is very slight increase of aortic mean gradient but calculated valve area is smaller ( 0.93 cm2 now, it was 1.5 cm2 before)  Treadmill exercise stress 03/27/2017: Indication: Aortic Stenosis, SoB The patient exercised on Bruce protocol for 6:44 min. Patient achieved 8.17 METS and reached HR 148 bpm, which is 108% of maximum age-predicted HR. Stress test terminated due to Fatigue. BP Response to Exercise: Resting hypertension- appropriate response. Resting 152/94 and peak 204/106 mm Hg. Resting EKG demonstrates Normal sinus rhythm. ST Changes: With peak exercise there was no ST-T changes of ischemia. Arrhythmias: ventricular premature beats-isolated. Chest Pain: none. Exercise capacity was normal for age. Continue primary/secondary prevention.  Carotid artery duplex 10/14/2017: Doppler flow velocities in the right internal carotid artery (ICA) are consistent with stenosis in the range of 16-49%, lower end of spectrum with moderate heterogeneous plaque.  Doppler flow velocities in the left internal carotid artery (ICA) are consistent with stenosis in the range of 50-69%, lower end of spectrum with >= 50% heterogeneous plaque. Antegrade right vertebral artery flow. Antegrade left vertebral artery flow. Compared to the study done on 02/30/2019, no significant change. Follow up in six months is appropriate if clinically indicated.  Review of Systems  Constitution: Negative for decreased appetite, malaise/fatigue, weight gain and weight loss.  HENT: Negative for congestion.   Eyes: Negative for visual disturbance.  Cardiovascular: Negative for chest pain, claudication, dyspnea on exertion, leg swelling and syncope.  Respiratory: Negative for shortness of breath.   Endocrine: Negative for cold intolerance.  Hematologic/Lymphatic: Does not bruise/bleed easily.  Skin: Negative for itching and rash.  Musculoskeletal: Negative for myalgias.  Gastrointestinal: Negative for abdominal pain, nausea and vomiting.  Genitourinary: Negative  for dysuria.  Neurological: Negative for dizziness and weakness.  Psychiatric/Behavioral: The patient is not nervous/anxious.   All other systems reviewed and are negative.      Objective:    Vitals:   10/25/18 1449  BP: (!) 112/56  Pulse: 70  SpO2: 95%     Physical Exam  Constitutional: He is oriented to person, place, and time. He appears well-developed and well-nourished. No distress.  HENT:  Head: Normocephalic and atraumatic.  Eyes: Pupils are equal, round, and reactive to light. Conjunctivae are normal.  Neck: No JVD present.  Cardiovascular: Normal rate, regular rhythm and intact distal pulses.  Murmur (III/VI crescendo decrescendo mur,mur RUSB) heard. Pulmonary/Chest: Effort normal and breath sounds normal. He has no wheezes. He has no rales.  Abdominal: Soft. Bowel sounds are normal. There is no rebound.  Musculoskeletal:        General: No edema.  Lymphadenopathy:    He has no cervical  adenopathy.  Neurological: He is alert and oriented to person, place, and time. No cranial nerve deficit.  Skin: Skin is warm and dry.  Psychiatric: He has a normal mood and affect.  Nursing note and vitals reviewed.       Assessment & Recommendations:   85 year old Caucasian male with controlled hypertension, moderate to severe aortic stenosis, bilateral asymptomatic carotid stenosis.  Aortic stenosis: There is discrepancy between his mean gradient and V-max readings compared with his valve area and dimensionless index.  Based on review of systems and physical exam, I think his aortic stenosis is moderate and he does not need valve replacement at this time.  Continue serial monitoring with echocardiogram.  Repeat echocardiogram in 6 months with office visit follow-up.  Carotid artery stenosis: Bilateral, asymptomatic.  Mild progression compared to previous study. In absence of symptoms, continue aspirin and statin at this time.  Repeat carotid ultrasound.  Hypertension: Well-controlled.   [name], MD [facility]. [place] Pager: [phone] Office: [phone] If no answer Cell [phone]

## 2018-10-28 DIAGNOSIS — M9905 Segmental and somatic dysfunction of pelvic region: Secondary | ICD-10-CM | POA: Diagnosis not present

## 2018-10-28 DIAGNOSIS — M5417 Radiculopathy, lumbosacral region: Secondary | ICD-10-CM | POA: Diagnosis not present

## 2018-10-28 DIAGNOSIS — M5136 Other intervertebral disc degeneration, lumbar region: Secondary | ICD-10-CM | POA: Diagnosis not present

## 2018-10-28 DIAGNOSIS — M5414 Radiculopathy, thoracic region: Secondary | ICD-10-CM | POA: Diagnosis not present

## 2018-10-28 DIAGNOSIS — M9902 Segmental and somatic dysfunction of thoracic region: Secondary | ICD-10-CM | POA: Diagnosis not present

## 2018-10-28 DIAGNOSIS — M9903 Segmental and somatic dysfunction of lumbar region: Secondary | ICD-10-CM | POA: Diagnosis not present

## 2018-11-02 DIAGNOSIS — H52203 Unspecified astigmatism, bilateral: Secondary | ICD-10-CM | POA: Diagnosis not present

## 2018-11-02 DIAGNOSIS — Z961 Presence of intraocular lens: Secondary | ICD-10-CM | POA: Diagnosis not present

## 2018-11-02 DIAGNOSIS — H532 Diplopia: Secondary | ICD-10-CM | POA: Diagnosis not present

## 2018-11-02 DIAGNOSIS — H26493 Other secondary cataract, bilateral: Secondary | ICD-10-CM | POA: Diagnosis not present

## 2018-11-17 DIAGNOSIS — Z23 Encounter for immunization: Secondary | ICD-10-CM | POA: Diagnosis not present

## 2018-12-02 DIAGNOSIS — M9905 Segmental and somatic dysfunction of pelvic region: Secondary | ICD-10-CM | POA: Diagnosis not present

## 2018-12-02 DIAGNOSIS — M9902 Segmental and somatic dysfunction of thoracic region: Secondary | ICD-10-CM | POA: Diagnosis not present

## 2018-12-02 DIAGNOSIS — M9903 Segmental and somatic dysfunction of lumbar region: Secondary | ICD-10-CM | POA: Diagnosis not present

## 2018-12-02 DIAGNOSIS — M5414 Radiculopathy, thoracic region: Secondary | ICD-10-CM | POA: Diagnosis not present

## 2018-12-02 DIAGNOSIS — M5136 Other intervertebral disc degeneration, lumbar region: Secondary | ICD-10-CM | POA: Diagnosis not present

## 2018-12-02 DIAGNOSIS — M5417 Radiculopathy, lumbosacral region: Secondary | ICD-10-CM | POA: Diagnosis not present

## 2019-01-12 ENCOUNTER — Telehealth: Payer: Self-pay | Admitting: Cardiology

## 2019-03-14 DIAGNOSIS — Z125 Encounter for screening for malignant neoplasm of prostate: Secondary | ICD-10-CM | POA: Diagnosis not present

## 2019-03-21 DIAGNOSIS — F5221 Male erectile disorder: Secondary | ICD-10-CM | POA: Diagnosis not present

## 2019-03-21 DIAGNOSIS — D126 Benign neoplasm of colon, unspecified: Secondary | ICD-10-CM | POA: Diagnosis not present

## 2019-03-21 DIAGNOSIS — I350 Nonrheumatic aortic (valve) stenosis: Secondary | ICD-10-CM | POA: Diagnosis not present

## 2019-03-21 DIAGNOSIS — Z1331 Encounter for screening for depression: Secondary | ICD-10-CM | POA: Diagnosis not present

## 2019-03-21 DIAGNOSIS — I517 Cardiomegaly: Secondary | ICD-10-CM | POA: Diagnosis not present

## 2019-03-21 DIAGNOSIS — E669 Obesity, unspecified: Secondary | ICD-10-CM | POA: Diagnosis not present

## 2019-03-21 DIAGNOSIS — Z1339 Encounter for screening examination for other mental health and behavioral disorders: Secondary | ICD-10-CM | POA: Diagnosis not present

## 2019-03-21 DIAGNOSIS — I10 Essential (primary) hypertension: Secondary | ICD-10-CM | POA: Diagnosis not present

## 2019-03-21 DIAGNOSIS — I6523 Occlusion and stenosis of bilateral carotid arteries: Secondary | ICD-10-CM | POA: Diagnosis not present

## 2019-03-21 DIAGNOSIS — Z0000 Encounter for general adult medical examination without abnormal findings: Secondary | ICD-10-CM | POA: Diagnosis not present

## 2019-03-21 DIAGNOSIS — E7800 Pure hypercholesterolemia, unspecified: Secondary | ICD-10-CM | POA: Diagnosis not present

## 2019-03-23 DIAGNOSIS — M9903 Segmental and somatic dysfunction of lumbar region: Secondary | ICD-10-CM | POA: Diagnosis not present

## 2019-03-23 DIAGNOSIS — M9905 Segmental and somatic dysfunction of pelvic region: Secondary | ICD-10-CM | POA: Diagnosis not present

## 2019-03-23 DIAGNOSIS — M9902 Segmental and somatic dysfunction of thoracic region: Secondary | ICD-10-CM | POA: Diagnosis not present

## 2019-03-23 DIAGNOSIS — M5414 Radiculopathy, thoracic region: Secondary | ICD-10-CM | POA: Diagnosis not present

## 2019-03-23 DIAGNOSIS — M5417 Radiculopathy, lumbosacral region: Secondary | ICD-10-CM | POA: Diagnosis not present

## 2019-03-23 DIAGNOSIS — M5136 Other intervertebral disc degeneration, lumbar region: Secondary | ICD-10-CM | POA: Diagnosis not present

## 2019-03-30 ENCOUNTER — Other Ambulatory Visit: Payer: Self-pay

## 2019-03-30 ENCOUNTER — Ambulatory Visit (INDEPENDENT_AMBULATORY_CARE_PROVIDER_SITE_OTHER): Payer: Medicare Other

## 2019-03-30 DIAGNOSIS — I6523 Occlusion and stenosis of bilateral carotid arteries: Secondary | ICD-10-CM

## 2019-04-03 ENCOUNTER — Other Ambulatory Visit: Payer: Self-pay | Admitting: Cardiology

## 2019-04-03 DIAGNOSIS — I6523 Occlusion and stenosis of bilateral carotid arteries: Secondary | ICD-10-CM

## 2019-04-27 ENCOUNTER — Ambulatory Visit: Payer: Medicare Other

## 2019-04-27 ENCOUNTER — Other Ambulatory Visit: Payer: Self-pay

## 2019-04-27 DIAGNOSIS — I350 Nonrheumatic aortic (valve) stenosis: Secondary | ICD-10-CM | POA: Diagnosis not present

## 2019-05-05 ENCOUNTER — Ambulatory Visit: Payer: Medicare Other | Admitting: Cardiology

## 2019-05-16 ENCOUNTER — Other Ambulatory Visit: Payer: Self-pay

## 2019-05-16 ENCOUNTER — Ambulatory Visit: Payer: Medicare Other | Admitting: Cardiology

## 2019-05-16 ENCOUNTER — Encounter: Payer: Self-pay | Admitting: Cardiology

## 2019-05-16 VITALS — BP 117/61 | HR 74 | Temp 97.2°F | Ht 70.0 in | Wt 219.0 lb

## 2019-05-16 DIAGNOSIS — I350 Nonrheumatic aortic (valve) stenosis: Secondary | ICD-10-CM | POA: Diagnosis not present

## 2019-05-16 DIAGNOSIS — I10 Essential (primary) hypertension: Secondary | ICD-10-CM

## 2019-05-16 DIAGNOSIS — I6523 Occlusion and stenosis of bilateral carotid arteries: Secondary | ICD-10-CM | POA: Diagnosis not present

## 2019-05-16 NOTE — Progress Notes (Signed)
Patient is here for follow up visit.  Subjective:   @Patient  ID: [name] [name], male    DOB: 09/17/1931, 86 y.o.   MRN: [id number]   Chief Complaint  Patient presents with  . Aortic Stenosis    HPI  86 year old Caucasian male with controlled hypertension, severe aortic stenosis, bilateral asymptomatic carotid stenosis.  Recent echocardiogram showed progression of aortic stenosis, now severe with dimensionless index of 0.18. .  Carotid stenosis remains mild to moderate.  Patient continues to be asymptomatic.  He walks a mile every day, perform 60 push-ups regularly, without any symptoms of chest pain, shortness of breath.  Prior to the pandemic, he used to work out regularly at [employer] which has since stopped.  [facility]  He also denies any orthopnea, PND, leg edema, presyncope or syncope symptoms.  Current Outpatient Medications on File Prior to Visit  Medication Sig Dispense Refill  . amLODipine (NORVASC) 5 MG tablet Take 5 mg by mouth daily.    Marland Kitchen aspirin EC 81 MG tablet Take 1 tablet (81 mg total) by mouth daily. 30 tablet 2  . B-COMPLEX-C PO Take by mouth daily.    . Cholecalciferol (VITAMIN D PO) Take 1 tablet by mouth daily.    . clobetasol (TEMOVATE) 0.05 % external solution prn    . L-Lysine 500 MG CAPS Take 500 mg by mouth daily.    . Misc Natural Products (PROSTATE CONTROL PO) Take by mouth daily.    . Multiple Vitamin (MULTIVITAMIN WITH MINERALS) TABS tablet Take 1 tablet by mouth daily.    . Glucosamine-Chondroitin-MSM (TRIPLE FLEX PO) Take by mouth daily.    . rosuvastatin (CRESTOR) 40 MG tablet Take 40 mg by mouth daily.     No current facility-administered medications on file prior to visit.    Cardiovascular studies:  EKG 05/16/2019: Sinus rhythm 76 bpm. Left atrial enlargement.  Probable old inferior infarct.  Echocardiogram 04/27/2019:  Normal LV systolic function with visual EF 60-65%. Left ventricle cavity    is normal in size. Severe left ventricular hypertrophy. Normal global wall  motion. No obvious regional wall motion abnormalities. Indeterminate  diastolic filling pattern. Calculated EF 64%.  Left atrial cavity is severely dilated 61ml/m2.  Trileaflet aortic valve with no regurgitation. Thickened and calcified  aortic valve leaflets with reduced cusp separation. Peak velocity 3.12m/s,  mean gradient 40.3 mmHg, AVA (VTI) calculated area of 0.8 cm^2,  Dimensional index 0.18, findings suggestive of severe aortic stenosis.  Mild pulmonic regurgitation.  Mild mitral regurgitation. Mild calcification of the mitral valve annulus.  Moderate tricuspid regurgitation. No evidence of pulmonary hypertension.  Prior study dated 09/2018: LVEF 60%. Moderate concentric hypertrophy of  the left ventricle. Grade I diastolic dysfunction. Moderately dilated left  atrium. Moderate to severe aortic valve stenosis. AVA (VTI) measures 0.93  cm^2. AV Mean Grad measures 28.4 mmHg. AV Pk Vel measures 3.52 m/s. Mild  to moderate mitral regurgitation. Mild tricuspid regurgitation. No  evidence of pulmonary hypertension.  Carotid artery duplex 03/30/2019:  Stenosis in the right internal carotid artery (16-49%).  Stenosis in the left internal carotid artery (50-69%).  Antegrade right vertebral artery flow. Antegrade left vertebral artery  flow.  Follow up in six months is appropriate if clinically indicated.  Compared to 09/27/2018, no significant change.   Treadmill exercise stress 03/27/2017: Indication: Aortic Stenosis, SoB The patient exercised on Bruce protocol for 6:44 min. Patient achieved 8.17 METS and reached  HR 148 bpm, which is 108% of maximum age-predicted HR. Stress test terminated due to Fatigue. BP Response to Exercise: Resting hypertension- appropriate response. Resting 152/94 and peak 204/106 mm Hg. Resting EKG demonstrates Normal sinus rhythm. ST Changes: With peak exercise there was no ST-T  changes of ischemia. Arrhythmias: ventricular premature beats-isolated. Chest Pain: none. Exercise capacity was normal for age. Continue primary/secondary prevention.   Review of Systems  Cardiovascular: Negative for chest pain, dyspnea on exertion, leg swelling, palpitations and syncope.       Objective:    Vitals:   05/16/19 1004  BP: 117/61  Pulse: 74  Temp: (!) 97.2 F (36.2 C)  SpO2: 97%     Physical Exam  Constitutional: No distress.  HENT:  Head: Normocephalic and atraumatic.  Neck: No JVD present.  Cardiovascular: Normal rate, regular rhythm and intact distal pulses.  Murmur (III/VI crescendo decrescendo mur,mur RUSB) heard. Pulmonary/Chest: Effort normal and breath sounds normal. He has no wheezes. He has no rales.  Musculoskeletal:        General: No edema.  Neurological: No cranial nerve deficit.  Psychiatric: He has a normal mood and affect.  Nursing note and vitals reviewed.       Assessment & Recommendations:   86 year old Caucasian male with controlled hypertension, moderate to severe aortic stenosis, bilateral asymptomatic mild-mod carotid stenosis.  Aortic stenosis: Severe aortic stenosis, although remains asymptomatic.  Natural history of severe aortic stenosis discussed with the patient.   Will check echocardiogram with strain imaging and NT-proBNP for recertification and asymptomatic patient with severe aortic stenosis.  I will see him in 3 months for follow-up and repeat echocardiogram in 6 months.  I have stopped his amlodipine at this time.  Carotid artery stenosis: Bilateral, asymptomatic.  Continue aspirin, statin.    Hypertension: Well-controlled.   [name], MD [facility] [place] Pager: [phone] Office: [phone] If no answer Cell [phone]

## 2019-05-19 DIAGNOSIS — M5136 Other intervertebral disc degeneration, lumbar region: Secondary | ICD-10-CM | POA: Diagnosis not present

## 2019-05-19 DIAGNOSIS — M9902 Segmental and somatic dysfunction of thoracic region: Secondary | ICD-10-CM | POA: Diagnosis not present

## 2019-05-19 DIAGNOSIS — M9903 Segmental and somatic dysfunction of lumbar region: Secondary | ICD-10-CM | POA: Diagnosis not present

## 2019-05-19 DIAGNOSIS — M9905 Segmental and somatic dysfunction of pelvic region: Secondary | ICD-10-CM | POA: Diagnosis not present

## 2019-05-19 DIAGNOSIS — M5414 Radiculopathy, thoracic region: Secondary | ICD-10-CM | POA: Diagnosis not present

## 2019-05-19 DIAGNOSIS — M5417 Radiculopathy, lumbosacral region: Secondary | ICD-10-CM | POA: Diagnosis not present

## 2019-05-24 DIAGNOSIS — L570 Actinic keratosis: Secondary | ICD-10-CM | POA: Diagnosis not present

## 2019-05-24 DIAGNOSIS — Z1283 Encounter for screening for malignant neoplasm of skin: Secondary | ICD-10-CM | POA: Diagnosis not present

## 2019-05-24 DIAGNOSIS — D225 Melanocytic nevi of trunk: Secondary | ICD-10-CM | POA: Diagnosis not present

## 2019-05-24 DIAGNOSIS — X32XXXD Exposure to sunlight, subsequent encounter: Secondary | ICD-10-CM | POA: Diagnosis not present

## 2019-05-24 DIAGNOSIS — Z85820 Personal history of malignant melanoma of skin: Secondary | ICD-10-CM | POA: Diagnosis not present

## 2019-05-24 DIAGNOSIS — Z08 Encounter for follow-up examination after completed treatment for malignant neoplasm: Secondary | ICD-10-CM | POA: Diagnosis not present

## 2019-05-25 ENCOUNTER — Other Ambulatory Visit: Payer: Self-pay

## 2019-05-25 ENCOUNTER — Ambulatory Visit: Payer: Medicare Other

## 2019-06-02 ENCOUNTER — Telehealth: Payer: Self-pay

## 2019-06-02 NOTE — Telephone Encounter (Signed)
Pt called to inform us that he came on his his appt and told him that it was canceled and would like to know when will he have the test done

## 2019-06-10 ENCOUNTER — Telehealth: Payer: Self-pay

## 2019-06-10 DIAGNOSIS — I350 Nonrheumatic aortic (valve) stenosis: Secondary | ICD-10-CM

## 2019-06-10 MED ORDER — FUROSEMIDE 20 MG PO TABS: 20.0000 mg | ORAL_TABLET | Freq: Every day | ORAL | 3 refills | Status: DC

## 2019-06-10 NOTE — Telephone Encounter (Signed)
Pt called to inform us that he has been having SOB for 2 weeks. Pt has had headaches. Pt denies dizziness, nausea, or chest pain please advise.

## 2019-06-10 NOTE — Telephone Encounter (Signed)
Spoke with the patient.  Patient has known severe aortic stenosis, which thus far has been asymptomatic. However, he is now experiencing exertional dyspnea for last 2 weeks, without any chest pain. BP is 120s/70s.   Recommend starting lasix 20 mg daily. Recommend coronary angiogram, RHC, and referral to structural valve clinic for consideration for TAVR. Risks/benefits discussed with the patient. Will need and COVID test next week.   [name], MD [facility]. [place] Pager: [phone] Office: [phone]

## 2019-06-15 ENCOUNTER — Other Ambulatory Visit (HOSPITAL_COMMUNITY): Payer: Self-pay | Admitting: Cardiology

## 2019-06-15 DIAGNOSIS — I350 Nonrheumatic aortic (valve) stenosis: Secondary | ICD-10-CM | POA: Diagnosis not present

## 2019-06-15 DIAGNOSIS — R609 Edema, unspecified: Secondary | ICD-10-CM | POA: Diagnosis not present

## 2019-06-16 LAB — PRO B NATRIURETIC PEPTIDE: NT-Pro BNP: 258 pg/mL (ref 0–486)

## 2019-06-17 ENCOUNTER — Encounter: Payer: Self-pay | Admitting: Cardiovascular Disease

## 2019-06-17 ENCOUNTER — Other Ambulatory Visit: Payer: Self-pay

## 2019-06-17 ENCOUNTER — Ambulatory Visit (INDEPENDENT_AMBULATORY_CARE_PROVIDER_SITE_OTHER): Payer: Medicare Other | Admitting: Cardiovascular Disease

## 2019-06-17 ENCOUNTER — Other Ambulatory Visit (HOSPITAL_COMMUNITY)
Admission: RE | Admit: 2019-06-17 | Discharge: 2019-06-17 | Disposition: A | Discharge status: Home / Self Care | Payer: Medicare Other | Source: Ambulatory Visit | Attending: Cardiology | Admitting: Cardiology

## 2019-06-17 VITALS — BP 126/78 | HR 77 | Ht 70.0 in | Wt 216.8 lb

## 2019-06-17 DIAGNOSIS — I350 Nonrheumatic aortic (valve) stenosis: Secondary | ICD-10-CM | POA: Diagnosis not present

## 2019-06-17 DIAGNOSIS — Z01812 Encounter for preprocedural laboratory examination: Secondary | ICD-10-CM | POA: Insufficient documentation

## 2019-06-17 DIAGNOSIS — I6523 Occlusion and stenosis of bilateral carotid arteries: Secondary | ICD-10-CM

## 2019-06-17 DIAGNOSIS — Z20822 Contact with and (suspected) exposure to covid-19: Secondary | ICD-10-CM | POA: Insufficient documentation

## 2019-06-17 LAB — SARS CORONAVIRUS 2 (TAT 6-24 HRS): SARS Coronavirus 2: NEGATIVE

## 2019-06-17 NOTE — Progress Notes (Signed)
[facility] Consult Note  Chief Complaint  Patient presents with  . Follow-up    Severe aortic stenosis    History of Present Illness: 86 yo male with history of carotid artery disease, HTN, hyperlipidemia and severe aortic stenosis who is referred to the valve clinic today by Dr. Virgina Jock for further discussion regarding her aortic stenosis and possible TAVR. He has been followed for moderate aortic stenosis. Most recent echo 04/27/19, personally reviewed by me, with LVEF=60-65%. The aortic valve leaflets are thickened and calcified with limited leaflet excursion. Mean gradient 40 mmHg, AVA 0.8 cm2, dimensionless index 0.18. Cardiac cath planned next week per Dr. Virgina Jock. He is known to have carotid artery disease. Most recent carotid artery dopplers 03/30/19 with moderate bilateral carotid artery disease.   He tells me today that he has had recent dyspnea on exertion. This has occurred while playing golf and is new for him. No chest pain, dizziness, near syncope or syncope. No LE edema. He goes to the dentist and has no active dental issues. He lives in [place] with his wife. Retired from [employer].   Primary Care Physician: Tisovec, Fransico Him, MD Primary Cardiology: Virgina Jock Referring Cardiology: Patwardhan   Past Medical History:  Diagnosis Date  . Acid reflux   . Aortic stenosis   . Bilateral carotid bruits   . Carotid artery occlusion   . Exertional dyspnea   . Hypertension     Past Surgical History:  Procedure Laterality Date  . HERNIA REPAIR N/A   . MENISCUS REPAIR N/A   . TONSILLECTOMY      Current Outpatient Medications  Medication Sig Dispense Refill  . aspirin EC 81 MG tablet Take 1 tablet (81 mg total) by mouth daily. 30 tablet 2  . b complex vitamins tablet Take 1 tablet by mouth daily.    . Biotin 10000 MCG TABS Take 10,000 mcg by mouth daily.    . cetirizine (ZYRTEC) 10 MG tablet Take 10 mg by mouth daily.    . cholecalciferol  (VITAMIN D3) 25 MCG (1000 UNIT) tablet Take 3,000 Units by mouth daily.    . clobetasol ointment (TEMOVATE) AB-123456789 % Apply 1 application topically daily as needed (precancerous spots).    . furosemide (LASIX) 20 MG tablet Take 1 tablet (20 mg total) by mouth daily. 30 tablet 3  . ibuprofen (ADVIL) 200 MG tablet Take 600 mg by mouth every 6 (six) hours as needed for headache or moderate pain.    Marland Kitchen L-Lysine 500 MG CAPS Take 500 mg by mouth See admin instructions. Take 500 mg daily, may take 1000-1500 mg instead when experiencing a cold sore    . Misc Natural Products (OSTEO BI-FLEX ADV TRIPLE ST) TABS Take 1 tablet by mouth daily.    . Misc Natural Products (PROSTATE CONTROL PO) Take 1 tablet by mouth daily.     . Multiple Vitamin (MULTIVITAMIN WITH MINERALS) TABS tablet Take 1 tablet by mouth daily.    . rosuvastatin (CRESTOR) 40 MG tablet Take 40 mg by mouth daily.    . vitamin E 180 MG (400 UNITS) capsule Take 400 Units by mouth daily.     No current facility-administered medications for this visit.    No Known Allergies  Social History   Socioeconomic History  . Marital status: Married    Spouse name: Not on file  . Number of children: 2  . Years of education: Not on file  . Highest education level: Not on file  Occupational History  . Occupation: Retired-[employer]  Tobacco Use  . Smoking status: Former Smoker    Packs/day: 1.00    Types: Cigarettes    Quit date: 1990    Years since quitting: 31.3  . Smokeless tobacco: Never Used  Substance and Sexual Activity  . Alcohol use: Yes    Comment: Drinks wine daily   . Drug use: No  . Sexual activity: Not on file  Other Topics Concern  . Not on file  Social History Narrative  . Not on file   Social Determinants of Health   Financial Resource Strain:   . Difficulty of Paying Living Expenses:   Food Insecurity:   . Worried About Charity fundraiser in the Last Year:   . Arboriculturist in the Last Year:     Transportation Needs:   . Film/video editor (Medical):   Marland Kitchen Lack of Transportation (Non-Medical):   Physical Activity:   . Days of Exercise per Week:   . Minutes of Exercise per Session:   Stress:   . Feeling of Stress :   Social Connections:   . Frequency of Communication with Friends and Family:   . Frequency of Social Gatherings with Friends and Family:   . Attends Religious Services:   . Active Member of Clubs or Organizations:   . Attends Archivist Meetings:   Marland Kitchen Marital Status:   Intimate Partner Violence:   . Fear of Current or Ex-Partner:   . Emotionally Abused:   Marland Kitchen Physically Abused:   . Sexually Abused:     Family History  Problem Relation Age of Onset  . Heart failure Mother   . Parkinson's disease Father   . Heart attack Brother     Review of Systems:  As stated in the HPI and otherwise negative.   BP 126/78   Pulse 77   Ht 5\' 10"  (1.778 m)   Wt 216 lb 12.8 oz (98.3 kg)   SpO2 96%   BMI 31.11 kg/m   Physical Examination: General: Well developed, well nourished, NAD  HEENT: OP clear, mucus membranes moist  SKIN: warm, dry. No rashes. Neuro: No focal deficits  Musculoskeletal: Muscle strength 5/5 all ext  Psychiatric: Mood and affect normal  Neck: No JVD, no carotid bruits, no thyromegaly, no lymphadenopathy.  Lungs:Clear bilaterally, no wheezes, rhonci, crackles Cardiovascular: Regular rate and rhythm. Loud, harsh, late peaking systolic murmur.  Abdomen:Soft. Bowel sounds present. Non-tender.  Extremities: No lower extremity edema. Pulses are 2 + in the bilateral DP/PT.  EKG:  EKG is not ordered today. The ekg ordered today demonstrates   Echo 04/27/19:  Normal LV systolic function with visual EF 60-65%. Left ventricle cavity  is normal in size. Severe left ventricular hypertrophy. Normal global wall  motion. No obvious regional wall motion abnormalities. Indeterminate  diastolic filling pattern. Calculated EF 64%.  Left atrial  cavity is severely dilated 85ml/m2.  Trileaflet aortic valve with no regurgitation. Thickened and calcified  aortic valve leaflets with reduced cusp separation. Peak velocity 3.31m/s,  mean gradient 40.3 mmHg, AVA (VTI) calculated area of 0.8 cm^2,  Dimensional index 0.18, findings suggestive of severe aortic stenosis.  Mild pulmonic regurgitation.  Mild mitral regurgitation. Mild calcification of the mitral valve annulus.  Moderate tricuspid regurgitation. No evidence of pulmonary hypertension.  Prior study dated 09/2018: LVEF 60%. Moderate concentric hypertrophy of  the left ventricle. Grade I diastolic dysfunction. Moderately dilated left  atrium. Moderate to severe aortic valve  stenosis. AVA (VTI) measures 0.93  cm^2. AV Mean Grad measures 28.4 mmHg. AV Pk Vel measures 3.52 m/s. Mild  to moderate mitral regurgitation. Mild tricuspid regurgitation. No  evidence of pulmonary hypertension.  Recent Labs: No results found for requested labs within last 8760 hours.   Lipid Panel No results found for: CHOL, TRIG, HDL, CHOLHDL, VLDL, LDLCALC, LDLDIRECT   Wt Readings from Last 3 Encounters:  06/17/19 216 lb 12.8 oz (98.3 kg)  05/16/19 219 lb (99.3 kg)  10/25/18 213 lb 9.6 oz (96.9 kg)     Other studies Reviewed: Additional studies/ records that were reviewed today include: echo images, office notes Review of the above records demonstrates: severe AS   Assessment and Plan:   1. Severe Aortic Valve Stenosis: He has severe, stage D aortic valve stenosis. I have personally reviewed the echo images. The aortic valve is thickened, calcified with limited leaflet mobility. I think he would benefit from AVR. Given advanced age, he is not a good candidate for conventional AVR by surgical approach. I think he may be a good candidate for TAVR.   STS Risk Score: Risk of Mortality: 2.105% Renal Failure: 3.346% Permanent Stroke: 1.852% Prolonged Ventilation: 7.936% DSW  Infection: 0.116% Reoperation: 4.157% Morbidity or Mortality: 13.916% Short Length of Stay: 24.829% Long Length of Stay: 6.969%  I have reviewed the natural history of aortic stenosis with the patient and their family members  who are present today. We have discussed the limitations of medical therapy and the poor prognosis associated with symptomatic aortic stenosis. We have reviewed potential treatment options, including palliative medical therapy, conventional surgical aortic valve replacement, and transcatheter aortic valve replacement. We discussed treatment options in the context of the patient's specific comorbid medical conditions.   He would like to proceed with planning for TAVR. Cardiac cath arranged for next week on 06/21/19 with Dr. Virgina Jock. Risks and benefits of the valve procedure are reviewed with the patient. After the cath, he will have a cardiac CT, CTA of the chest/abdomen and pelvis, PT assessment and will then be referred to see one of the CT surgeons on our TAVR team. His scans are for 06/28/19 and his visit with Dr. Cyndia Bent is planned for 06/29/19. Potential TAVR on 07/05/19.      Current medicines are reviewed at length with the patient today.  The patient does not have concerns regarding medicines.  The following changes have been made:  no change  Labs/ tests ordered today include:  No orders of the defined types were placed in this encounter.  Disposition:   FU with the valve team.    Signed, Lauree Chandler, MD 06/17/2019 4:49 PM    [address], [place], [place]  [address] Phone: [phone]; Fax: [phone]

## 2019-06-17 NOTE — Patient Instructions (Signed)
Medication Instructions:  No changes *If you need a refill on your cardiac medications before your next appointment, please call your pharmacy*   Lab Work: none If you have labs (blood work) drawn today and your tests are completely normal, you will receive your results only by: Marland Kitchen MyChart Message (if you have MyChart) OR . A paper copy in the mail If you have any lab test that is abnormal or we need to change your treatment, we will call you to review the results.  Other Instructions We are planning for CT scans on 06/28/19. You have an appointment scheduled with Dr. Cyndia Bent on 06/29/19--see below.

## 2019-06-20 ENCOUNTER — Other Ambulatory Visit: Payer: Self-pay

## 2019-06-20 DIAGNOSIS — I350 Nonrheumatic aortic (valve) stenosis: Secondary | ICD-10-CM

## 2019-06-20 NOTE — H&P (Signed)
[name] [name] is an 86 y.o. male.   Chief Complaint: Severe aortic stenosis HPI:    86 year old Caucasian male with controlled hypertension, severe aortic stenosis, bilateral asymptomatic carotid stenosis.  Patient is now symptomatic with exertional dyspnea.  Past Medical History:  Diagnosis Date  . Acid reflux   . Aortic stenosis   . Bilateral carotid bruits   . Carotid artery occlusion   . Exertional dyspnea   . Hypertension     Past Surgical History:  Procedure Laterality Date  . HERNIA REPAIR N/A   . MENISCUS REPAIR N/A   . TONSILLECTOMY      Family History  Problem Relation Age of Onset  . Heart failure Mother   . Parkinson's disease Father   . Heart attack Brother    Social History:  reports that he quit smoking about 31 years ago. His smoking use included cigarettes. He smoked 1.00 pack per day. He has never used smokeless tobacco. He reports current alcohol use. He reports that he does not use drugs.  Allergies: No Known Allergies  Review of Systems  Constitution: Negative for decreased appetite, malaise/fatigue, weight gain and weight loss.  HENT: Negative for congestion.   Eyes: Negative for visual disturbance.  Cardiovascular: Positive for dyspnea on exertion. Negative for chest pain, leg swelling, palpitations and syncope.  Respiratory: Negative for cough.   Endocrine: Negative for cold intolerance.  Hematologic/Lymphatic: Does not bruise/bleed easily.  Skin: Negative for itching and rash.  Musculoskeletal: Negative for myalgias.  Gastrointestinal: Negative for abdominal pain, nausea and vomiting.  Genitourinary: Negative for dysuria.  Neurological: Negative for dizziness and weakness.  Psychiatric/Behavioral: The patient is not nervous/anxious.   All other systems reviewed and are negative.    There were no vitals taken for this visit. There is no height or weight on file to calculate BMI.  Physical Exam  Constitutional: He is oriented to person,  place, and time. He appears well-developed and well-nourished. No distress.  HENT:  Head: Normocephalic and atraumatic.  Eyes: Pupils are equal, round, and reactive to light. Conjunctivae are normal.  Neck: No JVD present.  Cardiovascular: Normal rate, regular rhythm and intact distal pulses.  Murmur heard.  Harsh midsystolic murmur is present with a grade of 3/6 at the upper right sternal border radiating to the neck. Pulmonary/Chest: Effort normal and breath sounds normal. He has no wheezes. He has no rales.  Abdominal: Soft. Bowel sounds are normal. There is no rebound.  Musculoskeletal:        General: No edema.  Lymphadenopathy:    He has no cervical adenopathy.  Neurological: He is alert and oriented to person, place, and time. No cranial nerve deficit.  Skin: Skin is warm and dry.  Psychiatric: He has a normal mood and affect.  Nursing note and vitals reviewed.    Labs:   Lab Results  Component Value Date   WBC 4.8 02/03/2017   HGB 13.5 02/03/2017   HCT 39.9 02/03/2017   MCV 94.5 02/03/2017   PLT 124 (L) 02/03/2017   No results for input(s): NA, K, CL, CO2, BUN, CREATININE, CALCIUM, PROT, BILITOT, ALKPHOS, ALT, AST, GLUCOSE in the last 168 hours.  Invalid input(s): LABALBU  No medications prior to admission.     No current facility-administered medications for this encounter.  Current Outpatient Medications:  .  b complex vitamins tablet, Take 1 tablet by mouth daily., Disp: , Rfl:  .  Biotin 10000 MCG TABS, Take 10,000 mcg by mouth daily., Disp: ,  Rfl:  .  cetirizine (ZYRTEC) 10 MG tablet, Take 10 mg by mouth daily., Disp: , Rfl:  .  cholecalciferol (VITAMIN D3) 25 MCG (1000 UNIT) tablet, Take 3,000 Units by mouth daily., Disp: , Rfl:  .  clobetasol ointment (TEMOVATE) AB-123456789 %, Apply 1 application topically daily as needed (precancerous spots)., Disp: , Rfl:  .  furosemide (LASIX) 20 MG tablet, Take 1 tablet (20 mg total) by mouth daily., Disp: 30 tablet, Rfl:  3 .  ibuprofen (ADVIL) 200 MG tablet, Take 600 mg by mouth every 6 (six) hours as needed for headache or moderate pain., Disp: , Rfl:  .  L-Lysine 500 MG CAPS, Take 500 mg by mouth See admin instructions. Take 500 mg daily, may take 1000-1500 mg instead when experiencing a cold sore, Disp: , Rfl:  .  Misc Natural Products (OSTEO BI-FLEX ADV TRIPLE ST) TABS, Take 1 tablet by mouth daily., Disp: , Rfl:  .  Misc Natural Products (PROSTATE CONTROL PO), Take 1 tablet by mouth daily. , Disp: , Rfl:  .  Multiple Vitamin (MULTIVITAMIN WITH MINERALS) TABS tablet, Take 1 tablet by mouth daily., Disp: , Rfl:  .  rosuvastatin (CRESTOR) 40 MG tablet, Take 40 mg by mouth daily., Disp: , Rfl:  .  vitamin E 180 MG (400 UNITS) capsule, Take 400 Units by mouth daily., Disp: , Rfl:  .  aspirin EC 81 MG tablet, Take 1 tablet (81 mg total) by mouth daily., Disp: 30 tablet, Rfl: 2   Today's Vitals   06/21/19 1100 06/21/19 1120 06/21/19 1322 06/21/19 1323  BP: (!) 148/70     Pulse: 88     Resp:      Temp:      TempSrc:      SpO2: 100%   97%  Weight:      Height:      PainSc:  0-No pain 0-No pain    There is no height or weight on file to calculate BMI.   CARDIAC STUDIES:  EKG 05/16/2019: Sinus rhythm 76 bpm. Left atrial enlargement.  Probable old inferior infarct.  Echocardiogram 04/27/2019:  Normal LV systolic function with visual EF 60-65%. Left ventricle cavity  is normal in size. Severe left ventricular hypertrophy. Normal global wall  motion. No obvious regional wall motion abnormalities. Indeterminate  diastolic filling pattern. Calculated EF 64%.  Left atrial cavity is severely dilated 50ml/m2.  Trileaflet aortic valve with no regurgitation. Thickened and calcified  aortic valve leaflets with reduced cusp separation. Peak velocity 3.23m/s,  mean gradient 40.3 mmHg, AVA (VTI) calculated area of 0.8 cm^2,  Dimensional index 0.18, findings suggestive of severe aortic stenosis.  Mild pulmonic  regurgitation.  Mild mitral regurgitation. Mild calcification of the mitral valve annulus.  Moderate tricuspid regurgitation. No evidence of pulmonary hypertension.  Prior study dated 09/2018: LVEF 60%. Moderate concentric hypertrophy of  the left ventricle. Grade I diastolic dysfunction. Moderately dilated left  atrium. Moderate to severe aortic valve stenosis. AVA (VTI) measures 0.93  cm^2. AV Mean Grad measures 28.4 mmHg. AV Pk Vel measures 3.52 m/s. Mild  to moderate mitral regurgitation. Mild tricuspid regurgitation. No  evidence of pulmonary hypertension.  Carotid artery duplex 03/30/2019:  Stenosis in the right internal carotid artery (16-49%).  Stenosis in the left internal carotid artery (50-69%).  Antegrade right vertebral artery flow. Antegrade left vertebral artery  flow.  Follow up in six months is appropriate if clinically indicated.  Compared to 09/27/2018, no significant change.   Treadmill exercise stress 03/27/2017:  Indication: Aortic Stenosis, SoB The patient exercised on Bruce protocol for 6:44 min. Patient achieved 8.17 METS and reached HR 148 bpm, which is 108% of maximum age-predicted HR. Stress test terminated due to Fatigue. BP Response to Exercise: Resting hypertension- appropriate response. Resting 152/94 and peak 204/106 mm Hg. Resting EKG demonstrates Normal sinus rhythm. ST Changes: With peak exercise there was no ST-T changes of ischemia. Arrhythmias: ventricular premature beats-isolated. Chest Pain: none. Exercise capacity was normal for age. Continue primary/secondary prevention.  Assessment/Plan  86 year old Caucasian male with controlled hypertension, severe aortic stenosis, bilateral asymptomatic carotid stenosis.  Plan for coronary angiography, right heart cath for pre TAVR workup  [name], MD 06/20/2019, 9:55 PM [facility] Pager: [phone] Office: [phone] If no answer: [phone]

## 2019-06-21 ENCOUNTER — Encounter (HOSPITAL_COMMUNITY)
Admission: RE | Disposition: A | Discharge status: Home / Self Care | Payer: Self-pay | Source: Home / Self Care | Attending: Cardiology

## 2019-06-21 ENCOUNTER — Other Ambulatory Visit: Payer: Self-pay

## 2019-06-21 ENCOUNTER — Ambulatory Visit (HOSPITAL_COMMUNITY)
Admission: RE | Admit: 2019-06-21 | Discharge: 2019-06-21 | Disposition: A | Discharge status: Home / Self Care | Payer: Medicare Other | Attending: Cardiology | Admitting: Cardiology

## 2019-06-21 DIAGNOSIS — I6523 Occlusion and stenosis of bilateral carotid arteries: Secondary | ICD-10-CM | POA: Diagnosis not present

## 2019-06-21 DIAGNOSIS — I350 Nonrheumatic aortic (valve) stenosis: Secondary | ICD-10-CM | POA: Insufficient documentation

## 2019-06-21 DIAGNOSIS — I10 Essential (primary) hypertension: Secondary | ICD-10-CM | POA: Insufficient documentation

## 2019-06-21 DIAGNOSIS — R0609 Other forms of dyspnea: Secondary | ICD-10-CM | POA: Diagnosis not present

## 2019-06-21 DIAGNOSIS — Z79899 Other long term (current) drug therapy: Secondary | ICD-10-CM | POA: Insufficient documentation

## 2019-06-21 DIAGNOSIS — Z7982 Long term (current) use of aspirin: Secondary | ICD-10-CM | POA: Diagnosis not present

## 2019-06-21 DIAGNOSIS — K219 Gastro-esophageal reflux disease without esophagitis: Secondary | ICD-10-CM | POA: Insufficient documentation

## 2019-06-21 DIAGNOSIS — Z87891 Personal history of nicotine dependence: Secondary | ICD-10-CM | POA: Diagnosis not present

## 2019-06-21 HISTORY — PX: RIGHT/LEFT HEART CATH AND CORONARY ANGIOGRAPHY: CATH118266

## 2019-06-21 LAB — POCT I-STAT 7, (LYTES, BLD GAS, ICA,H+H)
Acid-Base Excess: 1 mmol/L (ref 0.0–2.0)
Bicarbonate: 26.7 mmol/L (ref 20.0–28.0)
Calcium, Ion: 1.24 mmol/L (ref 1.15–1.40)
HCT: 38 % — ABNORMAL LOW (ref 39.0–52.0)
Hemoglobin: 12.9 g/dL — ABNORMAL LOW (ref 13.0–17.0)
O2 Saturation: 95 %
Potassium: 3.8 mmol/L (ref 3.5–5.1)
Sodium: 144 mmol/L (ref 135–145)
TCO2: 28 mmol/L (ref 22–32)
pCO2 arterial: 45.5 mmHg (ref 32.0–48.0)
pH, Arterial: 7.376 (ref 7.350–7.450)
pO2, Arterial: 81 mmHg — ABNORMAL LOW (ref 83.0–108.0)

## 2019-06-21 LAB — CBC
HCT: 45.9 % (ref 39.0–52.0)
Hemoglobin: 15.6 g/dL (ref 13.0–17.0)
MCH: 31.2 pg (ref 26.0–34.0)
MCHC: 34 g/dL (ref 30.0–36.0)
MCV: 91.8 fL (ref 80.0–100.0)
Platelets: 179 10*3/uL (ref 150–400)
RBC: 5 MIL/uL (ref 4.22–5.81)
RDW: 12.1 % (ref 11.5–15.5)
WBC: 5.6 10*3/uL (ref 4.0–10.5)
nRBC: 0 % (ref 0.0–0.2)

## 2019-06-21 LAB — POCT I-STAT EG7
Acid-Base Excess: 2 mmol/L (ref 0.0–2.0)
Bicarbonate: 27.8 mmol/L (ref 20.0–28.0)
Calcium, Ion: 1.25 mmol/L (ref 1.15–1.40)
HCT: 38 % — ABNORMAL LOW (ref 39.0–52.0)
Hemoglobin: 12.9 g/dL — ABNORMAL LOW (ref 13.0–17.0)
O2 Saturation: 73 %
Potassium: 3.9 mmol/L (ref 3.5–5.1)
Sodium: 144 mmol/L (ref 135–145)
TCO2: 29 mmol/L (ref 22–32)
pCO2, Ven: 46 mmHg (ref 44.0–60.0)
pH, Ven: 7.389 (ref 7.250–7.430)
pO2, Ven: 39 mmHg (ref 32.0–45.0)

## 2019-06-21 LAB — BASIC METABOLIC PANEL
Anion gap: 10 (ref 5–15)
BUN: 14 mg/dL (ref 8–23)
CO2: 26 mmol/L (ref 22–32)
Calcium: 9.8 mg/dL (ref 8.9–10.3)
Chloride: 106 mmol/L (ref 98–111)
Creatinine, Ser: 1.07 mg/dL (ref 0.61–1.24)
GFR calc Af Amer: >60 mL/min (ref >60)
GFR calc non Af Amer: >60 mL/min (ref >60)
Glucose, Bld: 112 mg/dL — ABNORMAL HIGH (ref 70–99)
Potassium: 4.2 mmol/L (ref 3.5–5.1)
Sodium: 142 mmol/L (ref 135–145)

## 2019-06-21 SURGERY — RIGHT/LEFT HEART CATH AND CORONARY ANGIOGRAPHY
Anesthesia: LOCAL

## 2019-06-21 MED ORDER — ASPIRIN 81 MG PO CHEW: 81.0000 mg | CHEWABLE_TABLET | ORAL | Status: DC

## 2019-06-21 MED ORDER — SODIUM CHLORIDE 0.9% FLUSH: 3.0000 mL | INTRAVENOUS | Status: DC | PRN

## 2019-06-21 MED ORDER — VERAPAMIL HCL 2.5 MG/ML IV SOLN
INTRAVENOUS | Status: DC | PRN
  Administered 2019-06-21 (×2): 10 mL via INTRA_ARTERIAL

## 2019-06-21 MED ORDER — MIDAZOLAM HCL 2 MG/2ML IJ SOLN
INTRAMUSCULAR | Status: DC | PRN
  Administered 2019-06-21: 1 mg via INTRAVENOUS

## 2019-06-21 MED ORDER — SODIUM CHLORIDE 0.9 % IV SOLN: 250.0000 mL | INTRAVENOUS | Status: DC | PRN

## 2019-06-21 MED ORDER — LIDOCAINE HCL (PF) 1 % IJ SOLN
INTRAMUSCULAR | Status: DC | PRN
  Administered 2019-06-21: 2 mL via INTRADERMAL

## 2019-06-21 MED ORDER — ACETAMINOPHEN 325 MG PO TABS: 650.0000 mg | ORAL_TABLET | ORAL | Status: DC | PRN

## 2019-06-21 MED ORDER — SODIUM CHLORIDE 0.9 % WEIGHT BASED INFUSION
3.0000 mL/kg/h | INTRAVENOUS | Status: AC
  Administered 2019-06-21: 11:00:00 3 mL/kg/h via INTRAVENOUS

## 2019-06-21 MED ORDER — SODIUM CHLORIDE 0.9 % WEIGHT BASED INFUSION: 1.0000 mL/kg/h | INTRAVENOUS | Status: DC

## 2019-06-21 MED ORDER — FENTANYL CITRATE (PF) 100 MCG/2ML IJ SOLN
INTRAMUSCULAR | Status: DC | PRN
  Administered 2019-06-21: 25 ug via INTRAVENOUS

## 2019-06-21 MED ORDER — LABETALOL HCL 5 MG/ML IV SOLN: 10.0000 mg | INTRAVENOUS | Status: DC | PRN

## 2019-06-21 MED ORDER — MIDAZOLAM HCL 2 MG/2ML IJ SOLN
INTRAMUSCULAR | Status: AC
  Filled 2019-06-21: qty 2

## 2019-06-21 MED ORDER — ONDANSETRON HCL 4 MG/2ML IJ SOLN: 4.0000 mg | Freq: Four times a day (QID) | INTRAMUSCULAR | Status: DC | PRN

## 2019-06-21 MED ORDER — FENTANYL CITRATE (PF) 100 MCG/2ML IJ SOLN
INTRAMUSCULAR | Status: AC
  Filled 2019-06-21: qty 2

## 2019-06-21 MED ORDER — HYDRALAZINE HCL 20 MG/ML IJ SOLN: 10.0000 mg | INTRAMUSCULAR | Status: DC | PRN

## 2019-06-21 MED ORDER — SODIUM CHLORIDE 0.9 % IV SOLN: INTRAVENOUS | Status: AC

## 2019-06-21 MED ORDER — HEPARIN SODIUM (PORCINE) 1000 UNIT/ML IJ SOLN
INTRAMUSCULAR | Status: AC
  Filled 2019-06-21: qty 1

## 2019-06-21 MED ORDER — IODIXANOL 320 MG/ML IV SOLN
INTRAVENOUS | Status: DC | PRN
  Administered 2019-06-21: 14:00:00 45 mL via INTRA_ARTERIAL

## 2019-06-21 MED ORDER — SODIUM CHLORIDE 0.9% FLUSH: 3.0000 mL | Freq: Two times a day (BID) | INTRAVENOUS | Status: DC

## 2019-06-21 MED ORDER — HEPARIN (PORCINE) IN NACL 1000-0.9 UT/500ML-% IV SOLN
INTRAVENOUS | Status: AC
  Filled 2019-06-21: qty 1000

## 2019-06-21 MED ORDER — VERAPAMIL HCL 2.5 MG/ML IV SOLN
INTRAVENOUS | Status: AC
  Filled 2019-06-21: qty 2

## 2019-06-21 MED ORDER — HEPARIN (PORCINE) IN NACL 1000-0.9 UT/500ML-% IV SOLN
INTRAVENOUS | Status: DC | PRN
  Administered 2019-06-21 (×2): 500 mL

## 2019-06-21 MED ORDER — HEPARIN SODIUM (PORCINE) 1000 UNIT/ML IJ SOLN
INTRAMUSCULAR | Status: DC | PRN
  Administered 2019-06-21: 5000 [IU] via INTRAVENOUS

## 2019-06-21 MED ORDER — LIDOCAINE HCL (PF) 1 % IJ SOLN
INTRAMUSCULAR | Status: AC
  Filled 2019-06-21: qty 30

## 2019-06-21 SURGICAL SUPPLY — 12 items
CATH 5FR JL3.5 JR4 ANG PIG MP (CATHETERS) ×1 IMPLANT
CATH BALLN WEDGE 5F 110CM (CATHETERS) ×1 IMPLANT
CATH INFINITI 5FR JL5 (CATHETERS) ×1 IMPLANT
GLIDESHEATH SLEND A-KIT 6F 22G (SHEATH) ×1 IMPLANT
GUIDEWIRE .025 260CM (WIRE) ×1 IMPLANT
GUIDEWIRE INQWIRE 1.5J.035X260 (WIRE) IMPLANT
INQWIRE 1.5J .035X260CM (WIRE) ×2
KIT HEART LEFT (KITS) ×2 IMPLANT
PACK CARDIAC CATHETERIZATION (CUSTOM PROCEDURE TRAY) ×2 IMPLANT
SHEATH GLIDE SLENDER 4/5FR (SHEATH) ×1 IMPLANT
TRANSDUCER W/STOPCOCK (MISCELLANEOUS) ×2 IMPLANT
TUBING CIL FLEX 10 FLL-RA (TUBING) ×2 IMPLANT

## 2019-06-21 NOTE — Interval H&P Note (Signed)
History and Physical Interval Note:  06/21/2019 1:25 PM  [name] [name]  has presented today for surgery, with the diagnosis of aortic stenosis.  The various methods of treatment have been discussed with the patient and family. After consideration of risks, benefits and other options for treatment, the patient has consented to  Procedure(s): RIGHT/LEFT HEART CATH AND CORONARY ANGIOGRAPHY (N/A) as a surgical intervention.  The patient's history has been reviewed, patient examined, no change in status, stable for surgery.  I have reviewed the patient's chart and labs.  Questions were answered to the patient's satisfaction.    2012 Appropriate Use Criteria for Diagnostic Catheterization Home / Select Test of Interest Indication for RHC Valvular Disease Valvular Disease (Right and Left Heart Catheterization or Right Heart Catheterization Alone With or  Valvular Disease  (Right and Left Heart Catheterization or Right Heart Catheterization  Alone With or Without Left Ventriculography and Coronary Angiography) Link Here: PimpTSh[employer]fi Indication:  Preoperative assessment before valvular surgery A (7) Indication: 70; Score 7   Armondo Cech J Catrell Morrone

## 2019-06-21 NOTE — Progress Notes (Signed)
Discharge instructions reviewed with patient and family. Verbalized understanding. 

## 2019-06-21 NOTE — Discharge Instructions (Signed)
Radial Site Care  This sheet gives you information about how to care for yourself after your procedure. Your health care provider may also give you more specific instructions. If you have problems or questions, contact your health care provider. What can I expect after the procedure? After the procedure, it is common to have:  Bruising and tenderness at the catheter insertion area. Follow these instructions at home: Medicines  Take over-the-counter and prescription medicines only as told by your health care provider. Insertion site care  Follow instructions from your health care provider about how to take care of your insertion site. Make sure you: ? Wash your hands with soap and water before you change your bandage (dressing). If soap and water are not available, use hand sanitizer. ? Change your dressing as told by your health care provider. ? Leave stitches (sutures), skin glue, or adhesive strips in place. These skin closures may need to stay in place for 2 weeks or longer. If adhesive strip edges start to loosen and curl up, you may trim the loose edges. Do not remove adhesive strips completely unless your health care provider tells you to do that.  Check your insertion site every day for signs of infection. Check for: ? Redness, swelling, or pain. ? Fluid or blood. ? Pus or a bad smell. ? Warmth.  Do not take baths, swim, or use a hot tub until your health care provider approves.  You may shower 24-48 hours after the procedure, or as directed by your health care provider. ? Remove the dressing and gently wash the site with plain soap and water. ? Pat the area dry with a clean towel. ? Do not rub the site. That could cause bleeding.  Do not apply powder or lotion to the site. Activity   For 24 hours after the procedure, or as directed by your health care provider: ? Do not flex or bend the affected arm. ? Do not push or pull heavy objects with the affected arm. ? Do not  drive yourself home from the hospital or clinic. You may drive 24 hours after the procedure unless your health care provider tells you not to. ? Do not operate machinery or power tools.  Do not lift anything that is heavier than 10 lb (4.5 kg), or the limit that you are told, until your health care provider says that it is safe.  Ask your health care provider when it is okay to: ? Return to work or school. ? Resume usual physical activities or sports. ? Resume sexual activity. General instructions  If the catheter site starts to bleed, raise your arm and put firm pressure on the site. If the bleeding does not stop, get help right away. This is a medical emergency.  If you went home on the same day as your procedure, a responsible adult should be with you for the first 24 hours after you arrive home.  Keep all follow-up visits as told by your health care provider. This is important. Contact a health care provider if:  You have a fever.  You have redness, swelling, or yellow drainage around your insertion site. Get help right away if:  You have unusual pain at the radial site.  The catheter insertion area swells very fast.  The insertion area is bleeding, and the bleeding does not stop when you hold steady pressure on the area.  Your arm or hand becomes pale, cool, tingly, or numb. These symptoms may represent a serious problem   that is an emergency. Do not wait to see if the symptoms will go away. Get medical help right away. Call your local emergency services (911 in the [place] Do not drive yourself to the hospital. Summary  After the procedure, it is common to have bruising and tenderness at the site.  Follow instructions from your health care provider about how to take care of your radial site wound. Check the wound every day for signs of infection.  Do not lift anything that is heavier than 10 lb (4.5 kg), or the limit that you are told, until your health care provider says  that it is safe. This information is not intended to replace advice given to you by your health care provider. Make sure you discuss any questions you have with your health care provider. Document Revised: 03/25/2017 Document Reviewed: 03/25/2017 Elsevier Patient Education  2020 [employer]  

## 2019-06-27 ENCOUNTER — Encounter: Payer: Self-pay | Admitting: Physician Assistant

## 2019-06-28 ENCOUNTER — Ambulatory Visit (HOSPITAL_COMMUNITY)
Admission: RE | Admit: 2019-06-28 | Discharge: 2019-06-28 | Disposition: A | Discharge status: Home / Self Care | Payer: Medicare Other | Source: Ambulatory Visit | Attending: Cardiovascular Disease | Admitting: Cardiovascular Disease

## 2019-06-28 ENCOUNTER — Other Ambulatory Visit: Payer: Self-pay

## 2019-06-28 DIAGNOSIS — I350 Nonrheumatic aortic (valve) stenosis: Secondary | ICD-10-CM | POA: Diagnosis not present

## 2019-06-28 DIAGNOSIS — Z01818 Encounter for other preprocedural examination: Secondary | ICD-10-CM | POA: Diagnosis not present

## 2019-06-28 DIAGNOSIS — I77811 Abdominal aortic ectasia: Secondary | ICD-10-CM | POA: Diagnosis not present

## 2019-06-28 DIAGNOSIS — Z8679 Personal history of other diseases of the circulatory system: Secondary | ICD-10-CM | POA: Diagnosis not present

## 2019-06-28 MED ORDER — IOHEXOL 350 MG/ML SOLN
100.0000 mL | Freq: Once | INTRAVENOUS | Status: AC | PRN
  Administered 2019-06-28: 100 mL via INTRAVENOUS

## 2019-06-28 MED ORDER — METOPROLOL TARTRATE 5 MG/5ML IV SOLN
INTRAVENOUS | Status: AC
  Filled 2019-06-28: qty 10

## 2019-06-28 MED ORDER — METOPROLOL TARTRATE 5 MG/5ML IV SOLN
5.0000 mg | INTRAVENOUS | Status: DC | PRN
  Administered 2019-06-28: 5 mg via INTRAVENOUS

## 2019-06-28 NOTE — Progress Notes (Signed)
Spoke with Clarise Cruz RN Navigator regarding patients HR 104.  Patient is scheduled for TAVR and with administer IV Metoprolol to get patients HR down closer to 80.  Patient understands and will proceed.

## 2019-06-29 ENCOUNTER — Ambulatory Visit: Payer: Medicare Other | Attending: Cardiovascular Disease | Admitting: Physical Therapy

## 2019-06-29 ENCOUNTER — Institutional Professional Consult (permissible substitution) (INDEPENDENT_AMBULATORY_CARE_PROVIDER_SITE_OTHER): Payer: Medicare Other | Admitting: Surgery

## 2019-06-29 ENCOUNTER — Encounter: Payer: Self-pay | Admitting: Surgery

## 2019-06-29 ENCOUNTER — Other Ambulatory Visit: Payer: Self-pay

## 2019-06-29 ENCOUNTER — Encounter: Payer: Self-pay | Admitting: Physical Therapy

## 2019-06-29 VITALS — BP 99/64 | HR 77 | Temp 97.9°F | Resp 18 | Ht 70.0 in | Wt 216.4 lb

## 2019-06-29 DIAGNOSIS — I350 Nonrheumatic aortic (valve) stenosis: Secondary | ICD-10-CM

## 2019-06-29 DIAGNOSIS — R262 Difficulty in walking, not elsewhere classified: Secondary | ICD-10-CM | POA: Diagnosis not present

## 2019-06-29 DIAGNOSIS — I6523 Occlusion and stenosis of bilateral carotid arteries: Secondary | ICD-10-CM

## 2019-06-29 NOTE — Therapy (Signed)
[place], [place], [address] Phone: [phone]   Fax:  [phone]  Physical Therapy Evaluation/Pre-TAVR Eval  Patient Details  Name: [name] [name] MRN: [id number] Date of Birth: 01-30-1932 Referring Provider (PT): Burnell Blanks, MD   Encounter Date: 06/29/2019  PT End of Session - 06/29/19 1254    Visit Number  1    PT Start Time  [id number]    PT Stop Time  1220    PT Time Calculation (min)  28 min    Activity Tolerance  Patient tolerated treatment well    Behavior During Therapy  [facility] for tasks assessed/performed       Past Medical History:  Diagnosis Date  . Acid reflux   . Carotid artery occlusion   . Hypertension   . Severe aortic stenosis     Past Surgical History:  Procedure Laterality Date  . HERNIA REPAIR N/A   . MENISCUS REPAIR N/A   . RIGHT/LEFT HEART CATH AND CORONARY ANGIOGRAPHY N/A 06/21/2019   Procedure: RIGHT/LEFT HEART CATH AND CORONARY ANGIOGRAPHY;  Surgeon: [name], MD;  Location: [facility];  Service: Cardiovascular;  Laterality: N/A;  . TONSILLECTOMY      There were no vitals filed for this visit.   Subjective Assessment - 06/29/19 1156    Subjective  Have been tracking about 2 years and began having SOB about 3 weeks ago. Had to stop 2-3 times when walking a mile but did not have to before. Was always very active with his wife- worked out at gym and plays golf.    Currently in Pain?  No/denies         [facility] PT Assessment - 06/29/19 0001      Assessment   Medical Diagnosis  severe aortic stenosis    Referring Provider (PT)  Burnell Blanks, MD    Onset Date/Surgical Date  --   SOB about 3 weeks ago   Hand Dominance  Right      Precautions   Precautions  None      Restrictions   Weight Bearing Restrictions  No      Balance Screen   Has the patient fallen in the past 6 months  No      [place] residence    Living Arrangements  Spouse/significant other    Additional Comments  stairs in home- master suite is on 2nd floor      Prior Function   Level of Independence  Independent      Cognition   Overall Cognitive Status  Within Functional Limits for tasks assessed      Observation/Other Assessments   Focus on Therapeutic Outcomes (FOTO)   n/a      Sensation   Additional Comments  WFL      ROM / Strength   AROM / PROM / Strength  Strength;AROM      AROM   Overall AROM Comments  WFL      Strength   Overall Strength Comments  gross 5/5 except Rt knee flesion 4/5    Strength Assessment Site  Hand    Right/Left hand  Right;Left    Right Hand Grip (lbs)  55    Left Hand Grip (lbs)  55       [facility] Pre-Surgical Assessment - 06/29/19 0001    5 Meter Walk Test- trial 1  3 sec    5 Meter Walk Test-  trial 2  3 sec.     5 Meter Walk Test- trial 3  3 sec.    5 meter walk test average  3 sec    4 Stage Balance Test Position  1    Sit To Stand Test- trial 1  8 sec.    6 Minute Walk- Baseline  yes    BP (mmHg)  111/66    HR (bpm)  81    02 Sat (%RA)  95 %    Modified Borg Scale for Dyspnea  0- Nothing at all    Perceived Rate of Exertion (Borg)  6-    6 Minute Walk Post Test  yes    BP (mmHg)  145/78    HR (bpm)  95    02 Sat (%RA)  96 %    Modified Borg Scale for Dyspnea  3- Moderate shortness of breath or breathing difficulty    Perceived Rate of Exertion (Borg)  9- very light    Aerobic Endurance Distance Walked  1465    Endurance additional comments  completed greater than standard age-related norm distance              Objective measurements completed on examination: See above findings.                           Plan - 06/29/19 1255    PT Frequency  One time visit    Consulted and Agree with Plan of Care  Patient       Clinical Impression Statement: Pt is a 86 yo M presenting to OP PT for evaluation prior to  possible TAVR surgery due to severe aortic stenosis. Pt reports onset of SOB about 3 weeks ago. Symptoms are  limiting endurance and exercise. Pt presents with WFL ROM and strength and denies musculoskeletal pain.   Pt ambulated a total of 1480 feet in 6 minute walk and reported 3/10 SOB on modified scale for dyspena and 9/20 RPE on Borg's perceived exertion and pain scale at the end of the walk. During the 6 minute walk test, patient's HR increased to 115 BPM and O2 saturation decreased to 93%. Based on the Short Physical Performance Battery, patient has a frailty rating of 9/12 with </= 5/12 considered frail.  Visit Diagnosis: Difficulty in walking, not elsewhere classified     Problem List Patient Active Problem List   Diagnosis Date Noted  . Severe aortic stenosis 04/08/2018  . Carotid stenosis, asymptomatic, bilateral 04/08/2018  . Essential hypertension 04/08/2018    Riad Wagley C. Thora Scherman PT, DPT 06/29/19 12:59 PM   [facility] [address] [place], [place], [address] Phone: [phone]   Fax:  [phone]  Name: [name] [name] MRN: [id number] Date of Birth: 02-16-1932

## 2019-06-29 NOTE — Progress Notes (Signed)
Patient ID: OLUWATOSIN CARROLL, male   DOB: [birth year], 86 y.o.   MRN: [id number]  [facility] CONSULTATION REPORT  Referring Provider is Patwardhan, Reynold Bowen, MD Primary Cardiologist is No primary care provider on file. PCP is Tisovec, Fransico Him, MD  Chief Complaint  Patient presents with  . Aortic Stenosis    new patient consultation, TAVR, review all studies    HPI:  The patient is an 86 year old gentleman with history of hypertension, hyperlipidemia, and aortic stenosis that has been followed for the past few years by Dr. Virgina Jock. An echocardiogram in July 2020 showed moderate to severe aortic stenosis with a mean gradient of 28.4 mmHg and a valve area of 0.93 cm. His most recent echocardiogram on 04-27-2019 showed an increase in the mean gradient to 40.3 mmHg with a valve area of 0.8 cm and dimensionless index of 0.18. Left ventricular systolic function was normal.  The patient has remained very active over the years doing ballroom dancing, playing golf, and going to the gym until the COVID-19 pandemic. Over the past several weeks he said that he has noted dyspnea on exertion and fatigue. This occurred while playing golf and he had never had it before. He had no chest pain, dizziness, or syncope. He has had no peripheral edema and no orthopnea. Later the symptoms to Dr. Virgina Jock and underwent cardiac catheterization and work-up for possible TAVR. Catheterization on 06-21-2019 showed a focal 40% stenosis in the mid LAD that was nonobstructive. There was no other significant coronary disease.  He is here today with his wife.   Past Medical History:  Diagnosis Date  . Acid reflux   . Carotid artery occlusion   . Hypertension   . Severe aortic stenosis     Past Surgical History:  Procedure Laterality Date  . HERNIA REPAIR N/A   . MENISCUS REPAIR N/A   . RIGHT/LEFT HEART CATH AND CORONARY ANGIOGRAPHY N/A  06/21/2019   Procedure: RIGHT/LEFT HEART CATH AND CORONARY ANGIOGRAPHY;  Surgeon: [name], MD;  Location: [facility];  Service: Cardiovascular;  Laterality: N/A;  . TONSILLECTOMY      Family History  Problem Relation Age of Onset  . Heart failure Mother   . Parkinson's disease Father   . Heart attack Brother     Social History   Socioeconomic History  . Marital status: Married    Spouse name: Not on file  . Number of children: 2  . Years of education: Not on file  . Highest education level: Not on file  Occupational History  . Occupation: Retired-[employer]  Tobacco Use  . Smoking status: Former Smoker    Packs/day: 1.00    Types: Cigarettes    Quit date: 1990    Years since quitting: 31.3  . Smokeless tobacco: Never Used  Substance and Sexual Activity  . Alcohol use: Yes    Comment: Drinks wine daily   . Drug use: No  . Sexual activity: Not on file  Other Topics Concern  . Not on file  Social History Narrative  . Not on file   Social Determinants of Health   Financial Resource Strain:   . Difficulty of Paying Living Expenses:   Food Insecurity:   . Worried About Charity fundraiser in the Last Year:   . Arboriculturist in the Last Year:   Transportation Needs:   . Film/video editor (Medical):   Marland Kitchen  Lack of Transportation (Non-Medical):   Physical Activity:   . Days of Exercise per Week:   . Minutes of Exercise per Session:   Stress:   . Feeling of Stress :   Social Connections:   . Frequency of Communication with Friends and Family:   . Frequency of Social Gatherings with Friends and Family:   . Attends Religious Services:   . Active Member of Clubs or Organizations:   . Attends Archivist Meetings:   Marland Kitchen Marital Status:   Intimate Partner Violence:   . Fear of Current or Ex-Partner:   . Emotionally Abused:   Marland Kitchen Physically Abused:   . Sexually Abused:     Current Outpatient Medications  Medication Sig  Dispense Refill  . aspirin EC 81 MG tablet Take 1 tablet (81 mg total) by mouth daily. 30 tablet 2  . b complex vitamins tablet Take 1 tablet by mouth daily.    . Biotin 10000 MCG TABS Take 10,000 mcg by mouth daily.    . cetirizine (ZYRTEC) 10 MG tablet Take 10 mg by mouth daily.    . cholecalciferol (VITAMIN D3) 25 MCG (1000 UNIT) tablet Take 3,000 Units by mouth daily.    . clobetasol ointment (TEMOVATE) [id number] % Apply 1 application topically daily as needed (precancerous spots).    . furosemide (LASIX) 20 MG tablet Take 1 tablet (20 mg total) by mouth daily. 30 tablet 3  . ibuprofen (ADVIL) 200 MG tablet Take 600 mg by mouth every 6 (six) hours as needed for headache or moderate pain.    Marland Kitchen L-Lysine 500 MG CAPS Take 500 mg by mouth See admin instructions. Take 500 mg daily, may take 1000-1500 mg instead when experiencing a cold sore    . Misc Natural Products (OSTEO BI-FLEX ADV TRIPLE ST) TABS Take 1 tablet by mouth daily.    . Misc Natural Products (PROSTATE CONTROL PO) Take 1 tablet by mouth daily.     . Multiple Vitamin (MULTIVITAMIN WITH MINERALS) TABS tablet Take 1 tablet by mouth daily.    . rosuvastatin (CRESTOR) 40 MG tablet Take 40 mg by mouth daily.    . tadalafil (CIALIS) 20 MG tablet Take 40 mg by mouth daily as needed for erectile dysfunction.    . vitamin E 180 MG (400 UNITS) capsule Take 400 Units by mouth daily.     No current facility-administered medications for this visit.    No Known Allergies    Review of Systems:   General:  normal appetite, + decreased energy, no weight gain, no weight loss, no fever  Cardiac:  no chest pain with exertion, no chest pain at rest, +SOB with mild exertion, no resting SOB, no PND, no orthopnea, no palpitations, no arrhythmia, no atrial fibrillation, no LE edema, no dizzy spells, no syncope  Respiratory:  + exertional shortness of breath, no home oxygen, no productive cough, no dry cough, no bronchitis, no wheezing, no hemoptysis, no  asthma, no pain with inspiration or cough, no sleep apnea, no CPAP at night  GI:   no difficulty swallowing, no reflux, no frequent heartburn, no hiatal hernia, no abdominal pain, no constipation, no diarrhea, no hematochezia, no hematemesis, no melena  GU:   no dysuria,  no frequency, no urinary tract infection, no hematuria, no enlarged prostate, no kidney stones, no kidney disease  Vascular:  no pain suggestive of claudication, no pain in feet, no leg cramps, no varicose veins, no DVT, no non-healing foot ulcer  Neuro:  no stroke, no TIA's, no seizures, no headaches, no temporary blindness one eye,  no slurred speech, no peripheral neuropathy, no chronic pain, no instability of gait, no memory/cognitive dysfunction  Musculoskeletal: no arthritis, no joint swelling, no myalgias, no difficulty walking, normal mobility   Skin:   no rash, no itching, no skin infections, no pressure sores or ulcerations  Psych:   no anxiety, no depression, no nervousness, no unusual recent stress  Eyes:   no blurry vision, no floaters, no recent vision changes, + wears glasses or contacts  ENT:   no hearing loss, no loose or painful teeth, no dentures, last saw dentist Jan 2021  Hematologic:  no easy bruising, no abnormal bleeding, no clotting disorder, no frequent epistaxis  Endocrine:  no diabetes, does not check CBG's at home     Physical Exam:   BP 99/64 (BP Location: Right Arm, Patient Position: Sitting, Cuff Size: Normal)   Pulse 77   Temp 97.9 F (36.6 C)   Resp 18   Ht 5\' 10"  (1.778 m)   Wt 216 lb 6.4 oz (98.2 kg)   SpO2 94%   BMI 31.05 kg/m   General:  well-appearing  HEENT:  Unremarkable, NCAT, PERLA, EOMI  Neck:   no JVD, no bruits, no adenopathy   Chest:   clear to auscultation, symmetrical breath sounds, no wheezes, no rhonchi   CV:   RRR, grade lll/VI crescendo/decrescendo murmur heard best at RSB,  no diastolic murmur  Abdomen:  soft, non-tender  Extremities:  warm, well-perfused,  pulses palpable dp and pt, no LE edema  Rectal/GU  Deferred  Neuro:   Grossly non-focal and symmetrical throughout  Skin:   Clean and dry, no rashes, no breakdown   Diagnostic Tests:  Echocardiogram 04/27/2019:  Normal LV systolic function with visual EF 60-65%. Left ventricle cavity  is normal in size. Severe left ventricular hypertrophy. Normal global wall  motion. No obvious regional wall motion abnormalities. Indeterminate  diastolic filling pattern. Calculated EF 64%.  Left atrial cavity is severely dilated 36ml/m2.  Trileaflet aortic valve with no regurgitation. Thickened and calcified  aortic valve leaflets with reduced cusp separation. Peak velocity 3.53m/s,  mean gradient 40.3 mmHg, AVA (VTI) calculated area of 0.8 cm^2,  Dimensional index 0.18, findings suggestive of severe aortic stenosis.  Mild pulmonic regurgitation.  Mild mitral regurgitation. Mild calcification of the mitral valve annulus.  Moderate tricuspid regurgitation. No evidence of pulmonary hypertension.  Prior study dated 09/2018: LVEF 60%. Moderate concentric hypertrophy of  the left ventricle. Grade I diastolic dysfunction. Moderately dilated left  atrium. Moderate to severe aortic valve stenosis. AVA (VTI) measures 0.93  cm^2. AV Mean Grad measures 28.4 mmHg. AV Pk Vel measures 3.52 m/s. Mild  to moderate mitral regurgitation. Mild tricuspid regurgitation. No  evidence of pulmonary hypertension.   Physicians Panel Physicians Referring Physician Case Authorizing Physician  Patwardhan, Reynold Bowen, MD (Primary)       Procedures RIGHT/LEFT HEART CATH AND CORONARY ANGIOGRAPHY     Conclusion LM: Normal LAD: Medial calcification seen in prox LAD without significant stenosis  Mid LAD focal 40% stenosis  LCX: Large, dominant.  Medial calcification seen in prox LCX without significant stenosis  RCA: Small, nondominant. Normal.  Normal right heart cath              Procedural Details Technical  Details Procedures: 1. Right heart catheterization 2. Selective left and right coronary angiography 3. Conscious sedation monitoring 34 min  Indication: Severe aortic stenosis  History:  86 year old Caucasian male with controlled hypertension, severe aortic stenosis, bilateral asymptomatic carotid stenosis.  Diagnostic Angiography: Catheter/s advances over guidewire under fluoroscopy Left coronary artery: 5 Fr JL 5  Right coronary artery: 5 Fr JR 4   Pressures tracings obtained in right atrium, right ventricle, pulmonary artery, and pulmonary capillary wedge position.   Anticoagulation:  5000 units heparin  Hemostasis: TR band  Total contrast used: 45 cc   Total fluoro time: 5.1 min Air Kerma: 380 mGy  All wires and catheters removed out of the body at the end of the procedure Final angiogram showed no dissection/perforation         Estimated blood loss <50 mL.   During this procedure medications were administered to achieve and maintain moderate conscious sedation while the patient's heart rate, blood pressure, and oxygen saturation were continuously monitored and I was present face-to-face 100% of this time.     Medications (Filter: Administrations occurring from 06/21/19 1310 to 06/21/19 1424)  Heparin (Porcine) in NaCl 1000-0.9 UT/500ML-% SOLN (mL) Total volume: 1,000 mL  Date/Time   Rate/Dose/Volume Action  06/21/19 1332  500 mL Given  1332  500 mL Given  midazolam (VERSED) injection (mg) Total dose: 1 mg  Date/Time   Rate/Dose/Volume Action  06/21/19 1336  1 mg Given  fentaNYL (SUBLIMAZE) injection (mcg) Total dose: 25 mcg  Date/Time   Rate/Dose/Volume Action  06/21/19 1336  25 mcg Given  lidocaine (PF) (XYLOCAINE) 1 % injection (mL) Total volume: 2 mL  Date/Time   Rate/Dose/Volume Action  06/21/19 1342  2 mL Given  Radial Cocktail/Verapamil only (mL) Total volume: 20 mL  Date/Time   Rate/Dose/Volume Action  06/21/19 1343  10 mL Given    1359  10 mL Given  heparin sodium (porcine) injection (Units) Total dose: 5,000 Units  Date/Time   Rate/Dose/Volume Action  06/21/19 1355  5,000 Units Given  iodixanol (VISIPAQUE) 320 MG/ML injection (mL) Total volume: 45 mL  Date/Time   Rate/Dose/Volume Action  06/21/19 1419  45 mL Given     Sedation Time Sedation Time Physician-1: 34 minutes 14 seconds        Contrast Medication Name Total Dose  iodixanol (VISIPAQUE) 320 MG/ML injection 45 mL     Radiation/Fluoro Fluoro time: 5.1 (min)  DAP: 25492 (mGycm2)  Cumulative Air Kerma: 123XX123 (mGy)     Complications Complications documented before study signed (06/21/2019 2:24 PM)  RIGHT/LEFT HEART CATH AND CORONARY ANGIOGRAPHY  None Documented by [name], MD 06/21/2019 2:19 PM  Date Found: 06/21/2019  Time Range: Intraprocedure       Coronary Findings Diagnostic Dominance: Left  Left Main  Vessel is normal in caliber. Vessel is angiographically normal.   Left Anterior Descending  Vessel is normal in caliber. Medial wall calcium seen.  Mid LAD lesion 40% stenosed  Mid LAD lesion is 40% stenosed.   Left Circumflex  Vessel is large. Vessel is angiographically normal. Medial wall calcium seen.  Intervention No interventions have been documented.          Right Heart Right Heart Pressures RA: 3 mmHg RV: 27/2 mmHg PA: 28/11 mmHg, mean PAP 19 mmHg PCW: 7 mmHg  CO: 6 L/min CI: 2.8 L/min/m2                 Left Heart Left Ventricle Not performed     Coronary Diagrams Diagnostic Dominance: Left  &&&&&  Intervention      Implants  No implant documentation for this case.  Syngo Images Link to Procedure Log  Show images for CARDIAC CATHETERIZATION Procedure Log     Images on Long Term Storage   Show images for [place], [place]         Hemo Data   Most Recent Value  Fick Cardiac Output 6.07 L/min  Fick Cardiac Output Index 2.85 (L/min)/BSA  RA A Wave 7 mmHg  RA V  Wave 4 mmHg  RA Mean 3 mmHg  RV Systolic Pressure 27 mmHg  RV Diastolic Pressure 2 mmHg  RV EDP 6 mmHg  PA Systolic Pressure 28 mmHg  PA Diastolic Pressure 11 mmHg  PA Mean 19 mmHg  PW A Wave 12 mmHg  PW V Wave 8 mmHg  PW Mean 7 mmHg  AO Systolic Pressure 123XX123 mmHg  AO Diastolic Pressure 74 mmHg  AO Mean 97 mmHg  QP/QS 1  TPVR Index 6.66 HRUI  TSVR Index 34.01 HRUI  PVR SVR Ratio 0.13  TPVR/TSVR Ratio 0.2    ADDENDUM REPORT: 06/28/2019 21:30  CLINICAL DATA:  32M with severe aortic stenosis being evaluated for a TAVR procedure.  EXAM: Cardiac TAVR CT  TECHNIQUE: The patient was scanned on a [employer]. A 120 kV retrospective scan was triggered in the descending thoracic aorta at 111 HU's. Gantry rotation speed was 250 msecs and collimation was .6 mm. No beta blockade or nitro were given. The 3D data set was reconstructed in 5% intervals of the R-R cycle. Systolic and diastolic phases were analyzed on a dedicated work station using MPR, MIP and VRT modes. The patient received 80 cc of contrast.  FINDINGS: Aortic Root:  Aortic valve: Trileaflet  Aortic valve calcium score: 5364  Aortic annulus:  Diameter: 32 mm x 26 mm  Perimeter: 30mm  Area: 612 mm^2  Calcifications: No calcifications  Coronary height: Min Left - 80mm, Max Left - 51mm; Min Right - 78mm  Sinotubular height: Left cusp - 62mm; Right cusp - 54mm; Noncoronary cusp - 60mm  LVOT (as measured 3 mm below the annulus):  Diameter: 54mm x 75mm  Area: 610 cm^2  Calcifications: No calcifications  Aortic sinus width: Left cusp - 39mm; Right cusp - 52mm; Noncoronary cusp - 65mm  Sinotubular junction width: 19mm x 8mm  Optimum Fluoroscopic Angle for Delivery: LAO 14 CAU 8  Cardiac:  Right atrium: Moderate enlargement  Right ventricle: Moderate dilatation  Pulmonary arteries: Normal size  Pulmonary veins: Normal configuration  Left atrium: Mild  enlargement  Left ventricle: Normal size  Pericardium: Normal thickness  Coronary arteries: Calcium score 1356  IMPRESSION: 1. Trileaflet aortic valve, severely calcified (calcium score 5364)  2. Aortic annulus measures 70mm x 25mm with perimeter 73mm and area 612 mm^2. No annular or LVOT calcifications. Annulus measurements suitable for delivery of 13mm Edwards-Sapien 3 valve  3. Sufficient coronary to annulus distance, measuring 64mm to left main and 31mm to RCA  4. Optimum Fluoroscopic Angle for Delivery: LAO 14 CAU 8  5. Calcium score is 1356 (70th percentile)   Electronically Signed   By: Oswaldo Milian MD   On: 06/28/2019 21:30   CLINICAL DATA:  86 year old male with history of severe aortic stenosis. Preprocedural study prior to potential transcatheter aortic valve replacement (TAVR) procedure.  EXAM: CT ANGIOGRAPHY CHEST, ABDOMEN AND PELVIS  TECHNIQUE: Non-contrast CT of the chest was initially obtained.  Multidetector CT imaging through the chest, abdomen and pelvis was performed using the standard protocol during bolus administration of intravenous contrast. Multiplanar reconstructed images and MIPs were  obtained and reviewed to evaluate the vascular anatomy.  CONTRAST:  121mL OMNIPAQUE IOHEXOL 350 MG/ML SOLN  COMPARISON:  No priors.  FINDINGS: CTA CHEST FINDINGS  Cardiovascular: Heart size is mildly enlarged. There is no significant pericardial fluid, thickening or pericardial calcification. There is aortic atherosclerosis, as well as atherosclerosis of the great vessels of the mediastinum and the coronary arteries, including calcified atherosclerotic plaque in the left main, left anterior descending, left circumflex and right coronary arteries. Severe thickening calcification of the aortic valve. Mild calcifications of the mitral annulus.  Mediastinum/Lymph Nodes: No pathologically enlarged mediastinal or hilar lymph  nodes. Esophagus is unremarkable in appearance. No axillary lymphadenopathy.  Lungs/Pleura: No suspicious appearing pulmonary nodules or masses are noted. No acute consolidative airspace disease. No pleural effusions.  Musculoskeletal/Soft Tissues: No suspicious cystic or solid hepatic lesions. No intra or extrahepatic biliary ductal dilatation. Gallbladder is normal in appearance.  CTA ABDOMEN AND PELVIS FINDINGS  Hepatobiliary: No suspicious cystic or solid hepatic lesions. No intra or extrahepatic biliary ductal dilatation. Gallbladder is normal in appearance.  Pancreas: No pancreatic mass. No pancreatic ductal dilatation. No pancreatic or peripancreatic fluid collections or inflammatory changes.  Spleen: Unremarkable.  Adrenals/Urinary Tract: Low-attenuation lesions are noted in the kidneys bilaterally, compatible with simple cysts, largest of which is exophytic in the lower pole of the left kidney measuring 5.5 cm in diameter. Multiple subcentimeter low-attenuation lesions in both kidneys, too small to definitively characterize, but statistically likely to represent tiny cysts. Bilateral adrenal glands are normal in appearance. No hydroureteronephrosis. Urinary bladder is normal in appearance.  Stomach/Bowel: Normal appearance of the stomach. No pathologic dilatation of small bowel or colon. Normal appendix.  Vascular/Lymphatic: Aortic atherosclerosis, with fusiform ectasia of the infrarenal abdominal aorta which measures up to 2.8 x 2.7 cm in diameter. Vascular findings and measurements pertinent to potential TAVR procedure, as detailed below. No lymphadenopathy noted in the abdomen or pelvis.  Reproductive: Prostate gland and seminal vesicles are unremarkable in appearance.  Other: No significant volume of ascites.  No pneumoperitoneum.  Musculoskeletal: There are no aggressive appearing lytic or blastic lesions noted in the visualized portions of the  skeleton.  VASCULAR MEASUREMENTS PERTINENT TO TAVR:  AORTA:  Minimal Aortic Diameter-18 x 17 mm  Severity of Aortic Calcification-moderate to severe  RIGHT PELVIS:  Right Common Iliac Artery -  Minimal Diameter-12.0 x 10.5 mm  Tortuosity-mild  Calcification-moderate  Right External Iliac Artery -  Minimal Diameter-10.6 x 9.3 mm  Tortuosity-extreme  Calcification-mild  Right Common Femoral Artery -  Minimal Diameter-11.2 x 11.4 mm  Tortuosity-mild  Calcification-mild  LEFT PELVIS:  Left Common Iliac Artery -  Minimal Diameter-7.4 x 7.5 mm  Tortuosity-mild  Calcification-moderate  Left External Iliac Artery -  Minimal Diameter-10.3 x 8.4 mm  Tortuosity-extreme  Calcification-mild  Left Common Femoral Artery -  Minimal Diameter-11.4 x 11.1 mm  Tortuosity-mild  Calcification-none  Review of the MIP images confirms the above findings.  IMPRESSION: 1. Vascular findings and measurements pertinent to potential TAVR procedure, as detailed above. 2. Severe thickening calcification of the aortic valve, compatible with the reported clinical history of severe aortic stenosis. 3. Mild cardiomegaly. 4. Left main and 3 vessel coronary artery disease. 5. Additional incidental findings, as above.   Electronically Signed   By: Vinnie Langton M.D.   On: 06/28/2019 13:27   STS Risk Score: Risk of Mortality: 2.105% Renal Failure: 3.346% Permanent Stroke: 1.852% Prolonged Ventilation: 7.936% DSW Infection: 0.116% Reoperation: 4.157% Morbidity or Mortality: 13.916% Short Length of  Stay: 24.829% Long Length of Stay: 6.969%  Impression:  This 86 year old gentleman has stage D, severe, symptomatic aortic stenosis with New York Heart Association class II symptoms of exertional fatigue and shortness of breath consistent with chronic diastolic congestive heart failure. I have personally reviewed his 2D  echocardiogram, cardiac catheterization, and CTA studies. His echocardiogram shows a trileaflet aortic valve with thickened and calcified leaflets with restricted mobility. The mean gradient is 40 mmHg consistent with severe aortic stenosis. Left ventricular systolic function is normal. Cardiac catheterization shows no significant coronary disease. I agree that aortic valve replacement is indicated in this active gentleman for relief of his symptoms and to prevent progressive left ventricular deterioration. Given his age I think transcatheter aortic valve replacement would be the best option for him. His gated cardiac CTA shows anatomy suitable for transcatheter aortic valve replacement using a 29 mm SAPIEN 3 valve. His abdominal and pelvic CTA shows adequate pelvic vascular anatomy to allow transfemoral insertion.  The patient and his wife were counseled at length regarding treatment alternatives for management of severe symptomatic aortic stenosis. The risks and benefits of surgical intervention has been discussed in detail. Long-term prognosis with medical therapy was discussed. Alternative approaches such as conventional surgical aortic valve replacement, transcatheter aortic valve replacement, and palliative medical therapy were compared and contrasted at length. This discussion was placed in the context of the patient's own specific clinical presentation and past medical history. All of their questions have been addressed.   Following the decision to proceed with transcatheter aortic valve replacement, a discussion was held regarding what types of management strategies would be attempted intraoperatively in the event of life-threatening complications, including whether or not the patient would be considered a candidate for the use of cardiopulmonary bypass and/or conversion to open sternotomy for attempted surgical intervention. The patient is aware of the fact that transient use of cardiopulmonary bypass  may be necessary. He is 86 years old but in good physical condition and I think he would be a candidate for emergent sternotomy if needed to manage any intraoperative complications. The patient has been advised of a variety of complications that might develop including but not limited to risks of death, stroke, paravalvular leak, aortic dissection or other major vascular complications, aortic annulus rupture, device embolization, cardiac rupture or perforation, mitral regurgitation, acute myocardial infarction, arrhythmia, heart block or bradycardia requiring permanent pacemaker placement, congestive heart failure, respiratory failure, renal failure, pneumonia, infection, other late complications related to structural valve deterioration or migration, or other complications that might ultimately cause a temporary or permanent loss of functional independence or other long term morbidity. The patient provides full informed consent for the procedure as described and all questions were answered.    Plan:  He will be scheduled for transfemoral transcatheter aortic valve replacement using a SAPIEN 3 valve on Tuesday, 07/05/2019.  I spent 60 minutes performing this consultation and > 50% of this time was spent face to face counseling and coordinating the care of this patient's severe symptomatic aortic stenosis.    Gaye Pollack, MD 06/29/2019

## 2019-06-30 NOTE — Progress Notes (Signed)
[id number] - [place], [place] [address] [place] [address] Phone: [phone] Fax: [phone]      Your procedure is scheduled on Tuesday May 4  Report to [facility] Main Entrance "A" at 0530 A.M., and check in at the Admitting office.  Call this number if you have problems the morning of surgery:  [phone]  Call [phone] if you have any questions prior to your surgery date Monday-Friday 8am-4pm    Remember:  Do not eat or drink after midnight the night before your surgery     There are No medications that you need to take the Morning of Surgery  Continue Aspirin but do not take it the Morning of Surgery  As of today, STOP taking any Aleve, Naproxen, Ibuprofen, Motrin, Advil, Goody's, BC's, all herbal medications, fish oil, and all vitamins.                      Do not wear jewelry            Do not wear lotions, powders, colognes, or deodorant.            Men may shave face and neck.            Do not bring valuables to the hospital.            [facility] is not responsible for any belongings or valuables.  Do NOT Smoke (Tobacco/Vapping) or drink Alcohol 24 hours prior to your procedure If you use a CPAP at night, you may bring all equipment for your overnight stay.   Contacts, glasses, dentures or bridgework may not be worn into surgery.      For patients admitted to the hospital, discharge time will be determined by your treatment team.   Patients discharged the day of surgery will not be allowed to drive home, and someone needs to stay with them for 24 hours.    Special instructions:   [facility]- Preparing For Surgery  Before surgery, you can play an important role. Because skin is not sterile, your skin needs to be as free of germs as possible. You can reduce the number of germs on your skin by washing with CHG (chlorahexidine gluconate) Soap before surgery.  CHG is an  antiseptic cleaner which kills germs and bonds with the skin to continue killing germs even after washing.    Oral Hygiene is also important to reduce your risk of infection.  Remember - BRUSH YOUR TEETH THE MORNING OF SURGERY WITH YOUR REGULAR TOOTHPASTE  Please do not use if you have an allergy to CHG or antibacterial soaps. If your skin becomes reddened/irritated stop using the CHG.  Do not shave (including legs and underarms) for at least 48 hours prior to first CHG shower. It is OK to shave your face.  Please follow these instructions carefully.   1. Shower the NIGHT BEFORE SURGERY and the MORNING OF SURGERY with CHG Soap.   2. If you chose to wash your hair, wash your hair first as usual with your normal shampoo.  3. After you shampoo, rinse your hair and body thoroughly to remove the shampoo.  4. Use CHG as you would any other liquid soap. You can apply CHG directly to the skin and wash gently with a scrungie or a clean washcloth.   5. Apply the CHG Soap to your body ONLY FROM THE NECK DOWN.  Do not  use on open wounds or open sores. Avoid contact with your eyes, ears, mouth and genitals (private parts). Wash Face and genitals (private parts)  with your normal soap.   6. Wash thoroughly, paying special attention to the area where your surgery will be performed.  7. Thoroughly rinse your body with warm water from the neck down.  8. DO NOT shower/wash with your normal soap after using and rinsing off the CHG Soap.  9. Pat yourself dry with a CLEAN TOWEL.  10. Wear CLEAN PAJAMAS to bed the night before surgery, wear comfortable clothes the morning of surgery  11. Place CLEAN SHEETS on your bed the night of your first shower and DO NOT SLEEP WITH PETS.   Day of Surgery:   Do not apply any deodorants/lotions.  Please wear clean clothes to the hospital/surgery center.   Remember to brush your teeth WITH YOUR REGULAR TOOTHPASTE.   Please read over the following fact sheets that  you were given.

## 2019-07-01 ENCOUNTER — Other Ambulatory Visit (HOSPITAL_COMMUNITY): Payer: Medicare Other

## 2019-07-01 ENCOUNTER — Encounter (HOSPITAL_COMMUNITY)
Admission: RE | Admit: 2019-07-01 | Discharge: 2019-07-01 | Disposition: A | Discharge status: Home / Self Care | Payer: Medicare Other | Source: Ambulatory Visit | Attending: Cardiovascular Disease | Admitting: Cardiovascular Disease

## 2019-07-01 ENCOUNTER — Encounter (HOSPITAL_COMMUNITY): Payer: Self-pay

## 2019-07-01 ENCOUNTER — Other Ambulatory Visit: Payer: Self-pay

## 2019-07-01 ENCOUNTER — Ambulatory Visit (HOSPITAL_COMMUNITY)
Admission: RE | Admit: 2019-07-01 | Discharge: 2019-07-01 | Disposition: A | Discharge status: Home / Self Care | Payer: Medicare Other | Source: Ambulatory Visit | Attending: Cardiovascular Disease | Admitting: Cardiovascular Disease

## 2019-07-01 DIAGNOSIS — Z01818 Encounter for other preprocedural examination: Secondary | ICD-10-CM | POA: Diagnosis not present

## 2019-07-01 DIAGNOSIS — I359 Nonrheumatic aortic valve disorder, unspecified: Secondary | ICD-10-CM | POA: Diagnosis not present

## 2019-07-01 DIAGNOSIS — I10 Essential (primary) hypertension: Secondary | ICD-10-CM | POA: Diagnosis not present

## 2019-07-01 DIAGNOSIS — I350 Nonrheumatic aortic (valve) stenosis: Secondary | ICD-10-CM

## 2019-07-01 DIAGNOSIS — I700 Atherosclerosis of aorta: Secondary | ICD-10-CM | POA: Diagnosis not present

## 2019-07-01 DIAGNOSIS — J984 Other disorders of lung: Secondary | ICD-10-CM | POA: Insufficient documentation

## 2019-07-01 HISTORY — DX: Pure hypercholesterolemia, unspecified: E78.00

## 2019-07-01 HISTORY — DX: Unspecified osteoarthritis, unspecified site: M19.90

## 2019-07-01 HISTORY — DX: Pneumonia, unspecified organism: J18.9

## 2019-07-01 LAB — COMPREHENSIVE METABOLIC PANEL
ALT: 27 U/L (ref 0–44)
AST: 21 U/L (ref 15–41)
Albumin: 4.1 g/dL (ref 3.5–5.0)
Alkaline Phosphatase: 50 U/L (ref 38–126)
Anion gap: 13 (ref 5–15)
BUN: 14 mg/dL (ref 8–23)
CO2: 22 mmol/L (ref 22–32)
Calcium: 9.5 mg/dL (ref 8.9–10.3)
Chloride: 108 mmol/L (ref 98–111)
Creatinine, Ser: 0.98 mg/dL (ref 0.61–1.24)
GFR calc Af Amer: >60 mL/min (ref >60)
GFR calc non Af Amer: >60 mL/min (ref >60)
Glucose, Bld: 111 mg/dL — ABNORMAL HIGH (ref 70–99)
Potassium: 4.2 mmol/L (ref 3.5–5.1)
Sodium: 143 mmol/L (ref 135–145)
Total Bilirubin: 0.9 mg/dL (ref 0.3–1.2)
Total Protein: 6.7 g/dL (ref 6.5–8.1)

## 2019-07-01 LAB — BLOOD GAS, ARTERIAL
Acid-Base Excess: 1.3 mmol/L (ref 0.0–2.0)
Bicarbonate: 25.4 mmol/L (ref 20.0–28.0)
Drawn by: 421801
FIO2: 21
O2 Saturation: 98.2 %
Patient temperature: 37
pCO2 arterial: 40.2 mmHg (ref 32.0–48.0)
pH, Arterial: 7.417 (ref 7.350–7.450)
pO2, Arterial: 111 mmHg — ABNORMAL HIGH (ref 83.0–108.0)

## 2019-07-01 LAB — URINALYSIS, ROUTINE W REFLEX MICROSCOPIC
Bilirubin Urine: NEGATIVE
Glucose, UA: NEGATIVE mg/dL
Hgb urine dipstick: NEGATIVE
Ketones, ur: NEGATIVE mg/dL
Leukocytes,Ua: NEGATIVE
Nitrite: NEGATIVE
Protein, ur: NEGATIVE mg/dL
Specific Gravity, Urine: 1.017 (ref 1.005–1.030)
pH: 5 (ref 5.0–8.0)

## 2019-07-01 LAB — SURGICAL PCR SCREEN
MRSA, PCR: NEGATIVE
Staphylococcus aureus: NEGATIVE

## 2019-07-01 LAB — PROTIME-INR
INR: 1 (ref 0.8–1.2)
Prothrombin Time: 12.6 seconds (ref 11.4–15.2)

## 2019-07-01 LAB — CBC
HCT: 43.9 % (ref 39.0–52.0)
Hemoglobin: 14.6 g/dL (ref 13.0–17.0)
MCH: 30.8 pg (ref 26.0–34.0)
MCHC: 33.3 g/dL (ref 30.0–36.0)
MCV: 92.6 fL (ref 80.0–100.0)
Platelets: 149 10*3/uL — ABNORMAL LOW (ref 150–400)
RBC: 4.74 MIL/uL (ref 4.22–5.81)
RDW: 12.1 % (ref 11.5–15.5)
WBC: 5.3 10*3/uL (ref 4.0–10.5)
nRBC: 0 % (ref 0.0–0.2)

## 2019-07-01 LAB — BRAIN NATRIURETIC PEPTIDE: B Natriuretic Peptide: 73.4 pg/mL (ref 0.0–100.0)

## 2019-07-01 LAB — HEMOGLOBIN A1C
Hgb A1c MFr Bld: 5.5 % (ref 4.8–5.6)
Mean Plasma Glucose: 111.15 mg/dL

## 2019-07-01 LAB — TYPE AND SCREEN
ABO/RH(D): O POS
Antibody Screen: NEGATIVE

## 2019-07-01 LAB — APTT: aPTT: 38 seconds — ABNORMAL HIGH (ref 24–36)

## 2019-07-01 LAB — ABO/RH: ABO/RH(D): O POS

## 2019-07-01 NOTE — Progress Notes (Signed)
PCP - Dr. Domenick Gong Cardiologist - Dr. Virgina Jock  PPM/ICD - n/a Device Orders -  Rep Notified -   Chest x-ray - 07/01/2019 EKG - 06/13/2019 Stress Test - 2020, requested from Dr. Bonney Roussel office ECHO - 05/25/2019 Cardiac Cath - 06/21/2019  Sleep Study - patient denies CPAP -   Fasting Blood Sugar - n/a Checks Blood Sugar _____ times a day  Blood Thinner Instructions: n/a Aspirin Instructions: patient instructed to continue ASA, do not take DOS  ERAS Protcol - n/a PRE-SURGERY Ensure or G2-   COVID TEST- scheduled for Saturday 07/02/19   Anesthesia review: yes, cardiac history  Patient denies shortness of breath, fever, cough and chest pain at PAT appointment   All instructions explained to the patient, with a verbal understanding of the material. Patient agrees to go over the instructions while at home for a better understanding. Patient also instructed to self quarantine after being tested for COVID-19. The opportunity to ask questions was provided.

## 2019-07-02 ENCOUNTER — Other Ambulatory Visit (HOSPITAL_COMMUNITY)
Admission: RE | Admit: 2019-07-02 | Discharge: 2019-07-02 | Disposition: A | Discharge status: Home / Self Care | Payer: Medicare Other | Source: Ambulatory Visit | Attending: Cardiovascular Disease | Admitting: Cardiovascular Disease

## 2019-07-02 LAB — SARS CORONAVIRUS 2 (TAT 6-24 HRS): SARS Coronavirus 2: NEGATIVE

## 2019-07-04 MED ORDER — MAGNESIUM SULFATE 50 % IJ SOLN
40.0000 meq | INTRAMUSCULAR | Status: DC
  Filled 2019-07-04: qty 9.85

## 2019-07-04 MED ORDER — NOREPINEPHRINE 4 MG/250ML-% IV SOLN
0.0000 ug/min | INTRAVENOUS | Status: AC
  Administered 2019-07-05: 2 ug/min via INTRAVENOUS
  Filled 2019-07-04: qty 250

## 2019-07-04 MED ORDER — POTASSIUM CHLORIDE 2 MEQ/ML IV SOLN
80.0000 meq | INTRAVENOUS | Status: DC
  Filled 2019-07-04: qty 40

## 2019-07-04 MED ORDER — VANCOMYCIN HCL 1500 MG/300ML IV SOLN
1500.0000 mg | INTRAVENOUS | Status: AC
  Administered 2019-07-05: 1500 mg via INTRAVENOUS
  Filled 2019-07-04: qty 300

## 2019-07-04 MED ORDER — SODIUM CHLORIDE 0.9 % IV SOLN
1.5000 g | INTRAVENOUS | Status: AC
  Administered 2019-07-05: 1.5 g via INTRAVENOUS
  Filled 2019-07-04: qty 1.5

## 2019-07-04 MED ORDER — SODIUM CHLORIDE 0.9 % IV SOLN
INTRAVENOUS | Status: DC
  Filled 2019-07-04: qty 30

## 2019-07-04 MED ORDER — DEXMEDETOMIDINE HCL IN NACL 400 MCG/100ML IV SOLN
0.1000 ug/kg/h | INTRAVENOUS | Status: AC
  Administered 2019-07-05: 1 ug/kg/h via INTRAVENOUS
  Filled 2019-07-04: qty 100

## 2019-07-04 NOTE — Anesthesia Preprocedure Evaluation (Addendum)
Anesthesia Evaluation  Patient identified by MRN, date of birth, ID band Patient awake    Reviewed: Allergy & Precautions, NPO status , Patient's Chart, lab work & pertinent test results  History of Anesthesia Complications Negative for: history of anesthetic complications  Airway Mallampati: II  TM Distance: >3 FB Neck ROM: Full    Dental  (+) Dental Advisory Given   Pulmonary shortness of breath and with exertion, former smoker (quit 1990),  07/02/2019 SARS Coronavirus NEG   breath sounds clear to auscultation       Cardiovascular hypertension, Pt. on medications (-) angina(-) CAD (non-obstructive, minimal) + Valvular Problems/Murmurs (Severe AS) AS  Rhythm:Regular Rate:Normal  04/2019 ECHO: EF 60-65%, severe AS with peak velocity 3.8 m/s and mean gradient 40.3 mmHg, AVA 0.8 cm2, mild MR   Neuro/Psych negative neurological ROS     GI/Hepatic Neg liver ROS, GERD  Controlled,  Endo/Other  negative endocrine ROS  Renal/GU negative Renal ROS     Musculoskeletal   Abdominal   Peds  Hematology negative hematology ROS (+)   Anesthesia Other Findings   Reproductive/Obstetrics                            Anesthesia Physical Anesthesia Plan  ASA: III  Anesthesia Plan: MAC   Post-op Pain Management:    Induction:   PONV Risk Score and Plan: 1 and Treatment may vary due to age or medical condition  Airway Management Planned: Natural Airway and Simple Face Mask  Additional Equipment: Arterial line  Intra-op Plan:   Post-operative Plan:   Informed Consent: I have reviewed the patients History and Physical, chart, labs and discussed the procedure including the risks, benefits and alternatives for the proposed anesthesia with the patient or authorized representative who has indicated his/her understanding and acceptance.     Dental advisory given  Plan Discussed with: CRNA and  Surgeon  Anesthesia Plan Comments:        Anesthesia Quick Evaluation

## 2019-07-04 NOTE — H&P (Signed)
[address]       [place] [address]             [phone]      Cardiothoracic Surgery Admission History and Physical   Referring Provider is Patwardhan, Reynold Bowen, MD  Primary Cardiologist is No primary care provider on file.  PCP is Tisovec, Fransico Him, MD      Chief Complaint  Patient presents with  . Aortic Stenosis       HPI:  The patient is an 86 year old gentleman with history of hypertension, hyperlipidemia, and aortic stenosis that has been followed for the past few years by Dr. Virgina Jock. An echocardiogram in July 2020 showed moderate to severe aortic stenosis with a mean gradient of 28.4 mmHg and a valve area of 0.93 cm. His most recent echocardiogram on 04-27-2019 showed an increase in the mean gradient to 40.3 mmHg with a valve area of 0.8 cm and dimensionless index of 0.18. Left ventricular systolic function was normal.  The patient has remained very active over the years doing ballroom dancing, playing golf, and going to the gym until the COVID-19 pandemic. Over the past several weeks he said that he has noted dyspnea on exertion and fatigue. This occurred while playing golf and he had never had it before. He had no chest pain, dizziness, or syncope. He has had no peripheral edema and no orthopnea. Later the symptoms to Dr. Virgina Jock and underwent cardiac catheterization and work-up for possible TAVR. Catheterization on 06-21-2019 showed a focal 40% stenosis in the mid LAD that was nonobstructive. There was no other significant coronary disease.        Past Medical History:  Diagnosis Date  . Acid reflux   . Carotid artery occlusion   . Hypertension   . Severe aortic stenosis         Past Surgical History:  Procedure Laterality Date  . HERNIA REPAIR N/A   . MENISCUS REPAIR N/A   . RIGHT/LEFT HEART CATH AND CORONARY ANGIOGRAPHY N/A 06/21/2019   Procedure: RIGHT/LEFT HEART CATH AND CORONARY ANGIOGRAPHY; Surgeon: [name], MD;  Location: [facility]; Service: Cardiovascular; Laterality: N/A;  . TONSILLECTOMY          Family History  Problem Relation Age of Onset  . Heart failure Mother   . Parkinson's disease Father   . Heart attack Brother    Social History        Socioeconomic History  . Marital status: Married    Spouse name: Not on file  . Number of children: 2  . Years of education: Not on file  . Highest education level: Not on file  Occupational History  . Occupation: Retired-[employer]  Tobacco Use  . Smoking status: Former Smoker    Packs/day: 1.00    Types: Cigarettes    Quit date: 1990    Years since quitting: 31.3  . Smokeless tobacco: Never Used  Substance and Sexual Activity  . Alcohol use: Yes    Comment: Drinks wine daily   . Drug use: No  . Sexual activity: Not on file  Other Topics Concern  . Not on file  Social History Narrative  . Not on file   Social Determinants of Health      Financial Resource Strain:   . Difficulty of Paying Living Expenses:   Food Insecurity:   . Worried About Charity fundraiser in the Last Year:   . [employer] of  Food in the Last Year:   Transportation Needs:   . Film/video editor (Medical):   Marland Kitchen Lack of Transportation (Non-Medical):   Physical Activity:   . Days of Exercise per Week:   . Minutes of Exercise per Session:   Stress:   . Feeling of Stress :   Social Connections:   . Frequency of Communication with Friends and Family:   . Frequency of Social Gatherings with Friends and Family:   . Attends Religious Services:   . Active Member of Clubs or Organizations:   . Attends Archivist Meetings:   Marland Kitchen Marital Status:   Intimate Partner Violence:   . Fear of Current or Ex-Partner:   . Emotionally Abused:   Marland Kitchen Physically Abused:   . Sexually Abused:          Current Outpatient Medications  Medication Sig Dispense Refill  . aspirin EC 81 MG tablet Take 1 tablet (81 mg total) by mouth daily. 30 tablet 2    . b complex vitamins tablet Take 1 tablet by mouth daily.    . Biotin 10000 MCG TABS Take 10,000 mcg by mouth daily.    . cetirizine (ZYRTEC) 10 MG tablet Take 10 mg by mouth daily.    . cholecalciferol (VITAMIN D3) 25 MCG (1000 UNIT) tablet Take 3,000 Units by mouth daily.    . clobetasol ointment (TEMOVATE) [id number] % Apply 1 application topically daily as needed (precancerous spots).    . furosemide (LASIX) 20 MG tablet Take 1 tablet (20 mg total) by mouth daily. 30 tablet 3  . ibuprofen (ADVIL) 200 MG tablet Take 600 mg by mouth every 6 (six) hours as needed for headache or moderate pain.    Marland Kitchen L-Lysine 500 MG CAPS Take 500 mg by mouth See admin instructions. Take 500 mg daily, may take 1000-1500 mg instead when experiencing a cold sore    . Misc Natural Products (OSTEO BI-FLEX ADV TRIPLE ST) TABS Take 1 tablet by mouth daily.    . Misc Natural Products (PROSTATE CONTROL PO) Take 1 tablet by mouth daily.     . Multiple Vitamin (MULTIVITAMIN WITH MINERALS) TABS tablet Take 1 tablet by mouth daily.    . rosuvastatin (CRESTOR) 40 MG tablet Take 40 mg by mouth daily.    . tadalafil (CIALIS) 20 MG tablet Take 40 mg by mouth daily as needed for erectile dysfunction.    . vitamin E 180 MG (400 UNITS) capsule Take 400 Units by mouth daily.     No current facility-administered medications for this visit.   No Known Allergies   Review of Systems:   General: normal appetite, + decreased energy, no weight gain, no weight loss, no fever  Cardiac: no chest pain with exertion, no chest pain at rest, +SOB with mild exertion, no resting SOB, no PND, no orthopnea, no palpitations, no arrhythmia, no atrial fibrillation, no LE edema, no dizzy spells, no syncope  Respiratory: + exertional shortness of breath, no home oxygen, no productive cough, no dry cough, no bronchitis, no wheezing, no hemoptysis, no asthma, no pain with inspiration or cough, no sleep apnea, no CPAP at night  GI: no difficulty swallowing, no  reflux, no frequent heartburn, no hiatal hernia, no abdominal pain, no constipation, no diarrhea, no hematochezia, no hematemesis, no melena  GU: no dysuria, no frequency, no urinary tract infection, no hematuria, no enlarged prostate, no kidney stones, no kidney disease  Vascular: no pain suggestive of claudication, no pain in feet, no  leg cramps, no varicose veins, no DVT, no non-healing foot ulcer  Neuro: no stroke, no TIA's, no seizures, no headaches, no temporary blindness one eye, no slurred speech, no peripheral neuropathy, no chronic pain, no instability of gait, no memory/cognitive dysfunction  Musculoskeletal: no arthritis, no joint swelling, no myalgias, no difficulty walking, normal mobility  Skin: no rash, no itching, no skin infections, no pressure sores or ulcerations  Psych: no anxiety, no depression, no nervousness, no unusual recent stress  Eyes: no blurry vision, no floaters, no recent vision changes, + wears glasses or contacts  ENT: no hearing loss, no loose or painful teeth, no dentures, last saw dentist Jan 2021  Hematologic: no easy bruising, no abnormal bleeding, no clotting disorder, no frequent epistaxis  Endocrine: no diabetes, does not check CBG's at home    Physical Exam:   BP 99/64 (BP Location: Right Arm, Patient Position: Sitting, Cuff Size: Normal)  Pulse 77  Temp 97.9 F (36.6 C)  Resp 18  Ht 5\' 10"  (1.778 m)  Wt 216 lb 6.4 oz (98.2 kg)  SpO2 94%  BMI 31.05 kg/m  General: well-appearing  HEENT: Unremarkable, NCAT, PERLA, EOMI  Neck: no JVD, no bruits, no adenopathy  Chest: clear to auscultation, symmetrical breath sounds, no wheezes, no rhonchi  CV: RRR, grade lll/VI crescendo/decrescendo murmur heard best at RSB, no diastolic murmur  Abdomen: soft, non-tender  Extremities: warm, well-perfused, pulses palpable dp and pt, no LE edema  Rectal/GU Deferred  Neuro: Grossly non-focal and symmetrical throughout  Skin: Clean and dry, no rashes, no  breakdown    Diagnostic Tests:   Echocardiogram 04/27/2019:  Normal LV systolic function with visual EF 60-65%. Left ventricle cavity  is normal in size. Severe left ventricular hypertrophy. Normal global wall  motion. No obvious regional wall motion abnormalities. Indeterminate  diastolic filling pattern. Calculated EF 64%.  Left atrial cavity is severely dilated 62ml/m2.  Trileaflet aortic valve with no regurgitation. Thickened and calcified  aortic valve leaflets with reduced cusp separation. Peak velocity 3.43m/s,  mean gradient 40.3 mmHg, AVA (VTI) calculated area of 0.8 cm^2,  Dimensional index 0.18, findings suggestive of severe aortic stenosis.  Mild pulmonic regurgitation.  Mild mitral regurgitation. Mild calcification of the mitral valve annulus.  Moderate tricuspid regurgitation. No evidence of pulmonary hypertension.  Prior study dated 09/2018: LVEF 60%. Moderate concentric hypertrophy of  the left ventricle. Grade I diastolic dysfunction. Moderately dilated left  atrium. Moderate to severe aortic valve stenosis. AVA (VTI) measures 0.93  cm^2. AV Mean Grad measures 28.4 mmHg. AV Pk Vel measures 3.52 m/s. Mild  to moderate mitral regurgitation. Mild tricuspid regurgitation. No  evidence of pulmonary hypertension.     Panel Physicians Referring Physician Case Authorizing Physician  Patwardhan, Reynold Bowen, MD (Primary)       Procedures  RIGHT/LEFT HEART CATH AND CORONARY ANGIOGRAPHY     Conclusion  LM: Normal LAD: Medial calcification seen in prox LAD without significant stenosis  Mid LAD focal 40% stenosis  LCX: Large, dominant.  Medial calcification seen in prox LCX without significant stenosis  RCA: Small, nondominant. Normal.  Normal right heart cath              Procedural Details  Technical Details Procedures: 1. Right heart catheterization 2. Selective left and right coronary angiography 3. Conscious sedation monitoring 34 min  Indication: Severe  aortic stenosis  History: 86 year old Caucasian male with controlled hypertension, severe aortic stenosis, bilateral asymptomatic carotid stenosis.  Diagnostic Angiography: Catheter/s advances over guidewire  under fluoroscopy Left coronary artery: 5 Fr JL 5  Right coronary artery: 5 Fr JR 4   Pressures tracings obtained in right atrium, right ventricle, pulmonary artery, and pulmonary capillary wedge position.   Anticoagulation:  5000 units heparin  Hemostasis: TR band  Total contrast used: 45 cc   Total fluoro time: 5.1 min Air Kerma: 380 mGy  All wires and catheters removed out of the body at the end of the procedure Final angiogram showed no dissection/perforation         Estimated blood loss <50 mL.   During this procedure medications were administered to achieve and maintain moderate conscious sedation while the patient's heart rate, blood pressure, and oxygen saturation were continuously monitored and I was present face-to-face 100% of this time.     Medications  (Filter: Administrations occurring from 06/21/19 1310 to 06/21/19 1424)  Heparin (Porcine) in NaCl 1000-0.9 UT/500ML-% SOLN (mL)  Total volume: 1,000 mL  Date/Time   Rate/Dose/Volume Action  06/21/19 1332  500 mL Given  1332  500 mL Given  midazolam (VERSED) injection (mg)  Total dose: 1 mg  Date/Time   Rate/Dose/Volume Action  06/21/19 1336  1 mg Given  fentaNYL (SUBLIMAZE) injection (mcg)  Total dose: 25 mcg  Date/Time   Rate/Dose/Volume Action  06/21/19 1336  25 mcg Given  lidocaine (PF) (XYLOCAINE) 1 % injection (mL)  Total volume: 2 mL  Date/Time   Rate/Dose/Volume Action  06/21/19 1342  2 mL Given  Radial Cocktail/Verapamil only (mL)  Total volume: 20 mL  Date/Time   Rate/Dose/Volume Action  06/21/19 1343  10 mL Given  1359  10 mL Given  heparin sodium (porcine) injection (Units)  Total dose: 5,000 Units  Date/Time   Rate/Dose/Volume Action  06/21/19 1355  5,000 Units  Given  iodixanol (VISIPAQUE) 320 MG/ML injection (mL)  Total volume: 45 mL  Date/Time   Rate/Dose/Volume Action  06/21/19 1419  45 mL Given     Sedation Time  Sedation Time Physician-1: 34 minutes 14 seconds        Contrast  Medication Name Total Dose  iodixanol (VISIPAQUE) 320 MG/ML injection 45 mL     Radiation/Fluoro  Fluoro time: 5.1 (min)  DAP: 25492 (mGycm2)  Cumulative Air Kerma: 123XX123 (mGy)     Complications     Complications documented before study signed (06/21/2019 2:24 PM)  RIGHT/LEFT HEART CATH AND CORONARY ANGIOGRAPHY  None Documented by [name], MD 06/21/2019 2:19 PM  Date Found: 06/21/2019  Time Range: Intraprocedure       Coronary Findings  Diagnostic  Dominance: Left  Left Main  Vessel is normal in caliber. Vessel is angiographically normal.   Left Anterior Descending  Vessel is normal in caliber. Medial wall calcium seen.  Mid LAD lesion 40% stenosed  Mid LAD lesion is 40% stenosed.   Left Circumflex  Vessel is large. Vessel is angiographically normal. Medial wall calcium seen.  Intervention  No interventions have been documented.          Right Heart  Right Heart Pressures RA: 3 mmHg RV: 27/2 mmHg PA: 28/11 mmHg, mean PAP 19 mmHg PCW: 7 mmHg  CO: 6 L/min CI: 2.8 L/min/m2                 Left Heart  Left Ventricle Not performed     Coronary Diagrams  Diagnostic  Dominance: Left  &&&&&  Intervention       Implants  No implant documentation for this case.  Syngo Images Link to Procedure Log  Show images for CARDIAC CATHETERIZATION Procedure Log     Images on Long Term Storage   Show images for Aul, Don[place]edian            Hemo Data    Most Recent Value  Fick Cardiac Output 6.07 L/min  Fick Cardiac Output Index 2.85 (L/min)/BSA  RA A Wave 7 mmHg  RA V Wave 4 mmHg  RA Mean 3 mmHg  RV Systolic Pressure 27 mmHg  RV Diastolic Pressure 2 mmHg  RV EDP 6 mmHg  PA Systolic Pressure 28 mmHg    PA Diastolic Pressure 11 mmHg  PA Mean 19 mmHg  PW A Wave 12 mmHg  PW V Wave 8 mmHg  PW Mean 7 mmHg  AO Systolic Pressure 123XX123 mmHg  AO Diastolic Pressure 74 mmHg  AO Mean 97 mmHg  QP/QS 1  TPVR Index 6.66 HRUI  TSVR Index 34.01 HRUI  PVR SVR Ratio 0.13  TPVR/TSVR Ratio 0.2    ADDENDUM REPORT: 06/28/2019 21:30  CLINICAL DATA: 16M with severe aortic stenosis being evaluated for  a TAVR procedure.  EXAM:  Cardiac TAVR CT  TECHNIQUE:  The patient was scanned on a [employer]. A 120 kV  retrospective scan was triggered in the descending thoracic aorta at  111 HU's. Gantry rotation speed was 250 msecs and collimation was .6  mm. No beta blockade or nitro were given. The 3D data set was  reconstructed in 5% intervals of the R-R cycle. Systolic and  diastolic phases were analyzed on a dedicated work station using  MPR, MIP and VRT modes. The patient received 80 cc of contrast.  FINDINGS:  Aortic Root:  Aortic valve: Trileaflet  Aortic valve calcium score: 5364  Aortic annulus:  Diameter: 32 mm x 26 mm  Perimeter: 36mm  Area: 612 mm^2  Calcifications: No calcifications  Coronary height: Min Left - 88mm, Max Left - 25mm; Min Right - 71mm  Sinotubular height: Left cusp - 88mm; Right cusp - 18mm; Noncoronary  cusp - 19mm  LVOT (as measured 3 mm below the annulus):  Diameter: 67mm x 23mm  Area: 610 cm^2  Calcifications: No calcifications  Aortic sinus width: Left cusp - 56mm; Right cusp - 51mm; Noncoronary  cusp - 55mm  Sinotubular junction width: 66mm x 64mm  Optimum Fluoroscopic Angle for Delivery: LAO 14 CAU 8  Cardiac:  Right atrium: Moderate enlargement  Right ventricle: Moderate dilatation  Pulmonary arteries: Normal size  Pulmonary veins: Normal configuration  Left atrium: Mild enlargement  Left ventricle: Normal size  Pericardium: Normal thickness  Coronary arteries: Calcium score 1356  IMPRESSION:  1. Trileaflet aortic valve, severely calcified  (calcium score 5364)  2. Aortic annulus measures 74mm x 78mm with perimeter 67mm and area  612 mm^2. No annular or LVOT calcifications. Annulus measurements  suitable for delivery of 30mm Edwards-Sapien 3 valve  3. Sufficient coronary to annulus distance, measuring 58mm to left  main and 1mm to RCA  4. Optimum Fluoroscopic Angle for Delivery: LAO 14 CAU 8  5. Calcium score is 1356 (70th percentile)  Electronically Signed  By: Oswaldo Milian MD  On: 06/28/2019 21:30  CLINICAL DATA: 86 year old male with history of severe aortic  stenosis. Preprocedural study prior to potential transcatheter  aortic valve replacement (TAVR) procedure.  EXAM:  CT ANGIOGRAPHY CHEST, ABDOMEN AND PELVIS  TECHNIQUE:  Non-contrast CT of the chest was initially obtained.  Multidetector CT imaging through the chest, abdomen and pelvis  was  performed using the standard protocol during bolus administration of  intravenous contrast. Multiplanar reconstructed images and MIPs were  obtained and reviewed to evaluate the vascular anatomy.  CONTRAST: 160mL OMNIPAQUE IOHEXOL 350 MG/ML SOLN  COMPARISON: No priors.  FINDINGS:  CTA CHEST FINDINGS  Cardiovascular: Heart size is mildly enlarged. There is no  significant pericardial fluid, thickening or pericardial  calcification. There is aortic atherosclerosis, as well as  atherosclerosis of the great vessels of the mediastinum and the  coronary arteries, including calcified atherosclerotic plaque in the  left main, left anterior descending, left circumflex and right  coronary arteries. Severe thickening calcification of the aortic  valve. Mild calcifications of the mitral annulus.  Mediastinum/Lymph Nodes: No pathologically enlarged mediastinal or  hilar lymph nodes. Esophagus is unremarkable in appearance. No  axillary lymphadenopathy.  Lungs/Pleura: No suspicious appearing pulmonary nodules or masses  are noted. No acute consolidative airspace disease. No  pleural  effusions.  Musculoskeletal/Soft Tissues: No suspicious cystic or solid hepatic  lesions. No intra or extrahepatic biliary ductal dilatation.  Gallbladder is normal in appearance.  CTA ABDOMEN AND PELVIS FINDINGS  Hepatobiliary: No suspicious cystic or solid hepatic lesions. No  intra or extrahepatic biliary ductal dilatation. Gallbladder is  normal in appearance.  Pancreas: No pancreatic mass. No pancreatic ductal dilatation. No  pancreatic or peripancreatic fluid collections or inflammatory  changes.  Spleen: Unremarkable.  Adrenals/Urinary Tract: Low-attenuation lesions are noted in the  kidneys bilaterally, compatible with simple cysts, largest of which  is exophytic in the lower pole of the left kidney measuring 5.5 cm  in diameter. Multiple subcentimeter low-attenuation lesions in both  kidneys, too small to definitively characterize, but statistically  likely to represent tiny cysts. Bilateral adrenal glands are normal  in appearance. No hydroureteronephrosis. Urinary bladder is normal  in appearance.  Stomach/Bowel: Normal appearance of the stomach. No pathologic  dilatation of small bowel or colon. Normal appendix.  Vascular/Lymphatic: Aortic atherosclerosis, with fusiform ectasia of  the infrarenal abdominal aorta which measures up to 2.8 x 2.7 cm in  diameter. Vascular findings and measurements pertinent to potential  TAVR procedure, as detailed below. No lymphadenopathy noted in the  abdomen or pelvis.  Reproductive: Prostate gland and seminal vesicles are unremarkable  in appearance.  Other: No significant volume of ascites. No pneumoperitoneum.  Musculoskeletal: There are no aggressive appearing lytic or blastic  lesions noted in the visualized portions of the skeleton.  VASCULAR MEASUREMENTS PERTINENT TO TAVR:  AORTA:  Minimal Aortic Diameter-18 x 17 mm  Severity of Aortic Calcification-moderate to severe  RIGHT PELVIS:  Right Common Iliac Artery -    Minimal Diameter-12.0 x 10.5 mm  Tortuosity-mild  Calcification-moderate  Right External Iliac Artery -  Minimal Diameter-10.6 x 9.3 mm  Tortuosity-extreme  Calcification-mild  Right Common Femoral Artery -  Minimal Diameter-11.2 x 11.4 mm  Tortuosity-mild  Calcification-mild  LEFT PELVIS:  Left Common Iliac Artery -  Minimal Diameter-7.4 x 7.5 mm  Tortuosity-mild  Calcification-moderate  Left External Iliac Artery -  Minimal Diameter-10.3 x 8.4 mm  Tortuosity-extreme  Calcification-mild  Left Common Femoral Artery -  Minimal Diameter-11.4 x 11.1 mm  Tortuosity-mild  Calcification-none  Review of the MIP images confirms the above findings.  IMPRESSION:  1. Vascular findings and measurements pertinent to potential TAVR  procedure, as detailed above.  2. Severe thickening calcification of the aortic valve, compatible  with the reported clinical history of severe aortic stenosis.  3. Mild cardiomegaly.  4. Left main and  3 vessel coronary artery disease.  5. Additional incidental findings, as above.  Electronically Signed  By: Vinnie Langton M.D.  On: 06/28/2019 13:27    STS Risk Score:   Risk of Mortality:  2.105%  Renal Failure:  3.346%  Permanent Stroke:  1.852%  Prolonged Ventilation:  7.936%  DSW Infection:  0.116%  Reoperation:  4.157%  Morbidity or Mortality:  13.916%  Short Length of Stay:  24.829%  Long Length of Stay:  6.969%    Impression:   This 86 year old gentleman has stage D, severe, symptomatic aortic stenosis with New York Heart Association class II symptoms of exertional fatigue and shortness of breath consistent with chronic diastolic congestive heart failure. I have personally reviewed his 2D echocardiogram, cardiac catheterization, and CTA studies. His echocardiogram shows a trileaflet aortic valve with thickened and calcified leaflets with restricted mobility. The mean gradient is 40 mmHg consistent with severe aortic stenosis. Left  ventricular systolic function is normal. Cardiac catheterization shows no significant coronary disease. I agree that aortic valve replacement is indicated in this active gentleman for relief of his symptoms and to prevent progressive left ventricular deterioration. Given his age I think transcatheter aortic valve replacement would be the best option for him. His gated cardiac CTA shows anatomy suitable for transcatheter aortic valve replacement using a 29 mm SAPIEN 3 valve. His abdominal and pelvic CTA shows adequate pelvic vascular anatomy to allow transfemoral insertion.  The patient and his wife were counseled at length regarding treatment alternatives for management of severe symptomatic aortic stenosis. The risks and benefits of surgical intervention has been discussed in detail. Long-term prognosis with medical therapy was discussed. Alternative approaches such as conventional surgical aortic valve replacement, transcatheter aortic valve replacement, and palliative medical therapy were compared and contrasted at length. This discussion was placed in the context of the patient's own specific clinical presentation and past medical history. All of their questions have been addressed.  Following the decision to proceed with transcatheter aortic valve replacement, a discussion was held regarding what types of management strategies would be attempted intraoperatively in the event of life-threatening complications, including whether or not the patient would be considered a candidate for the use of cardiopulmonary bypass and/or conversion to open sternotomy for attempted surgical intervention. The patient is aware of the fact that transient use of cardiopulmonary bypass may be necessary. He is 86 years old but in good physical condition and I think he would be a candidate for emergent sternotomy if needed to manage any intraoperative complications. The patient has been advised of a variety of complications that  might develop including but not limited to risks of death, stroke, paravalvular leak, aortic dissection or other major vascular complications, aortic annulus rupture, device embolization, cardiac rupture or perforation, mitral regurgitation, acute myocardial infarction, arrhythmia, heart block or bradycardia requiring permanent pacemaker placement, congestive heart failure, respiratory failure, renal failure, pneumonia, infection, other late complications related to structural valve deterioration or migration, or other complications that might ultimately cause a temporary or permanent loss of functional independence or other long term morbidity. The patient provides full informed consent for the procedure as described and all questions were answered.   Plan:   Transfemoral transcatheter aortic valve replacement using a SAPIEN 3 valve.   Gaye Pollack, MD

## 2019-07-05 ENCOUNTER — Inpatient Hospital Stay (HOSPITAL_COMMUNITY): Payer: Medicare Other | Admitting: Vascular Surgery

## 2019-07-05 ENCOUNTER — Inpatient Hospital Stay (HOSPITAL_COMMUNITY)
Admission: RE | Admit: 2019-07-05 | Discharge: 2019-07-06 | DRG: 267 | Disposition: A | Discharge status: Home / Self Care | Payer: Medicare Other | Attending: Surgery | Admitting: Surgery

## 2019-07-05 ENCOUNTER — Other Ambulatory Visit: Payer: Self-pay

## 2019-07-05 ENCOUNTER — Inpatient Hospital Stay (HOSPITAL_BASED_OUTPATIENT_CLINIC_OR_DEPARTMENT_OTHER): Payer: Medicare Other

## 2019-07-05 ENCOUNTER — Inpatient Hospital Stay (HOSPITAL_COMMUNITY): Payer: Medicare Other

## 2019-07-05 ENCOUNTER — Encounter (HOSPITAL_COMMUNITY)
Admission: RE | Disposition: A | Discharge status: Home / Self Care | Payer: Self-pay | Source: Home / Self Care | Attending: Surgery

## 2019-07-05 ENCOUNTER — Encounter (HOSPITAL_COMMUNITY): Payer: Self-pay | Admitting: Cardiovascular Disease

## 2019-07-05 ENCOUNTER — Inpatient Hospital Stay (HOSPITAL_COMMUNITY): Payer: Medicare Other | Admitting: Anesthesiology

## 2019-07-05 DIAGNOSIS — E7800 Pure hypercholesterolemia, unspecified: Secondary | ICD-10-CM | POA: Diagnosis present

## 2019-07-05 DIAGNOSIS — Z791 Long term (current) use of non-steroidal anti-inflammatories (NSAID): Secondary | ICD-10-CM | POA: Diagnosis not present

## 2019-07-05 DIAGNOSIS — Z79899 Other long term (current) drug therapy: Secondary | ICD-10-CM | POA: Diagnosis not present

## 2019-07-05 DIAGNOSIS — D62 Acute posthemorrhagic anemia: Secondary | ICD-10-CM | POA: Diagnosis not present

## 2019-07-05 DIAGNOSIS — I350 Nonrheumatic aortic (valve) stenosis: Secondary | ICD-10-CM

## 2019-07-05 DIAGNOSIS — I6523 Occlusion and stenosis of bilateral carotid arteries: Secondary | ICD-10-CM

## 2019-07-05 DIAGNOSIS — I440 Atrioventricular block, first degree: Secondary | ICD-10-CM | POA: Diagnosis not present

## 2019-07-05 DIAGNOSIS — Z8249 Family history of ischemic heart disease and other diseases of the circulatory system: Secondary | ICD-10-CM

## 2019-07-05 DIAGNOSIS — Z006 Encounter for examination for normal comparison and control in clinical research program: Secondary | ICD-10-CM

## 2019-07-05 DIAGNOSIS — I10 Essential (primary) hypertension: Secondary | ICD-10-CM | POA: Diagnosis present

## 2019-07-05 DIAGNOSIS — Z7982 Long term (current) use of aspirin: Secondary | ICD-10-CM | POA: Diagnosis not present

## 2019-07-05 DIAGNOSIS — Z20822 Contact with and (suspected) exposure to covid-19: Secondary | ICD-10-CM | POA: Diagnosis present

## 2019-07-05 DIAGNOSIS — R079 Chest pain, unspecified: Secondary | ICD-10-CM | POA: Diagnosis not present

## 2019-07-05 DIAGNOSIS — Z954 Presence of other heart-valve replacement: Secondary | ICD-10-CM | POA: Diagnosis not present

## 2019-07-05 DIAGNOSIS — Z952 Presence of prosthetic heart valve: Secondary | ICD-10-CM | POA: Diagnosis not present

## 2019-07-05 DIAGNOSIS — N529 Male erectile dysfunction, unspecified: Secondary | ICD-10-CM | POA: Diagnosis present

## 2019-07-05 DIAGNOSIS — I110 Hypertensive heart disease with heart failure: Secondary | ICD-10-CM | POA: Diagnosis present

## 2019-07-05 DIAGNOSIS — R000 Tachycardia, unspecified: Secondary | ICD-10-CM | POA: Diagnosis not present

## 2019-07-05 DIAGNOSIS — Z87891 Personal history of nicotine dependence: Secondary | ICD-10-CM

## 2019-07-05 DIAGNOSIS — I5032 Chronic diastolic (congestive) heart failure: Secondary | ICD-10-CM | POA: Diagnosis present

## 2019-07-05 DIAGNOSIS — E785 Hyperlipidemia, unspecified: Secondary | ICD-10-CM | POA: Diagnosis present

## 2019-07-05 DIAGNOSIS — Z820 Family history of epilepsy and other diseases of the nervous system: Secondary | ICD-10-CM

## 2019-07-05 DIAGNOSIS — I2510 Atherosclerotic heart disease of native coronary artery without angina pectoris: Secondary | ICD-10-CM | POA: Diagnosis present

## 2019-07-05 DIAGNOSIS — K219 Gastro-esophageal reflux disease without esophagitis: Secondary | ICD-10-CM | POA: Diagnosis present

## 2019-07-05 DIAGNOSIS — I088 Other rheumatic multiple valve diseases: Principal | ICD-10-CM | POA: Diagnosis present

## 2019-07-05 DIAGNOSIS — I083 Combined rheumatic disorders of mitral, aortic and tricuspid valves: Secondary | ICD-10-CM | POA: Diagnosis not present

## 2019-07-05 DIAGNOSIS — R58 Hemorrhage, not elsewhere classified: Secondary | ICD-10-CM | POA: Diagnosis not present

## 2019-07-05 HISTORY — PX: TRANSCATHETER AORTIC VALVE REPLACEMENT, TRANSFEMORAL: SHX6400

## 2019-07-05 HISTORY — PX: INTRAOPERATIVE TRANSTHORACIC ECHOCARDIOGRAM: SHX6523

## 2019-07-05 HISTORY — DX: Presence of prosthetic heart valve: Z95.2

## 2019-07-05 LAB — POCT I-STAT, CHEM 8
BUN: 17 mg/dL (ref 8–23)
BUN: 16 mg/dL (ref 8–23)
BUN: 16 mg/dL (ref 8–23)
BUN: 15 mg/dL (ref 8–23)
Calcium, Ion: 1.29 mmol/L (ref 1.15–1.40)
Calcium, Ion: 1.23 mmol/L (ref 1.15–1.40)
Calcium, Ion: 1.25 mmol/L (ref 1.15–1.40)
Calcium, Ion: 1.24 mmol/L (ref 1.15–1.40)
Chloride: 105 mmol/L (ref 98–111)
Chloride: 105 mmol/L (ref 98–111)
Chloride: 106 mmol/L (ref 98–111)
Chloride: 106 mmol/L (ref 98–111)
Creatinine, Ser: 0.9 mg/dL (ref 0.61–1.24)
Creatinine, Ser: 0.8 mg/dL (ref 0.61–1.24)
Creatinine, Ser: 1 mg/dL (ref 0.61–1.24)
Creatinine, Ser: 0.9 mg/dL (ref 0.61–1.24)
Glucose, Bld: 130 mg/dL — ABNORMAL HIGH (ref 70–99)
Glucose, Bld: 142 mg/dL — ABNORMAL HIGH (ref 70–99)
Glucose, Bld: 143 mg/dL — ABNORMAL HIGH (ref 70–99)
Glucose, Bld: 143 mg/dL — ABNORMAL HIGH (ref 70–99)
HCT: 36 % — ABNORMAL LOW (ref 39.0–52.0)
HCT: 30 % — ABNORMAL LOW (ref 39.0–52.0)
HCT: 32 % — ABNORMAL LOW (ref 39.0–52.0)
HCT: 31 % — ABNORMAL LOW (ref 39.0–52.0)
Hemoglobin: 12.2 g/dL — ABNORMAL LOW (ref 13.0–17.0)
Hemoglobin: 10.2 g/dL — ABNORMAL LOW (ref 13.0–17.0)
Hemoglobin: 10.9 g/dL — ABNORMAL LOW (ref 13.0–17.0)
Hemoglobin: 10.5 g/dL — ABNORMAL LOW (ref 13.0–17.0)
Potassium: 4.2 mmol/L (ref 3.5–5.1)
Potassium: 4.4 mmol/L (ref 3.5–5.1)
Potassium: 4.5 mmol/L (ref 3.5–5.1)
Potassium: 4.5 mmol/L (ref 3.5–5.1)
Sodium: 143 mmol/L (ref 135–145)
Sodium: 142 mmol/L (ref 135–145)
Sodium: 142 mmol/L (ref 135–145)
Sodium: 140 mmol/L (ref 135–145)
TCO2: 25 mmol/L (ref 22–32)
TCO2: 25 mmol/L (ref 22–32)
TCO2: 27 mmol/L (ref 22–32)
TCO2: 24 mmol/L (ref 22–32)

## 2019-07-05 SURGERY — IMPLANTATION, AORTIC VALVE, TRANSCATHETER, FEMORAL APPROACH
Anesthesia: Monitor Anesthesia Care | Site: Chest

## 2019-07-05 MED ORDER — LIDOCAINE HCL 1 % IJ SOLN
INTRAMUSCULAR | Status: DC | PRN
  Administered 2019-07-05: 7 mL

## 2019-07-05 MED ORDER — CLOPIDOGREL BISULFATE 75 MG PO TABS
75.0000 mg | ORAL_TABLET | Freq: Every day | ORAL | Status: DC
  Administered 2019-07-06: 08:00:00 75 mg via ORAL
  Filled 2019-07-05: qty 1

## 2019-07-05 MED ORDER — ASPIRIN EC 81 MG PO TBEC
81.0000 mg | DELAYED_RELEASE_TABLET | Freq: Every day | ORAL | Status: DC
  Administered 2019-07-06: 08:00:00 81 mg via ORAL
  Filled 2019-07-05: qty 1

## 2019-07-05 MED ORDER — CHLORHEXIDINE GLUCONATE 4 % EX LIQD: 60.0000 mL | Freq: Once | CUTANEOUS | Status: DC

## 2019-07-05 MED ORDER — ACETAMINOPHEN 650 MG RE SUPP: 650.0000 mg | Freq: Four times a day (QID) | RECTAL | Status: DC | PRN

## 2019-07-05 MED ORDER — ACETAMINOPHEN 325 MG PO TABS: 650.0000 mg | ORAL_TABLET | Freq: Four times a day (QID) | ORAL | Status: DC | PRN

## 2019-07-05 MED ORDER — HEPARIN SODIUM (PORCINE) 1000 UNIT/ML IJ SOLN
INTRAMUSCULAR | Status: DC | PRN
  Administered 2019-07-05: 14000 [IU] via INTRAVENOUS

## 2019-07-05 MED ORDER — SODIUM CHLORIDE 0.9 % IV BOLUS
500.0000 mL | Freq: Once | INTRAVENOUS | Status: AC
  Administered 2019-07-05: 13:00:00 500 mL via INTRAVENOUS

## 2019-07-05 MED ORDER — FENTANYL CITRATE (PF) 250 MCG/5ML IJ SOLN
INTRAMUSCULAR | Status: AC
  Filled 2019-07-05: qty 5

## 2019-07-05 MED ORDER — LIDOCAINE HCL (PF) 1 % IJ SOLN
INTRAMUSCULAR | Status: AC
  Filled 2019-07-05: qty 30

## 2019-07-05 MED ORDER — SODIUM CHLORIDE 0.9% FLUSH: 3.0000 mL | INTRAVENOUS | Status: DC | PRN

## 2019-07-05 MED ORDER — LACTATED RINGERS IV SOLN: INTRAVENOUS | Status: DC | PRN

## 2019-07-05 MED ORDER — SODIUM CHLORIDE 0.9% FLUSH
3.0000 mL | Freq: Two times a day (BID) | INTRAVENOUS | Status: DC
  Administered 2019-07-06: 08:00:00 3 mL via INTRAVENOUS

## 2019-07-05 MED ORDER — SODIUM CHLORIDE 0.9 % IV SOLN
INTRAVENOUS | Status: DC | PRN
  Administered 2019-07-05: 1500 mL

## 2019-07-05 MED ORDER — SODIUM CHLORIDE 0.9 % IV SOLN
1.5000 g | Freq: Two times a day (BID) | INTRAVENOUS | Status: DC
  Administered 2019-07-05 – 2019-07-06 (×2): 1.5 g via INTRAVENOUS
  Filled 2019-07-05 (×4): qty 1.5

## 2019-07-05 MED ORDER — MORPHINE SULFATE (PF) 2 MG/ML IV SOLN: 1.0000 mg | INTRAVENOUS | Status: DC | PRN

## 2019-07-05 MED ORDER — ROSUVASTATIN CALCIUM 20 MG PO TABS
40.0000 mg | ORAL_TABLET | Freq: Every day | ORAL | Status: DC
  Administered 2019-07-05: 17:00:00 40 mg via ORAL
  Filled 2019-07-05: qty 2

## 2019-07-05 MED ORDER — TRAMADOL HCL 50 MG PO TABS: 50.0000 mg | ORAL_TABLET | ORAL | Status: DC | PRN

## 2019-07-05 MED ORDER — PROTAMINE SULFATE 10 MG/ML IV SOLN
INTRAVENOUS | Status: DC | PRN
  Administered 2019-07-05: 30 mg via INTRAVENOUS
  Administered 2019-07-05: 10 mg via INTRAVENOUS
  Administered 2019-07-05 (×2): 50 mg via INTRAVENOUS

## 2019-07-05 MED ORDER — NITROGLYCERIN IN D5W 200-5 MCG/ML-% IV SOLN: 0.0000 ug/min | INTRAVENOUS | Status: DC

## 2019-07-05 MED ORDER — LORATADINE 10 MG PO TABS: 10.0000 mg | ORAL_TABLET | Freq: Every day | ORAL | Status: DC

## 2019-07-05 MED ORDER — ONDANSETRON HCL 4 MG/2ML IJ SOLN: 4.0000 mg | Freq: Four times a day (QID) | INTRAMUSCULAR | Status: DC | PRN

## 2019-07-05 MED ORDER — CHLORHEXIDINE GLUCONATE 0.12 % MT SOLN
15.0000 mL | Freq: Once | OROMUCOSAL | Status: AC
  Administered 2019-07-05: 15 mL via OROMUCOSAL
  Filled 2019-07-05: qty 15

## 2019-07-05 MED ORDER — SODIUM CHLORIDE 0.9 % IV SOLN: 250.0000 mL | INTRAVENOUS | Status: DC | PRN

## 2019-07-05 MED ORDER — FENTANYL CITRATE (PF) 250 MCG/5ML IJ SOLN
INTRAMUSCULAR | Status: DC | PRN
  Administered 2019-07-05: 50 ug via INTRAVENOUS

## 2019-07-05 MED ORDER — SODIUM CHLORIDE 0.9 % IV SOLN
INTRAVENOUS | Status: AC
  Filled 2019-07-05 (×3): qty 1.2

## 2019-07-05 MED ORDER — VANCOMYCIN HCL IN DEXTROSE 1-5 GM/200ML-% IV SOLN
1000.0000 mg | Freq: Once | INTRAVENOUS | Status: AC
  Administered 2019-07-05: 20:00:00 1000 mg via INTRAVENOUS
  Filled 2019-07-05: qty 200

## 2019-07-05 MED ORDER — OXYCODONE HCL 5 MG PO TABS: 5.0000 mg | ORAL_TABLET | ORAL | Status: DC | PRN

## 2019-07-05 MED ORDER — PROPOFOL 10 MG/ML IV BOLUS
INTRAVENOUS | Status: AC
  Filled 2019-07-05: qty 20

## 2019-07-05 MED ORDER — CHLORHEXIDINE GLUCONATE 4 % EX LIQD: 30.0000 mL | CUTANEOUS | Status: DC

## 2019-07-05 MED ORDER — SODIUM CHLORIDE 0.9 % IV SOLN: INTRAVENOUS | Status: DC

## 2019-07-05 MED ORDER — PHENYLEPHRINE HCL-NACL 10-0.9 MG/250ML-% IV SOLN
INTRAVENOUS | Status: DC | PRN
  Administered 2019-07-05 (×2): 25 ug/min via INTRAVENOUS

## 2019-07-05 MED ORDER — SODIUM CHLORIDE 0.9 % IV SOLN: INTRAVENOUS | Status: AC

## 2019-07-05 MED ORDER — IODIXANOL 320 MG/ML IV SOLN
INTRAVENOUS | Status: DC | PRN
  Administered 2019-07-05: 60 mL

## 2019-07-05 MED ORDER — PHENYLEPHRINE HCL-NACL 20-0.9 MG/250ML-% IV SOLN
0.0000 ug/min | INTRAVENOUS | Status: DC
  Filled 2019-07-05: qty 250

## 2019-07-05 MED ORDER — PROPOFOL 500 MG/50ML IV EMUL
INTRAVENOUS | Status: DC | PRN
  Administered 2019-07-05: 15 ug/kg/min via INTRAVENOUS

## 2019-07-05 SURGICAL SUPPLY — 68 items
ADH SKN CLS APL DERMABOND .7 (GAUZE/BANDAGES/DRESSINGS) ×2
APL PRP STRL LF DISP 70% ISPRP (MISCELLANEOUS) ×4
BAG DECANTER FOR FLEXI CONT (MISCELLANEOUS) ×2 IMPLANT
BAG SNAP BAND KOVER 36X36 (MISCELLANEOUS) ×4 IMPLANT
BLADE CLIPPER SURG (BLADE) IMPLANT
CABLE ADAPT CONN TEMP 6FT (ADAPTER) ×4 IMPLANT
CATH DIAG EXPO 6F AL1 (CATHETERS) IMPLANT
CATH DIAG EXPO 6F FR4 (CATHETERS) ×2 IMPLANT
CATH DIAG EXPO 6F VENT PIG 145 (CATHETERS) ×8 IMPLANT
CATH EXTERNAL FEMALE PUREWICK (CATHETERS) IMPLANT
CATH INFINITI 6F AL2 (CATHETERS) IMPLANT
CATH INFINITI JR4 5F (CATHETERS) ×2 IMPLANT
CATH S G BIP PACING (CATHETERS) ×4 IMPLANT
CHLORAPREP W/TINT 26 (MISCELLANEOUS) ×6 IMPLANT
CNTNR URN SCR LID CUP LEK RST (MISCELLANEOUS) ×4 IMPLANT
CONT SPEC 4OZ STRL OR WHT (MISCELLANEOUS) ×8
COVER BACK TABLE 80X110 HD (DRAPES) ×4 IMPLANT
DERMABOND ADVANCED (GAUZE/BANDAGES/DRESSINGS) ×2
DERMABOND ADVANCED .7 DNX12 (GAUZE/BANDAGES/DRESSINGS) ×2 IMPLANT
DEVICE CLOSURE PERCLS PRGLD 6F (VASCULAR PRODUCTS) ×4 IMPLANT
DEVICE RAD COMP TR BAND LRG (VASCULAR PRODUCTS) ×2 IMPLANT
DRAPE BRACHIAL (DRAPES) ×2 IMPLANT
DRSG TEGADERM 4X4.75 (GAUZE/BANDAGES/DRESSINGS) ×8 IMPLANT
ELECT REM PT RETURN 9FT ADLT (ELECTROSURGICAL) ×4
ELECTRODE REM PT RTRN 9FT ADLT (ELECTROSURGICAL) ×4 IMPLANT
GAUZE SPONGE 4X4 12PLY STRL (GAUZE/BANDAGES/DRESSINGS) ×4 IMPLANT
GLIDESHEATH SLEND SS 6F .021 (SHEATH) ×2 IMPLANT
GLOVE BIO SURGEON STRL SZ7.5 (GLOVE) ×4 IMPLANT
GLOVE BIO SURGEON STRL SZ8 (GLOVE) IMPLANT
GLOVE EUDERMIC 7 POWDERFREE (GLOVE) IMPLANT
GLOVE ORTHO TXT STRL SZ7.5 (GLOVE) IMPLANT
GOWN STRL REUS W/ TWL LRG LVL3 (GOWN DISPOSABLE) IMPLANT
GOWN STRL REUS W/ TWL XL LVL3 (GOWN DISPOSABLE) ×2 IMPLANT
GOWN STRL REUS W/TWL LRG LVL3 (GOWN DISPOSABLE)
GOWN STRL REUS W/TWL XL LVL3 (GOWN DISPOSABLE) ×4
GUIDEWIRE INQWIRE 1.5J.035X260 (WIRE) IMPLANT
GUIDEWIRE SAFE TJ AMPLATZ EXST (WIRE) ×4 IMPLANT
INQWIRE 1.5J .035X260CM (WIRE) ×4
KIT BASIN OR (CUSTOM PROCEDURE TRAY) ×4 IMPLANT
KIT HEART LEFT (KITS) ×4 IMPLANT
KIT TURNOVER KIT B (KITS) ×4 IMPLANT
NS IRRIG 1000ML POUR BTL (IV SOLUTION) ×4 IMPLANT
PACK ENDO MINOR (CUSTOM PROCEDURE TRAY) ×4 IMPLANT
PAD ARMBOARD 7.5X6 YLW CONV (MISCELLANEOUS) ×8 IMPLANT
PAD ELECT DEFIB RADIOL ZOLL (MISCELLANEOUS) ×4 IMPLANT
PERCLOSE PROGLIDE 6F (VASCULAR PRODUCTS) ×16
POSITIONER HEAD DONUT 9IN (MISCELLANEOUS) ×4 IMPLANT
SET MICROPUNCTURE 5F STIFF (MISCELLANEOUS) ×4 IMPLANT
SHEATH BRITE TIP 7FR 35CM (SHEATH) ×4 IMPLANT
SHEATH PINNACLE 5F 10CM (SHEATH) ×2 IMPLANT
SHEATH PINNACLE 6F 10CM (SHEATH) ×4 IMPLANT
SHEATH PINNACLE 8F 10CM (SHEATH) ×4 IMPLANT
SLEEVE REPOSITIONING LENGTH 30 (MISCELLANEOUS) ×4 IMPLANT
SPONGE LAP 18X18 X RAY DECT (DISPOSABLE) ×2 IMPLANT
STOPCOCK MORSE 400PSI 3WAY (MISCELLANEOUS) ×8 IMPLANT
SUT SILK  1 MH (SUTURE) ×4
SUT SILK 1 MH (SUTURE) ×2 IMPLANT
SYR 50ML LL SCALE MARK (SYRINGE) ×4 IMPLANT
SYR MEDRAD MARK V 150ML (SYRINGE) ×4 IMPLANT
TOWEL GREEN STERILE (TOWEL DISPOSABLE) ×8 IMPLANT
TRANSDUCER DISP STR W/STOPCOCK (MISCELLANEOUS) ×2 IMPLANT
TRANSDUCER W/STOPCOCK (MISCELLANEOUS) ×8 IMPLANT
TUBING ART PRESS 72  MALE/FEM (TUBING) ×4
TUBING ART PRESS 72 MALE/FEM (TUBING) IMPLANT
VALVE HEART TRANSCATH SZ3 29MM (Valve) ×2 IMPLANT
WIRE EMERALD 3MM-J .035X150CM (WIRE) ×4 IMPLANT
WIRE EMERALD 3MM-J .035X260CM (WIRE) ×4 IMPLANT
WIRE HI TORQ VERSACORE J 260CM (WIRE) ×2 IMPLANT

## 2019-07-05 NOTE — Op Note (Signed)
[facility]   MULTIDISCIPLINARY HEART VALVE TEAM   TAVR OPERATIVE NOTE   Date of Procedure:  07/05/2019  Preoperative Diagnosis: Severe Aortic Stenosis   Postoperative Diagnosis: Same   Procedure:    Transcatheter Aortic Valve Replacement - Percutaneous Left Transfemoral Approach  Edwards Sapien 3 THV (size 29 mm, model # 9600TFX, serial # [id number])   Co-Surgeons:  Gaye Pollack, MD and Lauree Chandler, MD   Anesthesiologist:  Annye Asa, MD  Echocardiographer:  Ena Dawley, MD  Pre-operative Echo Findings:  Severe aortic stenosis  Normal left ventricular systolic function  Post-operative Echo Findings:  No paravalvular leak  Normal left ventricular systolic function   BRIEF CLINICAL NOTE AND INDICATIONS FOR SURGERY  This 86 year old gentleman has stage D, severe, symptomatic aortic stenosis with New York Heart Association class II symptoms of exertional fatigue and shortness of breath consistent with chronic diastolic congestive heart failure. I have personally reviewed his 2D echocardiogram, cardiac catheterization, and CTA studies. His echocardiogram shows a trileaflet aortic valve with thickened and calcified leaflets with restricted mobility. The mean gradient is 40 mmHg consistent with severe aortic stenosis. Left ventricular systolic function is normal. Cardiac catheterization shows no significant coronary disease. I agree that aortic valve replacement is indicated in this active gentleman for relief of his symptoms and to prevent progressive left ventricular deterioration. Given his age I think transcatheter aortic valve replacement would be the best option for him. His gated cardiac CTA shows anatomy suitable for transcatheter aortic valve replacement using a 29 mm SAPIEN 3 valve. His abdominal and pelvic CTA shows adequate pelvic vascular anatomy to allow transfemoral insertion.  The patient and his wife were counseled at length  regarding treatment alternatives for management of severe symptomatic aortic stenosis. The risks and benefits of surgical intervention has been discussed in detail. Long-term prognosis with medical therapy was discussed. Alternative approaches such as conventional surgical aortic valve replacement, transcatheter aortic valve replacement, and palliative medical therapy were compared and contrasted at length. This discussion was placed in the context of the patient's own specific clinical presentation and past medical history. All of their questions have been addressed.  Following the decision to proceed with transcatheter aortic valve replacement, a discussion was held regarding what types of management strategies would be attempted intraoperatively in the event of life-threatening complications, including whether or not the patient would be considered a candidate for the use of cardiopulmonary bypass and/or conversion to open sternotomy for attempted surgical intervention. The patient is aware of the fact that transient use of cardiopulmonary bypass may be necessary. He is 86 years old but in good physical condition and I think he would be a candidate for emergent sternotomy if needed to manage any intraoperative complications. The patient has been advised of a variety of complications that might develop including but not limited to risks of death, stroke, paravalvular leak, aortic dissection or other major vascular complications, aortic annulus rupture, device embolization, cardiac rupture or perforation, mitral regurgitation, acute myocardial infarction, arrhythmia, heart block or bradycardia requiring permanent pacemaker placement, congestive heart failure, respiratory failure, renal failure, pneumonia, infection, other late complications related to structural valve deterioration or migration, or other complications that might ultimately cause a temporary or permanent loss of functional independence or other  long term morbidity. The patient provides full informed consent for the procedure as described and all questions were answered.     DETAILS OF THE OPERATIVE PROCEDURE  PREPARATION:    The patient was brought to the  operating room on the above mentioned date and appropriate monitoring was established by the anesthesia team. The patient was placed in the supine position on the operating table.  Intravenous antibiotics were administered. The patient was monitored closely throughout the procedure under conscious sedation.  Baseline transthoracic echocardiogram was performed. The patient's abdomen and both groins were prepped and draped in a sterile manner. A time out procedure was performed.   PERIPHERAL ACCESS:    Using the modified Seldinger technique, femoral arterial and venous access was obtained with placement of 6 Fr sheaths on the left side.  A pigtail diagnostic catheter was passed through the left arterial sheath under fluoroscopic guidance into the aortic root.  A temporary transvenous pacemaker catheter was passed through the left femoral venous sheath under fluoroscopic guidance into the right ventricle.  The pacemaker was tested to ensure stable lead placement and pacemaker capture. Aortic root angiography was performed in order to determine the optimal angiographic angle for valve deployment.   TRANSFEMORAL ACCESS:   Percutaneous transfemoral access and sheath placement was performed using ultrasound guidance.  The right common femoral artery was cannulated using a micropuncture needle and appropriate location was verified.  A pair of Abbott Perclose percutaneous closure devices were placed and a 6 French sheath replaced into the femoral artery.  The patient was heparinized systemically and ACT verified > 250 seconds.  Unfortunately we were not able to pass the stiff wire due to tortuosity of the vessels and kinking of the shealth. We tried to reinsert the dilator into the sheath but  it would not pass. Therefore the sheath was removed and the closure devices completed with good hemostasis. Manual pressure was held.  The left arm was put out on an arm board and prepped and draped. The left radial artery was accessed and a J-wire passed into the aortic root. A pigtail was passed over this into the aortic root.   The left femoral artery sheath was then removed over a wire and Perclose closure devices were placed. The sheath was replaced and the super stiff wire was then inserted. A 16 Fr transfemoral E-sheath was introduced into the left common femoral artery after progressively dilating over an Amplatz superstiff wire. An AL-2 catheter was used to direct a straight-tip exchange length wire across the native aortic valve into the left ventricle. This was exchanged out for a pigtail catheter and position was confirmed in the LV apex. Simultaneous LV and Ao pressures were recorded.  The pigtail catheter was exchanged for an Amplatz Extra-stiff wire in the LV apex.     BALLOON AORTIC VALVULOPLASTY:   Not performed  TRANSCATHETER HEART VALVE DEPLOYMENT:   An Edwards Sapien 3  transcatheter heart valve (size 29 mm, model #9600TFX, serial [id number]) was prepared and crimped per manufacturer's guidelines, and the proper orientation of the valve is confirmed on the [employer] delivery system. The valve was advanced through the introducer sheath using normal technique until in an appropriate position in the abdominal aorta beyond the sheath tip. The balloon was then retracted and using the fine-tuning wheel was centered on the valve. The valve was then advanced across the aortic arch using appropriate flexion of the catheter. The valve was carefully positioned across the aortic valve annulus. The Commander catheter was retracted using normal technique. Once final position of the valve has been confirmed by angiographic assessment, the valve is deployed while temporarily holding  ventilation and during rapid ventricular pacing to maintain systolic blood pressure < 50 mmHg  and pulse pressure < 10 mmHg. The balloon inflation is held for >3 seconds after reaching full deployment volume. Once the balloon has fully deflated the balloon is retracted into the ascending aorta and valve function is assessed using echocardiography. There is felt to be no paravalvular leak and no central aortic insufficiency.  The patient's hemodynamic recovery following valve deployment is good.  The deployment balloon and guidewire are both removed.    PROCEDURE COMPLETION:   The sheath was removed and femoral artery closure performed.  Protamine was administered once femoral arterial repair was complete. The temporary pacemaker, pigtail catheters and femoral sheaths were removed with manual pressure used for hemostasis.  A TR Band was use for left radial artery closure.  The patient tolerated the procedure well and is transported to the cath lab recovery area in stable condition. There were no immediate intraoperative complications. All sponge instrument and needle counts are verified correct at completion of the operation.   No blood products were administered during the operation.  The patient received a total of 60 mL of intravenous contrast during the procedure.   Gaye Pollack, MD 07/05/2019

## 2019-07-05 NOTE — Progress Notes (Signed)
Report given to Erin, RN

## 2019-07-05 NOTE — Progress Notes (Signed)
  [place] VALVE TEAM  Patient doing well s/p TAVR. He is hemodynamically stable, but BP has been soft. Treated with 750 cc's normal saline with improvement of pressures. Groin sites stable. ECG with sinus brady but no high grade block. Arterial line discontinued and transferred  to 4E. Plan for early ambulation after bedrest completed and hopeful discharge over the next 24-48 hours.   Angelena Form PA-C  [facility]  Pager [phone]

## 2019-07-05 NOTE — Progress Notes (Signed)
Pt ambulated 100 ft with rolling walker on room air. Pt felt lightheaded while ambulating, pt sat and rested x1 min. Pt walked back to room, BP 115/57 HR 102. Pt resting comfortably in bed. Left groin level 0, right groin level 1. Will continue to monitor.  Amanda Cockayne, RN

## 2019-07-05 NOTE — Progress Notes (Signed)
Pt transferred to 4E-11 via bed from cath lab. Pt given CHG bath. Tele applied, CCMD notified. Pt oriented to call bell, bed and room. Call bell within reach. VSS. Bilateral groins level 0. L TR band with 10 cc air in band. Will continue to monitor.  Amanda Cockayne, RN

## 2019-07-05 NOTE — CV Procedure (Signed)
[facility]  TAVR OPERATIVE NOTE   Date of Procedure:  07/05/2019  Preoperative Diagnosis: Severe Aortic Stenosis   Postoperative Diagnosis: Same   Procedure:    Transcatheter Aortic Valve Replacement - Transfemoral Approach  Edwards Sapien 3 THV (size 29 mm, model #[id number], serial # [id number])   Co-Surgeons:  Lauree Chandler, MD and Gaye Pollack, MD   Anesthesiologist:  Glennon Mac  Echocardiographer:  Meda Coffee  Pre-operative Echo Findings:  Severe aortic stenosis  Normal left ventricular systolic function  Post-operative Echo Findings:  No paravalvular leak  Normal left ventricular systolic function  BRIEF CLINICAL NOTE AND INDICATIONS FOR SURGERY   86 yo male with history of carotid artery disease, HTN, hyperlipidemia and severe aortic stenosis who is here today for TAVR. He has been followed for moderate aortic stenosis. Most recent echo 04/27/19, personally reviewed by me, with LVEF=60-65%. The aortic valve leaflets are thickened and calcified with limited leaflet excursion. Mean gradient 40 mmHg, AVA 0.8 cm2, dimensionless index 0.18. Cardiac cath planned next week per Dr. Virgina Jock. He is known to have carotid artery disease. Most recent carotid artery dopplers 03/30/19 with moderate bilateral carotid artery disease.   During the course of the patient's preoperative work up they have been evaluated comprehensively by a multidisciplinary team of specialists coordinated through the [facility] in the [facility].  They have been demonstrated to suffer from symptomatic severe aortic stenosis as noted above. The patient has been counseled extensively as to the relative risks and benefits of all options for the treatment of severe aortic stenosis including long term medical therapy, conventional surgery for aortic valve replacement, and transcatheter aortic valve replacement.  The patient has been independently  evaluated by Dr. Cyndia Bent with CT surgery and they are felt to be at high risk for conventional surgical aortic valve replacement. The surgeon indicated the patient would be a poor candidate for conventional surgery. Based upon review of all of the patient's preoperative diagnostic tests they are felt to be candidate for transcatheter aortic valve replacement using the transfemoral approach as an alternative to high risk conventional surgery.    Following the decision to proceed with transcatheter aortic valve replacement, a discussion has been held regarding what types of management strategies would be attempted intraoperatively in the event of life-threatening complications, including whether or not the patient would be considered a candidate for the use of cardiopulmonary bypass and/or conversion to open sternotomy for attempted surgical intervention.  The patient has been advised of a variety of complications that might develop peculiar to this approach including but not limited to risks of death, stroke, paravalvular leak, aortic dissection or other major vascular complications, aortic annulus rupture, device embolization, cardiac rupture or perforation, acute myocardial infarction, arrhythmia, heart block or bradycardia requiring permanent pacemaker placement, congestive heart failure, respiratory failure, renal failure, pneumonia, infection, other late complications related to structural valve deterioration or migration, or other complications that might ultimately cause a temporary or permanent loss of functional independence or other long term morbidity.  The patient provides full informed consent for the procedure as described and all questions were answered preoperatively.    DETAILS OF THE OPERATIVE PROCEDURE  PREPARATION:   The patient is brought to the operating room on the above mentioned date and central monitoring was established by the anesthesia team including placement of a radial arterial  line. The patient is placed in the supine position on the operating table.  Intravenous antibiotics are administered.  Conscious sedation is used.   Baseline transthoracic echocardiogram was performed. The patient's chest, abdomen, both groins, and both lower extremities are prepared and draped in a sterile manner. A time out procedure is performed.   PERIPHERAL ACCESS:   Using the modified Seldinger technique, femoral arterial and venous access were obtained with placement of 6 Fr sheaths on the left side.  A pigtail diagnostic catheter was passed through the femoral arterial sheath under fluoroscopic guidance into the aortic root.  A temporary transvenous pacemaker catheter was passed through the femoral venous sheath under fluoroscopic guidance into the right ventricle.  The pacemaker was tested to ensure stable lead placement and pacemaker capture. Aortic root angiography was performed in order to determine the optimal angiographic angle for valve deployment.  TRANSFEMORAL ACCESS:  A micropuncture kit was used to gain access to the right femoral artery. Position confirmed with angiography. Pre-closure with double ProGlide closure devices. The patient was heparinized systemically and ACT verified > 250 seconds.  We were unable to deliver the stiff wire due to vessel tortuosity. We then removed the sheath and completed the closure devices. Manual pressure was held.   The left radial was prepped and draped. I then passed a J wire into the aortic root from the left radial artery and passed a pigtail into the root.   The left femoral artery sheath was then removed over a wire and Perclose closure devices were placed. The super stiff wire was then inserted. A 16 Fr transfemoral E-sheath was introduced into the left femoral artery after progressively dilating over an Amplatz superstiff wire. An AL-2 catheter was used to direct a straight-tip exchange length wire across the native aortic valve into the left  ventricle. This was exchanged out for a pigtail catheter and position was confirmed in the LV apex. Simultaneous LV and Ao pressures were recorded.  The pigtail catheter was then exchanged for an Amplatz Extra-stiff wire in the LV apex.   TRANSCATHETER HEART VALVE DEPLOYMENT:  An Edwards Sapien 3 THV (size 29 mm) was prepared and crimped per manufacturer's guidelines, and the proper orientation of the valve is confirmed on the [employer] delivery system. The valve was advanced through the introducer sheath using normal technique until in an appropriate position in the abdominal aorta beyond the sheath tip. The balloon was then retracted and using the fine-tuning wheel was centered on the valve. The valve was then advanced across the aortic arch using appropriate flexion of the catheter. The valve was carefully positioned across the aortic valve annulus. The Commander catheter was retracted using normal technique. Once final position of the valve has been confirmed by angiographic assessment, the valve is deployed while temporarily holding ventilation and during rapid ventricular pacing to maintain systolic blood pressure < 50 mmHg and pulse pressure < 10 mmHg. The balloon inflation is held for >3 seconds after reaching full deployment volume. Once the balloon has fully deflated the balloon is retracted into the ascending aorta and valve function is assessed using TTE. There is felt to be no paravalvular leak and no central aortic insufficiency.  The patient's hemodynamic recovery following valve deployment is good.  The deployment balloon and guidewire are both removed. Echo demostrated acceptable post-procedural gradients, stable mitral valve function, and no AI.   PROCEDURE COMPLETION:  The sheath was then removed and closure devices were completed. Protamine was administered once femoral arterial repair was complete. The temporary pacemaker, pigtail catheters and radial/ femoral sheaths were removed  with a TR Band  for closure on the left radial artery and manual pressures for venous hemostasis.    The patient tolerated the procedure well and is transported to the surgical intensive care in stable condition. There were no immediate intraoperative complications. All sponge instrument and needle counts are verified correct at completion of the operation.   No blood products were administered during the operation.  The patient received a total of 60 mL of intravenous contrast during the procedure.  Lauree Chandler MD 07/05/2019 10:18 AM

## 2019-07-05 NOTE — Progress Notes (Signed)
  Echocardiogram 2D Echocardiogram limited has been performed.  Darlina Sicilian M 07/05/2019, 10:02 AM

## 2019-07-05 NOTE — Anesthesia Procedure Notes (Signed)
Arterial Line Insertion Start/End5/06/2019 7:05 AM, 07/05/2019 7:10 AM Performed by: CRNA  Patient location: Pre-op. Preanesthetic checklist: patient identified, IV checked, site marked, risks and benefits discussed, surgical consent, monitors and equipment checked, pre-op evaluation, timeout performed and anesthesia consent Lidocaine 1% used for infiltration Right, radial was placed Catheter size: 20 G Hand hygiene performed  and maximum sterile barriers used   Attempts: 1 Procedure performed without using ultrasound guided technique. Following insertion, dressing applied and Biopatch. Post procedure assessment: normal  Patient tolerated the procedure well with no immediate complications.

## 2019-07-05 NOTE — Transfer of Care (Signed)
Immediate Anesthesia Transfer of Care Note  Patient: [name] [name]  Procedure(s) Performed: TRANSCATHETER AORTIC VALVE REPLACEMENT, TRANSFEMORAL (N/A Chest) TRANSESOPHAGEAL ECHOCARDIOGRAM (TEE) (N/A )  Patient Location: Cath Lab  Anesthesia Type:MAC  Level of Consciousness: awake, alert  and oriented  Airway & Oxygen Therapy: Patient Spontanous Breathing and Patient connected to nasal cannula oxygen  Post-op Assessment: Report given to RN, Post -op Vital signs reviewed and stable and Patient moving all extremities X 4  Post vital signs: Reviewed and stable  Last Vitals:  Vitals Value Taken Time  BP 100/58 07/05/19 1037  Temp 36.6 C 07/05/19 1032  Pulse 58 07/05/19 1038  Resp 18 07/05/19 1038  SpO2 99 % 07/05/19 1038  Vitals shown include unvalidated device data.  Last Pain:  Vitals:   07/05/19 1032  TempSrc: Temporal  PainSc:          Complications: No apparent anesthesia complications

## 2019-07-05 NOTE — Anesthesia Procedure Notes (Signed)
Procedure Name: MAC Date/Time: 07/05/2019 7:28 AM Performed by: Mariea Clonts, CRNA Pre-anesthesia Checklist: Patient identified, Emergency Drugs available, Suction available, Patient being monitored and Timeout performed Patient Re-evaluated:Patient Re-evaluated prior to induction Oxygen Delivery Method: Simple face mask and Nasal cannula

## 2019-07-05 NOTE — Progress Notes (Signed)
Patient ID: [name] [name], male   DOB: November 29, 1931, 86 y.o.   MRN: [id number] TCTS:  BP 115/57  P100 ST.  Feels fine, just got back from walking. No pain or shortness of breath.  Right groin has some ecchymosis but no palpable hematoma. Nontender and soft. Left groin looks fine. Left radial site looks good. TR band still in place.

## 2019-07-05 NOTE — Discharge Instructions (Signed)
ACTIVITY AND EXERCISE °• Daily activity and exercise are an important part of your recovery. People recover at different rates depending on their general health and type of valve procedure. °• Most people recovering from TAVR feel better relatively quickly  °• No lifting, pushing, pulling more than 10 pounds (examples to avoid: groceries, vacuuming, gardening, golfing): °            - For one week with a procedure through the groin. °            - For six weeks for procedures through the chest wall or neck. °NOTE: You will typically see one of our providers 7-14 days after your procedure to discuss WHEN TO RESUME the above activities.  °  °  °DRIVING °• Do not drive until you are seen for follow up and cleared by a provider. Generally, we ask patient to not drive for 1 week after their procedure. °• If you have been told by your doctor in the past that you may not drive, you must talk with him/her before you begin driving again. °  °DRESSING °• Groin site: you may leave the clear dressing over the site for up to one week or until it falls off. °  °HYGIENE °• If you had a femoral (leg) procedure, you may take a shower when you return home. After the shower, pat the site dry. Do NOT use powder, oils or lotions in your groin area until the site has completely healed. °• If you had a chest procedure, you may shower when you return home unless specifically instructed not to by your discharging practitioner. °            - DO NOT scrub incision; pat dry with a towel. °            - DO NOT apply any lotions, oils, powders to the incision. °            - No tub baths / swimming for at least 2 weeks. °• If you notice any fevers, chills, increased pain, swelling, bleeding or pus, please contact your doctor. °  °ADDITIONAL INFORMATION °• If you are going to have an upcoming dental procedure, please contact our office as you will require antibiotics ahead of time to prevent infection on your heart valve.  ° ° °If you have any  questions or concerns you can call the structural heart phone during normal business hours 8am-4pm. If you have an urgent need after hours or weekends please call [phone] to talk to the on call provider for general cardiology. If you have an emergency that requires immediate attention, please call 911.  ° ° °After TAVR Checklist ° °Check  Test Description  ° Follow up appointment in 1-2 weeks  You will see our structural heart physician assistant, Katie Victormanuel Mclure. Your incision sites will be checked and you will be cleared to drive and resume all normal activities if you are doing well.    ° 1 month echo and follow up  You will have an echo to check on your new heart valve and be seen back in the office by Katie Stacye Noori. Many times the echo is not read by your appointment time, but Katie will call you later that day or the following day to report your results.  ° Follow up with your primary cardiologist You will need to be seen by your primary cardiologist in the following 3-6 months after your 1 month appointment in the valve   clinic. Often times your Plavix or Aspirin will be discontinued during this time, but this is decided on a case by case basis.   ° 1 year echo and follow up You will have another echo to check on your heart valve after 1 year and be seen back in the office by Katie Shakaria Raphael. This your last structural heart visit.  ° Bacterial endocarditis prophylaxis  You will have to take antibiotics for the rest of your life before all dental procedures (even teeth cleanings) to protect your heart valve. Antibiotics are also required before some surgeries. Please check with your cardiologist before scheduling any surgeries. Also, please make sure to tell us if you have a penicillin allergy as you will require an alternative antibiotic.   ° ° °

## 2019-07-05 NOTE — Progress Notes (Signed)
Pt BP 71/43 (53), pt with no chest pain, lightheadedness or dizziness. Bilateral groins level 0, TR band level 0 with 10 cc air. Nell Range, PA paged verbal order received for 500 ml bolus. PA to come to the bedside. Will continue to monitor.  Amanda Cockayne, RN

## 2019-07-05 NOTE — Anesthesia Postprocedure Evaluation (Signed)
Anesthesia Post Note  Patient: REVIS WHALIN  Procedure(s) Performed: TRANSCATHETER AORTIC VALVE REPLACEMENT, TRANSFEMORAL (N/A Chest) INTRAOPERATIVE TRANSTHORACIC ECHOCARDIOGRAM (N/A )     Patient location during evaluation: Cath Lab Anesthesia Type: MAC Level of consciousness: awake and alert, oriented and patient cooperative Pain management: pain level controlled Vital Signs Assessment: post-procedure vital signs reviewed and stable Respiratory status: spontaneous breathing, nonlabored ventilation, respiratory function stable and patient connected to nasal cannula oxygen Cardiovascular status: blood pressure returned to baseline and stable Postop Assessment: no apparent nausea or vomiting Anesthetic complications: no    Last Vitals:  Vitals:   07/05/19 1701 07/05/19 1717  BP: 111/60 (!) 115/57  Pulse: 99 (!) 102  Resp: 19 20  Temp:    SpO2: 95% 98%    Last Pain:  Vitals:   07/05/19 1600  TempSrc: Oral  PainSc:                  Ligaya Cormier,E. Tia Gelb

## 2019-07-05 NOTE — OR Nursing (Signed)
Bilateral  Pedal pulses noted by Ardyth Man, RNFA preop.

## 2019-07-05 NOTE — Progress Notes (Signed)
Arterial line removed from right radial artery by  Jacqualine Mau, RN. Manual pressure held for 10 min. Tegaderm and gauze dressing applied.

## 2019-07-05 NOTE — Interval H&P Note (Signed)
History and Physical Interval Note:  07/05/2019 7:00 AM  [name] [name]  has presented today for surgery, with the diagnosis of Severe Aortic Stenosis.  The various methods of treatment have been discussed with the patient and family. After consideration of risks, benefits and other options for treatment, the patient has consented to  Procedure(s): TRANSCATHETER AORTIC VALVE REPLACEMENT, TRANSFEMORAL (N/A) TRANSESOPHAGEAL ECHOCARDIOGRAM (TEE) (N/A) as a surgical intervention.  The patient's history has been reviewed, patient examined, no change in status, stable for surgery.  I have reviewed the patient's chart and labs.  Questions were answered to the patient's satisfaction.     Gaye Pollack

## 2019-07-06 ENCOUNTER — Inpatient Hospital Stay (HOSPITAL_COMMUNITY): Payer: Medicare Other

## 2019-07-06 DIAGNOSIS — I350 Nonrheumatic aortic (valve) stenosis: Secondary | ICD-10-CM | POA: Diagnosis not present

## 2019-07-06 DIAGNOSIS — Z952 Presence of prosthetic heart valve: Secondary | ICD-10-CM | POA: Diagnosis not present

## 2019-07-06 DIAGNOSIS — Z954 Presence of other heart-valve replacement: Secondary | ICD-10-CM | POA: Diagnosis not present

## 2019-07-06 LAB — BASIC METABOLIC PANEL
Anion gap: 10 (ref 5–15)
BUN: 14 mg/dL (ref 8–23)
CO2: 24 mmol/L (ref 22–32)
Calcium: 8.6 mg/dL — ABNORMAL LOW (ref 8.9–10.3)
Chloride: 107 mmol/L (ref 98–111)
Creatinine, Ser: 0.9 mg/dL (ref 0.61–1.24)
GFR calc Af Amer: >60 mL/min (ref >60)
GFR calc non Af Amer: >60 mL/min (ref >60)
Glucose, Bld: 112 mg/dL — ABNORMAL HIGH (ref 70–99)
Potassium: 3.6 mmol/L (ref 3.5–5.1)
Sodium: 141 mmol/L (ref 135–145)

## 2019-07-06 LAB — ECHOCARDIOGRAM COMPLETE
Height: 70 in
Weight: 3457.6 oz

## 2019-07-06 LAB — CBC
HCT: 31.3 % — ABNORMAL LOW (ref 39.0–52.0)
Hemoglobin: 10.7 g/dL — ABNORMAL LOW (ref 13.0–17.0)
MCH: 31.9 pg (ref 26.0–34.0)
MCHC: 34.2 g/dL (ref 30.0–36.0)
MCV: 93.4 fL (ref 80.0–100.0)
Platelets: 117 10*3/uL — ABNORMAL LOW (ref 150–400)
RBC: 3.35 MIL/uL — ABNORMAL LOW (ref 4.22–5.81)
RDW: 12.4 % (ref 11.5–15.5)
WBC: 7.3 10*3/uL (ref 4.0–10.5)
nRBC: 0 % (ref 0.0–0.2)

## 2019-07-06 LAB — MAGNESIUM: Magnesium: 1.8 mg/dL (ref 1.7–2.4)

## 2019-07-06 MED ORDER — CLOPIDOGREL BISULFATE 75 MG PO TABS: 75.0000 mg | ORAL_TABLET | Freq: Every day | ORAL | 1 refills | Status: DC

## 2019-07-06 NOTE — Progress Notes (Signed)
Discharge instructions given to [name] [name].  Discussed new medications, medication changes and side effects.  Discussed signs and symptoms to watch for and when to contact the physician.  Discussed activities and follow up appointments.  Verbalized understanding.

## 2019-07-06 NOTE — Progress Notes (Signed)
  Echocardiogram 2D Echocardiogram has been performed.  Zunairah Devers G Bernice Mcauliffe 07/06/2019, 9:17 AM

## 2019-07-06 NOTE — Discharge Summary (Signed)
[place] VALVE TEAM  Discharge Summary    Patient ID: [name] MRN: [id number]; DOB: [birth year]  Admit date: 07/05/2019 Discharge date: 07/06/2019  Primary Care Provider: Haywood Pao, MD  Primary Cardiologist: Dr. Virgina Jock / Dr. Buena Irish & Dr. Cyndia Bent (TAVR)  Discharge Diagnoses    Principal Problem:   S/P TAVR (transcatheter aortic valve replacement) Active Problems:   Severe aortic stenosis   Carotid stenosis, asymptomatic, bilateral   Essential hypertension   Hypertension   High cholesterol   Allergies No Known Allergies  Diagnostic Studies/Procedures    TAVR OPERATIVE NOTE   Date of Procedure:                07/05/2019  Preoperative Diagnosis:      Severe Aortic Stenosis   Postoperative Diagnosis:    Same   Procedure:        Transcatheter Aortic Valve Replacement - Percutaneous Left Transfemoral Approach             Edwards Sapien 3 THV (size 29 mm, model # 9600TFX, serial # [id number])              Co-Surgeons:                        Gaye Pollack, MD and Lauree Chandler, MD   Anesthesiologist:                  Annye Asa, MD  Echocardiographer:              Ena Dawley, MD  Pre-operative Echo Findings: ? Severe aortic stenosis ? Normal left ventricular systolic function  Post-operative Echo Findings: ? No paravalvular leak ? Normal left ventricular systolic function   _____________   Echo 07/06/19: complete but pending formal read at the time of discharge    History of Present Illness     [name] [name] is a 86 y.o. male with a history of carotid artery disease (0000000 LICA stenosis), HTN, HLD, mild MR/mod TR and severe aortic stenosis who presented to [facility] on 07/05/19 for planned TAVR.  An echocardiogram in July 2020 showed moderate to severe aortic stenosis with a mean gradient of 28.4 mmHg and a valve area of 0.93 cm. His most recent echocardiogram on 04/27/2019  showed an increase in the mean gradient to 40.3 mmHg with a valve area of 0.8 cm and dimensionless index of 0.18. Left ventricular systolic function was normal.  The patient has remained very active over the years doing ballroom dancing, playing golf, and going to the gym until the COVID-19 pandemic. Over the past several weeks he said that he has noted dyspnea on exertion and fatigue. This occurred while playing golf and he had never had it before. He underwent cardiac catheterization on 06/03/2019 which showed a focal 40% stenosis in the mid LAD that was nonobstructive. There was no other significant coronary disease.  The patient has been evaluated by the multidisciplinary valve team and felt to have severe, symptomatic aortic stenosis and to be a suitable candidate for TAVR, which was set up for 07/05/19.    Hospital Course     Consultants: none  Severe AS: s/p successful TAVR with a 29 mm Edwards Sapien 3 THV via the TF approach on 07/05/19. Post operative echo pending. Groin sites are stable; he has significant ecchymosis on right groin and into his scrotum but no hematoma. ECG with sinus tachy with 1st  deg AV block, but no high grade heart block. Continued on home Asprin and started on plavix 75 mg daily. I will see him for close follow up in the office next week  HTN: BP was quite low initially post operatively. This improved with IVFs. BP still soft, will hold home lasix 20mg  daily at discharge. We can add back next week if BP improves.   Sinus tachycardia: HR noted to be in low 100s at rest and up to 120s with walking. Noted to be in low 100s prior to TAVR at cardiac CT. Review of other notes show HRs usually in 70s. Has some mild worsening anemia but no s/s infection or PE. Not on a BB and BP likely too low to add one. Will continue to monitor at this time.   Carotid artery disease: continue medical therapy. Followed by Dr. Virgina Jock  HLD: continue statin   _____________  Discharge  Vitals Blood pressure 99/61, pulse 93, temperature 98.7 F (37.1 C), temperature source Oral, resp. rate 16, height 5\' 10"  (1.778 m), weight 98 kg, SpO2 100 %.  Filed Weights   07/05/19 0609 07/06/19 0449  Weight: 96.7 kg 98 kg    Labs & Radiologic Studies    CBC Recent Labs    07/05/19 1042 07/06/19 0346  WBC  --  7.3  HGB 10.5* 10.7*  HCT 31.0* 31.3*  MCV  --  93.4  PLT  --  123XX123*   Basic Metabolic Panel Recent Labs    07/05/19 1042 07/06/19 0346  NA 140 141  K 4.5 3.6  CL 106 107  CO2  --  24  GLUCOSE 143* 112*  BUN 15 14  CREATININE 0.90 0.90  CALCIUM  --  8.6*  MG  --  1.8   Liver Function Tests No results for input(s): AST, ALT, ALKPHOS, BILITOT, PROT, ALBUMIN in the last 72 hours. No results for input(s): LIPASE, AMYLASE in the last 72 hours. Cardiac Enzymes No results for input(s): CKTOTAL, CKMB, CKMBINDEX, TROPONINI in the last 72 hours. BNP Invalid input(s): POCBNP D-Dimer No results for input(s): DDIMER in the last 72 hours. Hemoglobin A1C No results for input(s): HGBA1C in the last 72 hours. Fasting Lipid Panel No results for input(s): CHOL, HDL, LDLCALC, TRIG, CHOLHDL, LDLDIRECT in the last 72 hours. Thyroid Function Tests No results for input(s): TSH, T4TOTAL, T3FREE, THYROIDAB in the last 72 hours.  Invalid input(s): FREET3 _____________  DG Chest 2 View  Result Date: 07/01/2019 CLINICAL DATA:  Aortic valve disease.  Hypertension. EXAM: CHEST - 2 VIEW COMPARISON:  February 03, 2017 FINDINGS: There is minimal scarring in the left base. Lungs elsewhere clear. Heart size and pulmonary vascularity are normal. No adenopathy. There is aortic atherosclerosis. No bone lesions. IMPRESSION: Lungs clear except for minimal left base scarring. Cardiac silhouette within normal limits. Aortic Atherosclerosis (ICD10-I70.0). Electronically Signed   By: Lowella Grip III M.D.   On: 07/01/2019 10:28   CARDIAC CATHETERIZATION  Result Date: 06/21/2019 LM:  Normal LAD: Medial calcification seen in prox LAD without significant stenosis         Mid LAD focal 40% stenosis LCX: Large, dominant.        Medial calcification seen in prox LCX without significant stenosis RCA: Small, nondominant. Normal. Normal right heart cath  CT CORONARY MORPH W/CTA COR W/SCORE W/CA W/CM &/OR WO/CM  Addendum Date: 06/28/2019   ADDENDUM REPORT: 06/28/2019 21:30 CLINICAL DATA:  41M with severe aortic stenosis being evaluated for a TAVR procedure. EXAM: Cardiac  TAVR CT TECHNIQUE: The patient was scanned on a [employer]. A 120 kV retrospective scan was triggered in the descending thoracic aorta at 111 HU's. Gantry rotation speed was 250 msecs and collimation was .6 mm. No beta blockade or nitro were given. The 3D data set was reconstructed in 5% intervals of the R-R cycle. Systolic and diastolic phases were analyzed on a dedicated work station using MPR, MIP and VRT modes. The patient received 80 cc of contrast. FINDINGS: Aortic Root: Aortic valve: Trileaflet Aortic valve calcium score: 5364 Aortic annulus: Diameter: 32 mm x 26 mm Perimeter: 84mm Area: 612 mm^2 Calcifications: No calcifications Coronary height: Min Left - 64mm, Max Left - 77mm; Min Right - 71mm Sinotubular height: Left cusp - 78mm; Right cusp - 36mm; Noncoronary cusp - 45mm LVOT (as measured 3 mm below the annulus): Diameter: 89mm x 68mm Area: 610 cm^2 Calcifications: No calcifications Aortic sinus width: Left cusp - 33mm; Right cusp - 13mm; Noncoronary cusp - 70mm Sinotubular junction width: 91mm x 69mm Optimum Fluoroscopic Angle for Delivery: LAO 14 CAU 8 Cardiac: Right atrium: Moderate enlargement Right ventricle: Moderate dilatation Pulmonary arteries: Normal size Pulmonary veins: Normal configuration Left atrium: Mild enlargement Left ventricle: Normal size Pericardium: Normal thickness Coronary arteries: Calcium score 1356 IMPRESSION: 1. Trileaflet aortic valve, severely calcified (calcium score 5364) 2.  Aortic annulus measures 40mm x 1mm with perimeter 72mm and area 612 mm^2. No annular or LVOT calcifications. Annulus measurements suitable for delivery of 34mm Edwards-Sapien 3 valve 3. Sufficient coronary to annulus distance, measuring 32mm to left main and 85mm to RCA 4. Optimum Fluoroscopic Angle for Delivery: LAO 14 CAU 8 5. Calcium score is 1356 (70th percentile) Electronically Signed   By: Oswaldo Milian MD   On: 06/28/2019 21:30   Result Date: 06/28/2019 EXAM: OVER-READ INTERPRETATION  CT CHEST The following report is an over-read performed by radiologist Dr. Vinnie Langton of [facility], [place] on 06/28/2019. This over-read does not include interpretation of cardiac or coronary anatomy or pathology. The coronary calcium score/coronary CTA interpretation by the cardiologist is attached. COMPARISON:  None. FINDINGS: Extracardiac findings will be described separately under dictation for contemporaneously obtained CTA chest, abdomen and pelvis. IMPRESSION: Please see separate dictation for contemporaneously obtained CTA chest, abdomen and pelvis dated 06/28/2019 for full description of relevant extracardiac findings. Electronically Signed: By: Vinnie Langton M.D. On: 06/28/2019 12:27   DG Chest Port 1 View  Result Date: 07/05/2019 CLINICAL DATA:  Chest pain. Post TAVR today. EXAM: PORTABLE CHEST 1 VIEW COMPARISON:  Radiographs 07/01/2019 and 02/03/2017. FINDINGS: 1242 hours. Interval postsurgical changes consistent with TAVR. The heart size and mediastinal contours are stable. There is aortic atherosclerosis. The lungs are clear. There is no pleural effusion or pneumothorax. The bones appear unchanged. IMPRESSION: No active cardiopulmonary process status post TAVR. Electronically Signed   By: Richardean Sale M.D.   On: 07/05/2019 12:51   CT ANGIO CHEST AORTA W/CM & OR WO/CM  Result Date: 06/28/2019 CLINICAL DATA:  86 year old male with history of severe aortic stenosis. Preprocedural  study prior to potential transcatheter aortic valve replacement (TAVR) procedure. EXAM: CT ANGIOGRAPHY CHEST, ABDOMEN AND PELVIS TECHNIQUE: Non-contrast CT of the chest was initially obtained. Multidetector CT imaging through the chest, abdomen and pelvis was performed using the standard protocol during bolus administration of intravenous contrast. Multiplanar reconstructed images and MIPs were obtained and reviewed to evaluate the vascular anatomy. CONTRAST:  11mL OMNIPAQUE IOHEXOL 350 MG/ML SOLN COMPARISON:  No priors. FINDINGS: CTA CHEST  FINDINGS Cardiovascular: Heart size is mildly enlarged. There is no significant pericardial fluid, thickening or pericardial calcification. There is aortic atherosclerosis, as well as atherosclerosis of the great vessels of the mediastinum and the coronary arteries, including calcified atherosclerotic plaque in the left main, left anterior descending, left circumflex and right coronary arteries. Severe thickening calcification of the aortic valve. Mild calcifications of the mitral annulus. Mediastinum/Lymph Nodes: No pathologically enlarged mediastinal or hilar lymph nodes. Esophagus is unremarkable in appearance. No axillary lymphadenopathy. Lungs/Pleura: No suspicious appearing pulmonary nodules or masses are noted. No acute consolidative airspace disease. No pleural effusions. Musculoskeletal/Soft Tissues: No suspicious cystic or solid hepatic lesions. No intra or extrahepatic biliary ductal dilatation. Gallbladder is normal in appearance. CTA ABDOMEN AND PELVIS FINDINGS Hepatobiliary: No suspicious cystic or solid hepatic lesions. No intra or extrahepatic biliary ductal dilatation. Gallbladder is normal in appearance. Pancreas: No pancreatic mass. No pancreatic ductal dilatation. No pancreatic or peripancreatic fluid collections or inflammatory changes. Spleen: Unremarkable. Adrenals/Urinary Tract: Low-attenuation lesions are noted in the kidneys bilaterally, compatible with  simple cysts, largest of which is exophytic in the lower pole of the left kidney measuring 5.5 cm in diameter. Multiple subcentimeter low-attenuation lesions in both kidneys, too small to definitively characterize, but statistically likely to represent tiny cysts. Bilateral adrenal glands are normal in appearance. No hydroureteronephrosis. Urinary bladder is normal in appearance. Stomach/Bowel: Normal appearance of the stomach. No pathologic dilatation of small bowel or colon. Normal appendix. Vascular/Lymphatic: Aortic atherosclerosis, with fusiform ectasia of the infrarenal abdominal aorta which measures up to 2.8 x 2.7 cm in diameter. Vascular findings and measurements pertinent to potential TAVR procedure, as detailed below. No lymphadenopathy noted in the abdomen or pelvis. Reproductive: Prostate gland and seminal vesicles are unremarkable in appearance. Other: No significant volume of ascites.  No pneumoperitoneum. Musculoskeletal: There are no aggressive appearing lytic or blastic lesions noted in the visualized portions of the skeleton. VASCULAR MEASUREMENTS PERTINENT TO TAVR: AORTA: Minimal Aortic Diameter-18 x 17 mm Severity of Aortic Calcification-moderate to severe RIGHT PELVIS: Right Common Iliac Artery - Minimal Diameter-12.0 x 10.5 mm Tortuosity-mild Calcification-moderate Right External Iliac Artery - Minimal Diameter-10.6 x 9.3 mm Tortuosity-extreme Calcification-mild Right Common Femoral Artery - Minimal Diameter-11.2 x 11.4 mm Tortuosity-mild Calcification-mild LEFT PELVIS: Left Common Iliac Artery - Minimal Diameter-7.4 x 7.5 mm Tortuosity-mild Calcification-moderate Left External Iliac Artery - Minimal Diameter-10.3 x 8.4 mm Tortuosity-extreme Calcification-mild Left Common Femoral Artery - Minimal Diameter-11.4 x 11.1 mm Tortuosity-mild Calcification-none Review of the MIP images confirms the above findings. IMPRESSION: 1. Vascular findings and measurements pertinent to potential TAVR  procedure, as detailed above. 2. Severe thickening calcification of the aortic valve, compatible with the reported clinical history of severe aortic stenosis. 3. Mild cardiomegaly. 4. Left main and 3 vessel coronary artery disease. 5. Additional incidental findings, as above. Electronically Signed   By: Vinnie Langton M.D.   On: 06/28/2019 13:27   ECHOCARDIOGRAM LIMITED  Result Date: 07/05/2019    ECHOCARDIOGRAM LIMITED REPORT   Patient Name:   [name] [name] Date of Exam: 07/05/2019 Medical Rec #:  [id number]      Height:       70.0 in Accession #:    [id number]     Weight:       213.1 lb Date of Birth:  16-Nov-1931     BSA:          2.144 m Patient Age:    86 years       BP:  122/62 mmHg Patient Gender: M              HR:           74 bpm. Exam Location:  Inpatient Procedure: Limited Echo, Limited Color Doppler and Cardiac Doppler Indications:     Aortic Stenosis 424.1 / 135.0  History:         Patient has prior history of Echocardiogram examinations, most                  recent 05/25/2019. Risk Factors:Hypertension and Dyslipidemia.                  Carotid artery occlusion.  Sonographer:     Darlina Sicilian RDCS Referring Phys:  [place] Diagnosing Phys: Ena Dawley MD IMPRESSIONS  1. This was a periprocedural TTE during a TAVR procedure. A 29 mm Edwards-SAPIEN 3 valve was successfully placed in the aortic position. Peak/mean transaortic gradients have decreased from 55/36 mmHg to 4/2 mmHg with no paravalvular leak. Mitral regurgitation remained mild. There was no pericardial effusion prior or post procedure.  2. Left ventricular ejection fraction, by estimation, is 55 to 60%. The left ventricle has normal function. Left ventricular diastolic function could not be evaluated.  3. Left atrial size was mildly dilated.  4. The mitral valve is grossly normal. Mild mitral valve regurgitation.  5. Tricuspid valve regurgitation is moderate.  6. Aortic valve regurgitation is not  visualized. Severe aortic valve stenosis.  7. There is normal pulmonary artery systolic pressure. The estimated right ventricular systolic pressure is 123456 mmHg. FINDINGS  Left Ventricle: Left ventricular ejection fraction, by estimation, is 55 to 60%. The left ventricle has normal function. Right Ventricle: There is normal pulmonary artery systolic pressure. The tricuspid regurgitant velocity is 2.64 m/s, and with an assumed right atrial pressure of 3 mmHg, the estimated right ventricular systolic pressure is 123456 mmHg. Left Atrium: Left atrial size was mildly dilated. Mitral Valve: The mitral valve is grossly normal. There is mild thickening of the mitral valve leaflet(s). Mild mitral annular calcification. Mild mitral valve regurgitation. Tricuspid Valve: Tricuspid valve regurgitation is moderate. Aortic Valve: Aortic valve regurgitation is not visualized. Severe aortic stenosis is present. There is severe thickening of the aortic valve. There is severe calcifcation of the aortic valve. Aortic valve mean gradient measures 2.0 mmHg. Aortic valve peak gradient measures 4.8 mmHg. Aortic valve area, by VTI measures 3.44 cm. Pulmonic Valve: Pulmonic valve regurgitation is mild.  LEFT VENTRICLE PLAX 2D LVOT diam:     2.60 cm LV SV:         90 LV SV Index:   42 LVOT Area:     5.31 cm  AORTIC VALVE AV Area (Vmax):    4.78 cm AV Area (Vmean):   4.35 cm AV Area (VTI):     3.44 cm AV Vmax:           110.00 cm/s AV Vmean:          69.500 cm/s AV VTI:            0.261 m AV Peak Grad:      4.8 mmHg AV Mean Grad:      2.0 mmHg LVOT Vmax:         99.00 cm/s LVOT Vmean:        56.900 cm/s LVOT VTI:          0.169 m LVOT/AV VTI ratio: 0.65 TRICUSPID VALVE TR Peak grad:  27.9 mmHg TR Vmax:        264.00 cm/s  SHUNTS Systemic VTI:  0.17 m Systemic Diam: 2.60 cm Ena Dawley MD Electronically signed by Ena Dawley MD Signature Date/Time: 07/05/2019/10:23:13 AM    Final    Structural Heart Procedure  Addendum Date:  07/06/2019   See surgical note for result.  Result Date: 07/06/2019 See surgical note for result.  CT Angio Abd/Pel w/ and/or w/o  Result Date: 06/28/2019 CLINICAL DATA:  86 year old male with history of severe aortic stenosis. Preprocedural study prior to potential transcatheter aortic valve replacement (TAVR) procedure. EXAM: CT ANGIOGRAPHY CHEST, ABDOMEN AND PELVIS TECHNIQUE: Non-contrast CT of the chest was initially obtained. Multidetector CT imaging through the chest, abdomen and pelvis was performed using the standard protocol during bolus administration of intravenous contrast. Multiplanar reconstructed images and MIPs were obtained and reviewed to evaluate the vascular anatomy. CONTRAST:  175mL OMNIPAQUE IOHEXOL 350 MG/ML SOLN COMPARISON:  No priors. FINDINGS: CTA CHEST FINDINGS Cardiovascular: Heart size is mildly enlarged. There is no significant pericardial fluid, thickening or pericardial calcification. There is aortic atherosclerosis, as well as atherosclerosis of the great vessels of the mediastinum and the coronary arteries, including calcified atherosclerotic plaque in the left main, left anterior descending, left circumflex and right coronary arteries. Severe thickening calcification of the aortic valve. Mild calcifications of the mitral annulus. Mediastinum/Lymph Nodes: No pathologically enlarged mediastinal or hilar lymph nodes. Esophagus is unremarkable in appearance. No axillary lymphadenopathy. Lungs/Pleura: No suspicious appearing pulmonary nodules or masses are noted. No acute consolidative airspace disease. No pleural effusions. Musculoskeletal/Soft Tissues: No suspicious cystic or solid hepatic lesions. No intra or extrahepatic biliary ductal dilatation. Gallbladder is normal in appearance. CTA ABDOMEN AND PELVIS FINDINGS Hepatobiliary: No suspicious cystic or solid hepatic lesions. No intra or extrahepatic biliary ductal dilatation. Gallbladder is normal in appearance. Pancreas: No  pancreatic mass. No pancreatic ductal dilatation. No pancreatic or peripancreatic fluid collections or inflammatory changes. Spleen: Unremarkable. Adrenals/Urinary Tract: Low-attenuation lesions are noted in the kidneys bilaterally, compatible with simple cysts, largest of which is exophytic in the lower pole of the left kidney measuring 5.5 cm in diameter. Multiple subcentimeter low-attenuation lesions in both kidneys, too small to definitively characterize, but statistically likely to represent tiny cysts. Bilateral adrenal glands are normal in appearance. No hydroureteronephrosis. Urinary bladder is normal in appearance. Stomach/Bowel: Normal appearance of the stomach. No pathologic dilatation of small bowel or colon. Normal appendix. Vascular/Lymphatic: Aortic atherosclerosis, with fusiform ectasia of the infrarenal abdominal aorta which measures up to 2.8 x 2.7 cm in diameter. Vascular findings and measurements pertinent to potential TAVR procedure, as detailed below. No lymphadenopathy noted in the abdomen or pelvis. Reproductive: Prostate gland and seminal vesicles are unremarkable in appearance. Other: No significant volume of ascites.  No pneumoperitoneum. Musculoskeletal: There are no aggressive appearing lytic or blastic lesions noted in the visualized portions of the skeleton. VASCULAR MEASUREMENTS PERTINENT TO TAVR: AORTA: Minimal Aortic Diameter-18 x 17 mm Severity of Aortic Calcification-moderate to severe RIGHT PELVIS: Right Common Iliac Artery - Minimal Diameter-12.0 x 10.5 mm Tortuosity-mild Calcification-moderate Right External Iliac Artery - Minimal Diameter-10.6 x 9.3 mm Tortuosity-extreme Calcification-mild Right Common Femoral Artery - Minimal Diameter-11.2 x 11.4 mm Tortuosity-mild Calcification-mild LEFT PELVIS: Left Common Iliac Artery - Minimal Diameter-7.4 x 7.5 mm Tortuosity-mild Calcification-moderate Left External Iliac Artery - Minimal Diameter-10.3 x 8.4 mm Tortuosity-extreme  Calcification-mild Left Common Femoral Artery - Minimal Diameter-11.4 x 11.1 mm Tortuosity-mild Calcification-none Review of the MIP images confirms the above findings. IMPRESSION:  1. Vascular findings and measurements pertinent to potential TAVR procedure, as detailed above. 2. Severe thickening calcification of the aortic valve, compatible with the reported clinical history of severe aortic stenosis. 3. Mild cardiomegaly. 4. Left main and 3 vessel coronary artery disease. 5. Additional incidental findings, as above. Electronically Signed   By: Vinnie Langton M.D.   On: 06/28/2019 13:27   Disposition   Pt is being discharged home today in good condition.  Follow-up Plans & Appointments    Follow-up Information    Eileen Stanford, PA-C. Go on 07/13/2019.   Specialties: Cardiology, Radiology Why: @ 1:30pm, please arrive at least 10 minutes early. Contact information: [address] [place] [id number] [phone]            Discharge Medications   Allergies as of 07/06/2019   No Known Allergies     Medication List    STOP taking these medications   furosemide 20 MG tablet Commonly known as: LASIX   ibuprofen 200 MG tablet Commonly known as: ADVIL     TAKE these medications   aspirin EC 81 MG tablet Take 1 tablet (81 mg total) by mouth daily. Notes to patient: For heart health    b complex vitamins tablet Take 1 tablet by mouth daily. Notes to patient: Supplement   Biotin 10000 MCG Tabs Take 10,000 mcg by mouth daily. Notes to patient: Supplement   cetirizine 10 MG tablet Commonly known as: ZYRTEC Take 10 mg by mouth daily. Notes to patient: For allergies    cholecalciferol 25 MCG (1000 UNIT) tablet Commonly known as: VITAMIN D3 Take 3,000 Units by mouth daily. Notes to patient: Supplement   clobetasol ointment 0.05 % Commonly known as: TEMOVATE Apply 1 application topically daily as needed (precancerous spots). Notes to patient:  For skin spots    clopidogrel 75 MG tablet Commonly known as: PLAVIX Take 1 tablet (75 mg total) by mouth daily with breakfast. Start taking on: Jul 07, 2019 Notes to patient: **NEW** To prevent clot in new valve   L-Lysine 500 MG Caps Take 500 mg by mouth See admin instructions. Take 500 mg daily, may take 1000-1500 mg instead when experiencing a cold sore Notes to patient: Supplement   multivitamin with minerals Tabs tablet Take 1 tablet by mouth daily. Notes to patient: Supplement   PROSTATE CONTROL PO Take 1 tablet by mouth daily. Notes to patient: Supplement   Osteo Bi-Flex Adv Triple St Tabs Take 1 tablet by mouth daily. Notes to patient: Supplement   rosuvastatin 40 MG tablet Commonly known as: CRESTOR Take 40 mg by mouth daily. Notes to patient: To lower cholesterol   tadalafil 20 MG tablet Commonly known as: CIALIS Take 40 mg by mouth daily as needed for erectile dysfunction. Notes to patient: For erectile dysfunction   vitamin E 180 MG (400 UNITS) capsule Take 400 Units by mouth daily. Notes to patient: Supplement           Outstanding Labs/Studies   none  Duration of Discharge Encounter   Greater than 30 minutes including physician time.  SignedAngelena Form, PA-C 07/06/2019, 9:18 AM [phone]

## 2019-07-06 NOTE — Progress Notes (Signed)
Discharge instructions provided to patient. All medications, follow up appointments, and discharge instructions provided. IV out. Monitor off CCMD notified. Discharging home with wife.  Paulene Floor, RN

## 2019-07-06 NOTE — Progress Notes (Signed)
1 Day Post-Op Procedure(s) (LRB): TRANSCATHETER AORTIC VALVE REPLACEMENT, TRANSFEMORAL (N/A) INTRAOPERATIVE TRANSTHORACIC ECHOCARDIOGRAM (N/A) Subjective: No complaints. He walked this am without any pain or shortness of breath. No dizziness. Sitting up eating breakfast.  Objective: Vital signs in last 24 hours: Temp:  [96.8 F (36 C)-98.7 F (37.1 C)] 98.7 F (37.1 C) (05/05 0741) Pulse Rate:  [0-295] 93 (05/05 0741) Cardiac Rhythm: Sinus tachycardia (05/05 0400) Resp:  [13-21] 16 (05/05 0741) BP: (69-159)/(38-79) 99/61 (05/05 0741) SpO2:  [90 %-100 %] 100 % (05/05 0741) Weight:  [98 kg] 98 kg (05/05 0449)  Hemodynamic parameters for last 24 hours:    Intake/Output from previous day: 05/04 0701 - 05/05 0700 In: 3823.9 [P.O.:720; I.V.:1603.9; IV Piggyback:1500] Out: 1600 [Urine:1450; Blood:150] Intake/Output this shift: No intake/output data recorded.  General appearance: alert and cooperative Neurologic: intact Heart: regular rate and rhythm, S1, S2 normal, no murmur Lungs: clear to auscultation bilaterally Abdomen: soft, non-tender; bowel sounds normal Extremities: extremities normal, atraumatic, no cyanosis or edema Wound: right groin and scrotum ecchymotic but soft and no palpable hematoma. left groin ok. left radial site ok  Lab Results: Recent Labs    07/05/19 1004 07/05/19 1042  HGB 10.2* 10.5*  HCT 30.0* 31.0*   BMET:  Recent Labs    07/05/19 1042 07/06/19 0346  NA 140 141  K 4.5 3.6  CL 106 107  CO2  --  24  GLUCOSE 143* 112*  BUN 15 14  CREATININE 0.90 0.90  CALCIUM  --  8.6*    PT/INR: No results for input(s): LABPROT, INR in the last 72 hours. ABG    Component Value Date/Time   PHART 7.417 07/01/2019 0911   HCO3 25.4 07/01/2019 0911   TCO2 24 07/05/2019 1042   O2SAT 98.2 07/01/2019 0911   CBG (last 3)  No results for input(s): GLUCAP in the last 72 hours.   ECG: sinus tachy 106, no acute changes.  Assessment/Plan: S/P Procedure(s)  (LRB): TRANSCATHETER AORTIC VALVE REPLACEMENT, TRANSFEMORAL (N/A) INTRAOPERATIVE TRANSTHORACIC ECHOCARDIOGRAM (N/A)  Hemodynamically stable overnight. He is sinus tachy 120 sitting up in chair eating but has been low 100's overnight. preop ECG showed sinus 100. His CBC is pending this am and anemia may be the cause of his tachycardia. He feels fine though.  Plan 2D echo this am  Continue ambulation this am and plan home later today if nothing changes.   LOS: 1 day    Gaye Pollack 07/06/2019

## 2019-07-06 NOTE — Progress Notes (Signed)
CARDIAC REHAB PHASE I   Pt seen ambulating with mobility tech. HR noted to be 105 ST. Pt states improvement in breathing compared to preop. Pt educated on site care and restrictions. Encouraged continued ambulation with emphasis on safety and gradual increase in activity. Will refer to CRP II [place]. Pt is interested in participating in Virtual Cardiac and Pulmonary Rehab. Pt advised that Virtual Cardiac and Pulmonary Rehab is provided at no cost to the patient.  Checklist:  1. Pt has smart device  ie smartphone and/or ipad for downloading an app  Yes 2. Reliable internet/wifi service    Yes 3. Understands how to use their smartphone and navigate within an app.  Yes  Pt verbalized understanding and is in agreement.  [id number] Rufina Falco, RN BSN 07/06/2019 10:05 AM

## 2019-07-06 NOTE — Plan of Care (Signed)
  Problem: Education: Goal: Knowledge of General Education information will improve Description: Including pain rating scale, medication(s)/side effects and non-pharmacologic comfort measures 07/06/2019 1008 by Glenard Haring, RN Outcome: Adequate for Discharge 07/06/2019 1007 by Glenard Haring, RN Outcome: Adequate for Discharge   Problem: Clinical Measurements: Goal: Ability to maintain clinical measurements within normal limits will improve 07/06/2019 1008 by Glenard Haring, RN Outcome: Adequate for Discharge 07/06/2019 1007 by Glenard Haring, RN Outcome: Adequate for Discharge Goal: Will remain free from infection 07/06/2019 1008 by Glenard Haring, RN Outcome: Adequate for Discharge 07/06/2019 1007 by Glenard Haring, RN Outcome: Adequate for Discharge Goal: Diagnostic test results will improve 07/06/2019 1008 by Glenard Haring, RN Outcome: Adequate for Discharge 07/06/2019 1007 by Glenard Haring, RN Outcome: Adequate for Discharge Goal: Respiratory complications will improve 07/06/2019 1008 by Glenard Haring, RN Outcome: Adequate for Discharge 07/06/2019 1007 by Glenard Haring, RN Outcome: Adequate for Discharge Goal: Cardiovascular complication will be avoided 07/06/2019 1008 by Glenard Haring, RN Outcome: Adequate for Discharge 07/06/2019 1007 by Glenard Haring, RN Outcome: Adequate for Discharge   Problem: Activity: Goal: Risk for activity intolerance will decrease 07/06/2019 1008 by Glenard Haring, RN Outcome: Adequate for Discharge 07/06/2019 1007 by Glenard Haring, RN Outcome: Adequate for Discharge   Problem: Nutrition: Goal: Adequate nutrition will be maintained 07/06/2019 1008 by Glenard Haring, RN Outcome: Adequate for Discharge 07/06/2019 1007 by Glenard Haring, RN Outcome: Adequate for Discharge   Problem: Coping: Goal: Level of anxiety will decrease 07/06/2019 1008 by Glenard Haring, RN Outcome: Adequate for Discharge 07/06/2019  1007 by Glenard Haring, RN Outcome: Adequate for Discharge   Problem: Elimination: Goal: Will not experience complications related to bowel motility 07/06/2019 1008 by Glenard Haring, RN Outcome: Adequate for Discharge 07/06/2019 1007 by Glenard Haring, RN Outcome: Adequate for Discharge Goal: Will not experience complications related to urinary retention 07/06/2019 1008 by Glenard Haring, RN Outcome: Adequate for Discharge 07/06/2019 1007 by Glenard Haring, RN Outcome: Adequate for Discharge   Problem: Pain Managment: Goal: General experience of comfort will improve 07/06/2019 1008 by Glenard Haring, RN Outcome: Adequate for Discharge 07/06/2019 1007 by Glenard Haring, RN Outcome: Adequate for Discharge   Problem: Safety: Goal: Ability to remain free from injury will improve 07/06/2019 1008 by Glenard Haring, RN Outcome: Adequate for Discharge 07/06/2019 1007 by Glenard Haring, RN Outcome: Adequate for Discharge   Problem: Skin Integrity: Goal: Risk for impaired skin integrity will decrease 07/06/2019 1008 by Glenard Haring, RN Outcome: Adequate for Discharge 07/06/2019 1007 by Glenard Haring, RN Outcome: Adequate for Discharge

## 2019-07-06 NOTE — Plan of Care (Signed)
  Problem: Education: Goal: Knowledge of General Education information will improve Description: Including pain rating scale, medication(s)/side effects and non-pharmacologic comfort measures Outcome: Adequate for Discharge   Problem: Health Behavior/Discharge Planning: Goal: Ability to manage health-related needs will improve Outcome: Adequate for Discharge   Problem: Clinical Measurements: Goal: Ability to maintain clinical measurements within normal limits will improve Outcome: Adequate for Discharge Goal: Will remain free from infection Outcome: Adequate for Discharge Goal: Diagnostic test results will improve Outcome: Adequate for Discharge Goal: Respiratory complications will improve Outcome: Adequate for Discharge Goal: Cardiovascular complication will be avoided Outcome: Adequate for Discharge   Problem: Activity: Goal: Risk for activity intolerance will decrease Outcome: Adequate for Discharge   Problem: Nutrition: Goal: Adequate nutrition will be maintained Outcome: Adequate for Discharge   Problem: Coping: Goal: Level of anxiety will decrease Outcome: Adequate for Discharge   Problem: Clinical Measurements: Goal: Ability to maintain clinical measurements within normal limits will improve Outcome: Adequate for Discharge Goal: Will remain free from infection Outcome: Adequate for Discharge Goal: Diagnostic test results will improve Outcome: Adequate for Discharge Goal: Respiratory complications will improve Outcome: Adequate for Discharge Goal: Cardiovascular complication will be avoided Outcome: Adequate for Discharge   Problem: Coping: Goal: Level of anxiety will decrease Outcome: Adequate for Discharge   Problem: Elimination: Goal: Will not experience complications related to bowel motility Outcome: Adequate for Discharge Goal: Will not experience complications related to urinary retention Outcome: Adequate for Discharge   Problem: Pain  Managment: Goal: General experience of comfort will improve Outcome: Adequate for Discharge   Problem: Safety: Goal: Ability to remain free from injury will improve Outcome: Adequate for Discharge   Problem: Skin Integrity: Goal: Risk for impaired skin integrity will decrease Outcome: Adequate for Discharge

## 2019-07-07 ENCOUNTER — Telehealth: Payer: Self-pay

## 2019-07-07 ENCOUNTER — Encounter: Payer: Self-pay | Admitting: *Deleted

## 2019-07-07 NOTE — Telephone Encounter (Signed)
Patient contacted regarding discharge from [facility] on 07/06/2019.  Patient understands to follow up with provider Nell Range PA-C on 07/13/2019 at 1:30 PM at [facility] location. Patient understands discharge instructions? yes Patient understands medications and regiment? yes Patient understands to bring all medications to this visit? Yes  Overall the pt is feeling well today.  The pt still have some SOB and he was advised to continue monitoring this symptom.  If the pt has any worsening in SOB, weight gain or notes swelling then he will contact the office. Pt agreed with plan.

## 2019-07-08 ENCOUNTER — Other Ambulatory Visit: Payer: Self-pay | Admitting: Cardiology

## 2019-07-08 DIAGNOSIS — I350 Nonrheumatic aortic (valve) stenosis: Secondary | ICD-10-CM

## 2019-07-08 DIAGNOSIS — Z952 Presence of prosthetic heart valve: Secondary | ICD-10-CM

## 2019-07-08 NOTE — Progress Notes (Signed)
[facility] [place]                                       Cardiology Office Note    Date:  07/13/2019   ID:  [name] [name], DOB 1931-03-22, MRN [id number]  PCP:  Haywood Pao, MD  Cardiologist:  Dr. Virgina Jock / Dr. Buena Irish & Dr. Cyndia Bent (TAVR)  CC: [facility] s/p TAVR  History of Present Illness:  [name] [name] is a 86 y.o. male with a history of carotid artery disease (0000000 LICA stenosis), HTN, HLD, mild MR/mod TR and severe aortic stenosis s/p TAVR (07/05/19) who presents to clinic for follow up.   An echocardiogram in July 2020 showed moderate to severe aortic stenosis with a mean gradient of 28.4 mmHg and a valve area of 0.93 cm. His most recent echocardiogram on 04/27/2019 showed an increase in the mean gradient to 40.3 mmHg with a valve area of 0.8 cm and dimensionless index of 0.18. Left ventricular systolic function was normal. The patient has remained very active over the years doing ballroom dancing, playing golf, and going to the gym until the COVID-19 pandemic. Over the past several weeks, he reported worsening  dyspnea on exertion and fatigue. This occurred while playing golf and he had never had it before. He underwent cardiac catheterization on 06/03/2019 which showed a focal 40% stenosis in the mid LAD that was nonobstructive. There was no other significant coronary disease.  The patient was evaluated by the multidisciplinary valve team and underwent successful TAVR with a 29 mm Edwards Sapien 3 THV via the TF approach on 07/05/19. Post operative echo showed EF 65%, normally functioning TAVR with a mean gradient of 11.2 mm Hg and no PVL.There as mild MR/TR. He was noted to have some mild sinus tachycardia. He was discharged on aspirin and plavix. His BP was soft and home lasix 20mg  daily held at discharge.   Today he presents to clinic for follow up. No CP or SOB. No LE edema, orthopnea or PND. No dizziness or syncope. No blood in  stool or urine. No palpitations. Mild discomfort at groin.   Past Medical History:  Diagnosis Date  . Acid reflux   . Arthritis    "minor arthritits in hands"  . Carotid artery occlusion   . High cholesterol   . Hypertension   . Pneumonia   . 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 Severe aortic stenosis     Past Surgical History:  Procedure Laterality Date  . COLONOSCOPY    . HERNIA REPAIR N/A   . INTRAOPERATIVE TRANSTHORACIC ECHOCARDIOGRAM N/A 07/05/2019   Procedure: INTRAOPERATIVE TRANSTHORACIC ECHOCARDIOGRAM;  Surgeon: Burnell Blanks, MD;  Location: [place];  Service: Open Heart Surgery;  Laterality: N/A;  . MENISCUS REPAIR N/A   . RIGHT/LEFT HEART CATH AND CORONARY ANGIOGRAPHY N/A 06/21/2019   Procedure: RIGHT/LEFT HEART CATH AND CORONARY ANGIOGRAPHY;  Surgeon: [name], MD;  Location: [place] [facility];  Service: Cardiovascular;  Laterality: N/A;  . TONSILLECTOMY    . TRANSCATHETER AORTIC VALVE REPLACEMENT, TRANSFEMORAL N/A 07/05/2019   Procedure: TRANSCATHETER AORTIC VALVE REPLACEMENT, TRANSFEMORAL;  Surgeon: Burnell Blanks, MD;  Location: [place];  Service: Open Heart Surgery;  Laterality: N/A;    Current Medications: Outpatient  Medications Prior to Visit  Medication Sig Dispense Refill  . aspirin EC 81 MG tablet Take 1 tablet (81 mg total) by mouth daily. 30 tablet 2  . b complex vitamins tablet Take 1 tablet by mouth daily.    . Biotin 10000 MCG TABS Take 10,000 mcg by mouth daily.    . cetirizine (ZYRTEC) 10 MG tablet Take 10 mg by mouth daily.    . cholecalciferol (VITAMIN D3) 25 MCG (1000 UNIT) tablet Take 3,000 Units by mouth daily.    . clobetasol ointment (TEMOVATE) [id number] % Apply 1 application topically daily as needed (precancerous spots).    . L-Lysine 500 MG CAPS Take 500 mg by mouth See admin instructions. Take 500 mg daily, may take 1000-1500 mg instead  when experiencing a cold sore    . Misc Natural Products (OSTEO BI-FLEX ADV TRIPLE ST) TABS Take 1 tablet by mouth daily.    . Misc Natural Products (PROSTATE CONTROL PO) Take 1 tablet by mouth daily.     . Multiple Vitamin (MULTIVITAMIN WITH MINERALS) TABS tablet Take 1 tablet by mouth daily.    . rosuvastatin (CRESTOR) 40 MG tablet Take 40 mg by mouth daily.    . tadalafil (CIALIS) 20 MG tablet Take 40 mg by mouth daily as needed for erectile dysfunction.    . vitamin E 180 MG (400 UNITS) capsule Take 400 Units by mouth daily.    . clopidogrel (PLAVIX) 75 MG tablet Take 1 tablet (75 mg total) by mouth daily with breakfast. 30 tablet 1   No facility-administered medications prior to visit.     Allergies:   Patient has no known allergies.   Social History   Socioeconomic History  . Marital status: Married    Spouse name: Not on file  . Number of children: 2  . Years of education: Not on file  . Highest education level: Not on file  Occupational History  . Occupation: Retired-[employer]  Tobacco Use  . Smoking status: Former Smoker    Packs/day: 1.00    Types: Cigarettes    Quit date: 1990    Years since quitting: 31.3  . Smokeless tobacco: Never Used  Substance and Sexual Activity  . Alcohol use: Yes    Comment: Drinks wine daily - last glass of wine 05/02/19  . Drug use: No  . Sexual activity: Not on file  Other Topics Concern  . Not on file  Social History Narrative  . Not on file   Social Determinants of Health   Financial Resource Strain:   . Difficulty of Paying Living Expenses:   Food Insecurity:   . Worried About Charity fundraiser in the Last Year:   . Arboriculturist in the Last Year:   Transportation Needs:   . Film/video editor (Medical):   Marland Kitchen Lack of Transportation (Non-Medical):   Physical Activity:   . Days of Exercise per Week:   . Minutes of Exercise per Session:   Stress:   . Feeling of Stress :   Social Connections:   . Frequency  of Communication with Friends and Family:   . Frequency of Social Gatherings with Friends and Family:   . Attends Religious Services:   . Active Member of Clubs or Organizations:   . Attends Archivist Meetings:   Marland Kitchen Marital Status:      Family History:  The patient's family history includes Heart attack in his brother; Heart failure in his mother; Parkinson's disease  in his father.     ROS:   Please see the history of present illness.    ROS All other systems reviewed and are negative.   PHYSICAL EXAM:   VS:  BP 130/80   Pulse 100   Ht 5\' 10"  (1.778 m)   Wt 218 lb (98.9 kg)   SpO2 98%   BMI 31.28 kg/m  GEN: Well nourished, well developed, in no acute distress HEENT: normal Neck: no JVD or masses Cardiac: RRR; soft flow murmurs. No rubs, or gallops,no edema  Respiratory:  clear to auscultation bilaterally, normal work of breathing GI: soft, nontender, nondistended, + BS MS: no deformity or atrophy Skin: warm and dry, no rash. Groin sites with significant ecchymosis on back right flank, right groin in to scrotum  Neuro:  Alert and Oriented x 3, Strength and sensation are intact Psych: euthymic mood, full affect   Wt Readings from Last 3 Encounters:  07/13/19 218 lb (98.9 kg)  07/06/19 216 lb 1.6 oz (98 kg)  07/01/19 213 lb 2 oz (96.7 kg)      Studies/Labs Reviewed:   EKG:  EKG is ordered today.  The ekg ordered today demonstrates sinus tachy HR 101 bpm. 1st deg  AV block.   Recent Labs: 06/15/2019: NT-Pro BNP 258 07/01/2019: ALT 27; B Natriuretic Peptide 73.4 07/06/2019: BUN 14; Creatinine, Ser 0.90; Hemoglobin 10.7; Magnesium 1.8; Platelets 117; Potassium 3.6; Sodium 141   Lipid Panel No results found for: CHOL, TRIG, HDL, CHOLHDL, VLDL, LDLCALC, LDLDIRECT  Additional studies/ records that were reviewed today include:  TAVR OPERATIVE NOTE   Date of Procedure:07/05/2019  Preoperative Diagnosis:Severe Aortic Stenosis    Postoperative Diagnosis:Same   Procedure:   Transcatheter Aortic Valve Replacement - PercutaneousLeftTransfemoral Approach Edwards Sapien 3 THV (size 76mm, model # 9600TFX, serial [id number])  Co-Surgeons:Bryan Alveria Apley, MD and Lauree Chandler, MD   Anesthesiologist:Carswell Glennon Mac, MD  Dala Dock, MD  Pre-operative Echo Findings: ? Severe aortic stenosis ? Normalleft ventricular systolic function  Post-operative Echo Findings: ? Noparavalvular leak ? Normalleft ventricular systolic function   _____________    Echo 07/06/19:  IMPRESSIONS  1. Day 1 post TAVR procedure. There are normal transaortic gradients with peak/mean gradient 26/13 mmHg and no paravalvular leak.  2. Left ventricular ejection fraction, by estimation, is 65 to 70%. The  left ventricle has hyperdynamic function. The left ventricle has no  regional wall motion abnormalities. There is moderate concentric left  ventricular hypertrophy. Left ventricular  diastolic parameters are consistent with Grade I diastolic dysfunction  (impaired relaxation). Elevated left atrial pressure.  3. Right ventricular systolic function is normal. The right ventricular  size is normal. There is mildly elevated pulmonary artery systolic  pressure. The estimated right ventricular systolic pressure is [id number] mmHg.  4. Left atrial size was moderately dilated.  5. Right atrial size was mildly dilated.  6. The mitral valve is normal in structure. Mild mitral valve  regurgitation. No evidence of mitral stenosis.  7. The aortic valve has been repaired/replaced. Aortic valve  regurgitation is not visualized. No aortic stenosis is present. There is a  29 mm Edwards Sapien prosthetic (TAVR) valve present in the aortic  position. Procedure Date: 07/05/2019. Echo findings are consistent with normal  structure and function of the aortic  valve prosthesis.  8. The inferior vena cava is normal in size with greater than 50%  respiratory variability, suggesting right atrial pressure of 3 mmHg.   ASSESSMENT & PLAN:   Severe AS s/p TAVR: doing  well. Groin sites are stable although he does have quite a bit of ecchymosis. Continue on aspirin and plavix. He canstop plavix 6 months out from his procedure (01/05/20). SBE prophylaxis discussed; I have RX'd amoxicillin. He has an echo and appt with Dr. Virgina Jock in June.  HTN: BP well controlled. Resume home lasix 20mg  daily.   Sinus tachycardia: HR 100 bpm today. He says it runs in 73s at home  Carotid artery disease: continue medical therapy. Followed by Dr. Virgina Jock  HLD: continue statin    Medication Adjustments/Labs and Tests Ordered: Current medicines are reviewed at length with the patient today.  Concerns regarding medicines are outlined above.  Medication changes, Labs and Tests ordered today are listed in the Patient Instructions below. Patient Instructions  Medication Instructions:  1) RESTART LASIX 20 mg daily  2) Your provider discussed the importance of taking an antibiotic prior to all dental visits to prevent damage to the heart valves from infection. You were given a prescription for AMOXIL 2,000 mg to take one hour prior to any dental appointment.   3) You may STOP PLAVIX 01/05/2020  *If you need a refill on your cardiac medications before your next appointment, please call your pharmacy*   Follow-Up: Please keep your follow-up appointments!  We will call you to arrange your 1 year TAVR visits.    Signed, Angelena Form, PA-C  07/13/2019 2:22 PM    [facility] [place], [place], [place]  [address] Phone: [phone]; Fax: [phone]

## 2019-07-11 ENCOUNTER — Telehealth (HOSPITAL_COMMUNITY): Payer: Self-pay

## 2019-07-11 MED FILL — Heparin Sodium (Porcine) Inj 1000 Unit/ML: INTRAMUSCULAR | Qty: 30 | Status: AC

## 2019-07-11 MED FILL — Potassium Chloride Inj 2 mEq/ML: INTRAVENOUS | Qty: 40 | Status: AC

## 2019-07-11 MED FILL — Magnesium Sulfate Inj 50%: INTRAMUSCULAR | Qty: 10 | Status: AC

## 2019-07-11 NOTE — Telephone Encounter (Signed)
Pt insurance is active and benefits verified through Medicare a/b Co-pay 0, DED $203/$203 met, out of pocket 0/0 met, co-insurance 20%. no pre-authorization required. Passport, 07/11/2019_0 :55pm, REF# [id number]  Will contact patient to see if he is interested in the Cardiac Rehab Program. If interested, patient will need to complete follow up appt. Once completed, patient will be contacted for scheduling upon review by the RN Navigator.  2ndary insurance is active and benefits verified through [employer]. Co-pay 0, DED 0/0 met, out of pocket 0/0 met, co-insurance 0. No pre-authorization required. Passport, [place]/BCBS 07/11/2019_1 :18pm, REF# [id number]

## 2019-07-13 ENCOUNTER — Encounter: Payer: Self-pay | Admitting: Physician Assistant

## 2019-07-13 ENCOUNTER — Other Ambulatory Visit: Payer: Self-pay | Admitting: Physician Assistant

## 2019-07-13 ENCOUNTER — Telehealth: Payer: Self-pay | Admitting: Physician Assistant

## 2019-07-13 ENCOUNTER — Other Ambulatory Visit: Payer: Self-pay

## 2019-07-13 ENCOUNTER — Ambulatory Visit (INDEPENDENT_AMBULATORY_CARE_PROVIDER_SITE_OTHER): Payer: Medicare Other | Admitting: Physician Assistant

## 2019-07-13 VITALS — BP 130/80 | HR 100 | Ht 70.0 in | Wt 218.0 lb

## 2019-07-13 DIAGNOSIS — I10 Essential (primary) hypertension: Secondary | ICD-10-CM | POA: Diagnosis not present

## 2019-07-13 DIAGNOSIS — E785 Hyperlipidemia, unspecified: Secondary | ICD-10-CM

## 2019-07-13 DIAGNOSIS — R000 Tachycardia, unspecified: Secondary | ICD-10-CM | POA: Diagnosis not present

## 2019-07-13 DIAGNOSIS — Z952 Presence of prosthetic heart valve: Secondary | ICD-10-CM

## 2019-07-13 DIAGNOSIS — I779 Disorder of arteries and arterioles, unspecified: Secondary | ICD-10-CM | POA: Diagnosis not present

## 2019-07-13 MED ORDER — FUROSEMIDE 20 MG PO TABS
20.0000 mg | ORAL_TABLET | Freq: Every day | ORAL | 3 refills | Status: DC
Start: 2019-07-13 — End: 2020-08-13

## 2019-07-13 MED ORDER — CLOPIDOGREL BISULFATE 75 MG PO TABS: 75.0000 mg | ORAL_TABLET | Freq: Every day | ORAL | 1 refills | Status: DC

## 2019-07-13 MED ORDER — AMOXICILLIN 500 MG PO TABS
ORAL_TABLET | ORAL | 12 refills | Status: DC
Start: 2019-07-13 — End: 2022-04-28

## 2019-07-13 NOTE — Telephone Encounter (Signed)
° °  Pt said he forgot to ask [name] [name] when we can start driving again  Please advise

## 2019-07-13 NOTE — Patient Instructions (Signed)
Medication Instructions:  1) RESTART LASIX 20 mg daily  2) Your provider discussed the importance of taking an antibiotic prior to all dental visits to prevent damage to the heart valves from infection. You were given a prescription for AMOXIL 2,000 mg to take one hour prior to any dental appointment.   3) You may STOP PLAVIX 01/05/2020  *If you need a refill on your cardiac medications before your next appointment, please call your pharmacy*   Follow-Up: Please keep your follow-up appointments!  We will call you to arrange your 1 year TAVR visits.

## 2019-07-13 NOTE — Telephone Encounter (Signed)
Per Nell Range, PA, informed patient he is cleared to drive. He was grateful for assistance.

## 2019-07-18 NOTE — Telephone Encounter (Signed)
Called and spoke with pt in regards to CR, pt stated he would like to wait until his follow up appt with Patwardhan on 6/17.  Will follow up with pt after 6/17.

## 2019-07-27 DIAGNOSIS — M9902 Segmental and somatic dysfunction of thoracic region: Secondary | ICD-10-CM | POA: Diagnosis not present

## 2019-07-27 DIAGNOSIS — M9903 Segmental and somatic dysfunction of lumbar region: Secondary | ICD-10-CM | POA: Diagnosis not present

## 2019-07-27 DIAGNOSIS — M9905 Segmental and somatic dysfunction of pelvic region: Secondary | ICD-10-CM | POA: Diagnosis not present

## 2019-07-27 DIAGNOSIS — M5414 Radiculopathy, thoracic region: Secondary | ICD-10-CM | POA: Diagnosis not present

## 2019-07-27 DIAGNOSIS — M5136 Other intervertebral disc degeneration, lumbar region: Secondary | ICD-10-CM | POA: Diagnosis not present

## 2019-07-27 DIAGNOSIS — M5417 Radiculopathy, lumbosacral region: Secondary | ICD-10-CM | POA: Diagnosis not present

## 2019-08-03 ENCOUNTER — Other Ambulatory Visit: Payer: Self-pay | Admitting: Physician Assistant

## 2019-08-17 ENCOUNTER — Ambulatory Visit: Payer: Medicare Other

## 2019-08-17 ENCOUNTER — Other Ambulatory Visit: Payer: Self-pay

## 2019-08-17 DIAGNOSIS — Z952 Presence of prosthetic heart valve: Secondary | ICD-10-CM

## 2019-08-18 ENCOUNTER — Ambulatory Visit: Payer: Medicare Other | Admitting: Cardiology

## 2019-08-18 ENCOUNTER — Encounter: Payer: Self-pay | Admitting: Cardiology

## 2019-08-18 ENCOUNTER — Other Ambulatory Visit: Payer: Self-pay

## 2019-08-18 VITALS — BP 115/55 | HR 104 | Resp 17 | Ht 70.0 in | Wt 215.0 lb

## 2019-08-18 DIAGNOSIS — I6523 Occlusion and stenosis of bilateral carotid arteries: Secondary | ICD-10-CM | POA: Diagnosis not present

## 2019-08-18 DIAGNOSIS — Z952 Presence of prosthetic heart valve: Secondary | ICD-10-CM

## 2019-08-18 DIAGNOSIS — I10 Essential (primary) hypertension: Secondary | ICD-10-CM

## 2019-08-18 DIAGNOSIS — I350 Nonrheumatic aortic (valve) stenosis: Secondary | ICD-10-CM | POA: Diagnosis not present

## 2019-08-18 NOTE — Progress Notes (Addendum)
Patient is here for follow up visit.  Subjective:   @Patient  ID: [name] [name], male    DOB: October 21, 1931, 86 y.o.   MRN: [id number]   Chief Complaint  Patient presents with  . Aortic Stenosis  . 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  Patient underwent successful TAVR in 07/2019. Echo on 6/16 shows well functioning valve. Shortness of breath has resolved since TAVR. He played gold couple times since TAVR, but stopped playing after a hot day when he got tired while doing so. He wants to know if he should undergo cardiac rehab.   Current Outpatient Medications on File Prior to Visit  Medication Sig Dispense Refill  . amoxicillin (AMOXIL) 500 MG tablet Take 4 capsules (2,000 mg) one hour prior to dental visits. 8 tablet 12  . aspirin EC 81 MG tablet Take 1 tablet (81 mg total) by mouth daily. 30 tablet 2  . b complex vitamins tablet Take 1 tablet by mouth daily.    . Biotin 10000 MCG TABS Take 10,000 mcg by mouth daily.    . cetirizine (ZYRTEC) 10 MG tablet Take 10 mg by mouth daily.    . cholecalciferol (VITAMIN D3) 25 MCG (1000 UNIT) tablet Take 3,000 Units by mouth daily.    . clobetasol ointment (TEMOVATE) 6.57 % Apply 1 application topically daily as needed (precancerous spots).    . clopidogrel (PLAVIX) 75 MG tablet Take 1 tablet (75 mg total) by mouth daily with breakfast. 90 tablet 1  . furosemide (LASIX) 20 MG tablet Take 1 tablet (20 mg total) by mouth daily. 90 tablet 3  . L-Lysine 500 MG CAPS Take 500 mg by mouth See admin instructions. Take 500 mg daily, may take 1000-1500 mg instead when experiencing a cold sore    . Misc Natural Products (OSTEO BI-FLEX ADV TRIPLE ST) TABS Take 1 tablet by mouth daily.    . Misc Natural Products (PROSTATE CONTROL PO) Take 1 tablet by mouth daily.     . Multiple Vitamin (MULTIVITAMIN WITH MINERALS)  TABS tablet Take 1 tablet by mouth daily.    . rosuvastatin (CRESTOR) 40 MG tablet Take 40 mg by mouth daily.    . tadalafil (CIALIS) 20 MG tablet Take 40 mg by mouth daily as needed for erectile dysfunction.    . vitamin E 180 MG (400 UNITS) capsule Take 400 Units by mouth daily.     No current facility-administered medications on file prior to visit.    Cardiovascular studies:  Echocardiogram 08/17/2019: Normal LV systolic function with EF 62%. Severe concentric hypertrophy of the left ventricle. Normal global wall motion. Left ventricle cavity is normal in size. Doppler evidence of grade I (impaired) diastolic dysfunction, normal LAP. Calculated EF 62%. Left atrial cavity is mildly dilated. 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 Well seated and well functioning bioprosthetic aortic valve (Edwards Sapien 3 THV size 29 mm, placed 07/2019). Mean PG 6 mmHg. No paravalvular leak. Mild (Grade I) mitral regurgitation. Mild tricuspid regurgitation. Estimated pulmonary artery systolic pressure is 27 mmHg. Mild pulmonic regurgitation. Compared to previous post TAVR study on 5.5.2021, RVSP reduced from 34 mmHg to 27 mmHg.   EKG 07/13/2019: Sinus tachycardia 10 bpm First degree AV block  07/05/2019: Transfemoral TAVR Edwards Sapien 3 THV (size 29 mm)  RHC/Cor 06/21/2019: LM: Normal LAD: Medial  calcification seen in prox LAD without significant stenosis         Mid LAD focal 40% stenosis LCX: Large, dominant.         Medial calcification seen in prox LCX without significant stenosis RCA: Small, nondominant. Normal.  Normal right heart cath   EKG 05/16/2019: Sinus rhythm 76 bpm. Left atrial enlargement.  Probable old inferior infarct.  Carotid artery duplex 03/30/2019:  Stenosis in the right internal carotid artery (16-49%).  Stenosis in the left internal carotid artery (50-69%).  Antegrade right vertebral artery flow. Antegrade left  vertebral artery  flow.  Follow up in six months is appropriate if clinically indicated.  Compared to 09/27/2018, no significant change.    Review of Systems  Cardiovascular: Negative for chest pain, dyspnea on exertion, leg swelling, palpitations and syncope.       Objective:    Vitals:   08/18/19 1142  BP: (!) 115/55  Pulse: (!) 104  Resp: 17  SpO2: 96%     Physical Exam Vitals and nursing note reviewed.  Constitutional:      General: He is not in acute distress. HENT:     Head: Normocephalic and atraumatic.  Neck:     Vascular: No JVD.  Cardiovascular:     Rate and Rhythm: Normal rate and regular rhythm.     Pulses: Intact distal pulses.     Heart sounds: Murmur heard.  Harsh midsystolic murmur is present with a grade of 1/6 at the upper right sternal border radiating to the neck.   Pulmonary:     Effort: Pulmonary effort is normal.     Breath sounds: Normal breath sounds. No wheezing or rales.  Neurological:     Cranial Nerves: No cranial nerve deficit.         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  Aortic stenosis: Now resolved s/p TAVR. No dyspnea now.  Reviewed echocardiogram 6/17. Valve functioning well. No paravalvular leak.  Continue Aspirin, plavix. Post TAVR EKG showed sinus tach with first degree AV block. Rate should improve with conditioning. Do not want to use beta blocker in the setting of low normal BP and first degree AV block  Carotid artery stenosis: Bilateral, asymptomatic.  Continue aspirin, statin.    Hypertension: Well-controlled.  F/u in 6 months    Adrianah Prophete Esther Hardy, MD [facility] Pager: [phone] Office: [phone] If no answer Cell [phone]      ADDENDUM: pt doing very well with NYHA class I symptoms. [facility] Cardiomyopathy Questionnaire   KCCQ-12 08/22/2019 06/17/2019  1 a. Ability to shower/bathe Not at all limited Not at all limited  1 b. Ability to walk 1 block Not at all limited Moderately limited  1 c. Ability to hurry/jog Slightly limited Other, Did not do  2. Edema feet/ankles/legs Never over the past 2 weeks Never over the past 2 weeks  3. Limited by fatigue Never over the past 2 weeks 1-2 times a week  4. Limited by dyspnea Never over the past 2 weeks 1-2 times a week  5. Sitting up / on 3+ pillows Never over the past 2 weeks Never over the past 2 weeks  6. Limited enjoyment of life Not limited at all Slightly limited  7. Rest of life w/ symptoms Completely satisfied -  8 a. Participation in hobbies Did not limit at all Slightly limited  8 b. Participation in  chores Did not limit at all Moderately limited  8 c. Visiting family/friends Did not limit at all Did not limit at all    Angelena Form PA-C  [facility]

## 2019-08-24 ENCOUNTER — Telehealth: Payer: Self-pay | Admitting: Cardiovascular Disease

## 2019-08-24 NOTE — Telephone Encounter (Signed)
Patient calling about a bill he got for a procedure that was done by Dr. Angelena Form on 07/05/19. He states the bill was for $3000. Claim # [id number] and ID # [id number]

## 2019-08-31 DIAGNOSIS — M9902 Segmental and somatic dysfunction of thoracic region: Secondary | ICD-10-CM | POA: Diagnosis not present

## 2019-08-31 DIAGNOSIS — M5417 Radiculopathy, lumbosacral region: Secondary | ICD-10-CM | POA: Diagnosis not present

## 2019-08-31 DIAGNOSIS — M9905 Segmental and somatic dysfunction of pelvic region: Secondary | ICD-10-CM | POA: Diagnosis not present

## 2019-08-31 DIAGNOSIS — M5414 Radiculopathy, thoracic region: Secondary | ICD-10-CM | POA: Diagnosis not present

## 2019-08-31 DIAGNOSIS — M5136 Other intervertebral disc degeneration, lumbar region: Secondary | ICD-10-CM | POA: Diagnosis not present

## 2019-08-31 DIAGNOSIS — M9903 Segmental and somatic dysfunction of lumbar region: Secondary | ICD-10-CM | POA: Diagnosis not present

## 2019-09-12 ENCOUNTER — Telehealth (HOSPITAL_COMMUNITY): Payer: Self-pay | Admitting: Pharmacist

## 2019-09-13 NOTE — Telephone Encounter (Signed)
Cardiac Rehab Medication Review by a Pharmacist  Does the patient  feel that his/her medications are working for him/her?  yes  Has the patient been experiencing any side effects to the medications prescribed?  no  Does the patient measure his/her own blood pressure or blood glucose at home?  yes - only measures BP. States he writes down readings but does not have with him during call.  Does the patient have any problems obtaining medications due to transportation or finances?   no  Understanding of regimen: good Understanding of indications: good Potential of compliance: good  Fara Olden, PharmD PGY-1 Pharmacy Resident 09/13/2019 1:39 PM  Please check [email or web address] for unit-specific pharmacy phone numbers.

## 2019-09-16 ENCOUNTER — Other Ambulatory Visit: Payer: Self-pay

## 2019-09-16 ENCOUNTER — Encounter (HOSPITAL_COMMUNITY)
Admission: RE | Admit: 2019-09-16 | Discharge: 2019-09-16 | Disposition: A | Discharge status: Home / Self Care | Payer: Medicare Other | Source: Ambulatory Visit | Attending: Cardiology | Admitting: Cardiology

## 2019-09-16 DIAGNOSIS — Z952 Presence of prosthetic heart valve: Secondary | ICD-10-CM | POA: Insufficient documentation

## 2019-09-16 NOTE — Progress Notes (Signed)
Cardiac Rehab Telephone Note:  Successful telephone encounter to [name] [name] to confirm Cardiac Rehab orientation appointment for 09/20/19 at 9:00. Nursing assessment completed. Patient questions answered. Instructions for appointment provided. Patient screening for Covid-19 negative.  Tallon Gertz E. Rollene Rotunda RN, BSN [place]. [facility]  Cardiac and Pulmonary Rehabilitation Phone: [phone] Fax: [phone]

## 2019-09-20 ENCOUNTER — Other Ambulatory Visit: Payer: Self-pay

## 2019-09-20 ENCOUNTER — Encounter (HOSPITAL_COMMUNITY): Payer: Self-pay

## 2019-09-20 ENCOUNTER — Encounter (HOSPITAL_COMMUNITY)
Admission: RE | Admit: 2019-09-20 | Discharge: 2019-09-20 | Disposition: A | Discharge status: Home / Self Care | Payer: Medicare Other | Source: Ambulatory Visit | Attending: Cardiology | Admitting: Cardiology

## 2019-09-20 VITALS — BP 144/70 | HR 92 | Ht 69.5 in | Wt 211.0 lb

## 2019-09-20 DIAGNOSIS — Z952 Presence of prosthetic heart valve: Secondary | ICD-10-CM

## 2019-09-20 NOTE — Progress Notes (Signed)
Cardiac Individual Treatment Plan  Patient Details  Name: [name] [name] MRN: [id number] Date of Birth: 02/06/1932 Referring Provider:     [place] from 09/20/2019 in [place]  Referring Provider Mickel Crow MD      Initial Encounter Date:    CARDIAC REHAB PHASE II ORIENTATION from 09/20/2019 in [place]  Date 09/20/19      Visit Diagnosis: 07/05/19 TAVR  Patient's Home Medications on Admission:  Current Outpatient Medications:  .  amoxicillin (AMOXIL) 500 MG tablet, Take 4 capsules (2,000 mg) one hour prior to dental visits., Disp: 8 tablet, Rfl: 12 .  aspirin EC 81 MG tablet, Take 1 tablet (81 mg total) by mouth daily., Disp: 30 tablet, Rfl: 2 .  b complex vitamins tablet, Take 1 tablet by mouth daily., Disp: , Rfl:  .  Biotin 10000 MCG TABS, Take 10,000 mcg by mouth daily., Disp: , Rfl:  .  cetirizine (ZYRTEC) 10 MG tablet, Take 10 mg by mouth daily., Disp: , Rfl:  .  cholecalciferol (VITAMIN D3) 25 MCG (1000 UNIT) tablet, Take 3,000 Units by mouth daily., Disp: , Rfl:  .  clopidogrel (PLAVIX) 75 MG tablet, Take 1 tablet (75 mg total) by mouth daily with breakfast., Disp: 90 tablet, Rfl: 1 .  furosemide (LASIX) 20 MG tablet, Take 1 tablet (20 mg total) by mouth daily., Disp: 90 tablet, Rfl: 3 .  L-Lysine 500 MG CAPS, Take 500 mg by mouth See admin instructions. Take 500 mg daily or as needed, may take 1000-1500 mg instead when experiencing a cold sore, Disp: , Rfl:  .  Misc Natural Products (OSTEO BI-FLEX ADV TRIPLE ST) TABS, Take 1 tablet by mouth daily., Disp: , Rfl:  .  Misc Natural Products (PROSTATE CONTROL PO), Take 1 tablet by mouth daily. , Disp: , Rfl:  .  Multiple Vitamin (MULTIVITAMIN WITH MINERALS) TABS tablet, Take 1 tablet by mouth daily., Disp: , Rfl:  .  rosuvastatin (CRESTOR) 40 MG tablet, Take 40 mg by mouth daily., Disp: , Rfl:  .  tadalafil (CIALIS) 20 MG  tablet, Take 40 mg by mouth daily as needed for erectile dysfunction., Disp: , Rfl:  .  vitamin E 180 MG (400 UNITS) capsule, Take 400 Units by mouth daily., Disp: , Rfl:   Past Medical History: Past Medical History:  Diagnosis Date  . Acid reflux   . Arthritis    "minor arthritits in hands"  . Carotid artery occlusion   . High cholesterol   . Hypertension   . Pneumonia   . 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 Severe aortic stenosis     Tobacco Use: Social History   Tobacco Use  Smoking Status Former Smoker  . Packs/day: 1.00  . Years: 20.00  . Pack years: 20.00  . Types: Cigarettes  . Quit date: 50  . Years since quitting: 31.5  Smokeless Tobacco Never Used    Labs: Recent Chemical engineer    Labs for ITP Cardiac and Pulmonary Rehab Latest Ref Rng & Units 07/01/2019 07/05/2019 07/05/2019 07/05/2019 07/05/2019   Hemoglobin A1c 4.8 - 5.6 % 5.5 - - - -   PHART 7.35 - 7.45 7.417 - - - -   PCO2ART 32 - 48 mmHg 40.2 - - - -   HCO3 20.0 - 28.0 mmol/L 25.4 - - - -  TCO2 22 - 32 mmol/L - 25 25 27 24    O2SAT % 98.2 - - - -      Capillary Blood Glucose: No results found for: GLUCAP   Exercise Target Goals: Exercise Program Goal: Individual exercise prescription set using results from initial 6 min walk test and THRR while considering  patient's activity barriers and safety.   Exercise Prescription Goal: Starting with aerobic activity 30 plus minutes a day, 3 days per week for initial exercise prescription. Provide home exercise prescription and guidelines that participant acknowledges understanding prior to discharge.  Activity Barriers & Risk Stratification:  Activity Barriers & Cardiac Risk Stratification - 09/20/19 0922      Activity Barriers & Cardiac Risk Stratification   Activity Barriers Arthritis   Arthritis: right knee, back, right wrist and hand.   Cardiac Risk  Stratification High           6 Minute Walk:  6 Minute Walk    Row Name 09/20/19 0933         6 Minute Walk   Phase Initial     Distance 1570 feet     Walk Time 6 minutes     # of Rest Breaks 0     MPH 2.97     METS 2.42     RPE 11     Perceived Dyspnea  0     VO2 Peak 8.5     Symptoms No     Resting HR 92 bpm     Resting BP 144/70     Resting Oxygen Saturation  97 %     Exercise Oxygen Saturation  during 6 min walk 96 %     Max Ex. HR 113 bpm     Max Ex. BP 120/82     2 Minute Post BP 122/80            Oxygen Initial Assessment:   Oxygen Re-Evaluation:   Oxygen Discharge (Final Oxygen Re-Evaluation):   Initial Exercise Prescription:  Initial Exercise Prescription - 09/20/19 1000      Date of Initial Exercise RX and Referring Provider   Date 09/20/19    Referring Provider Vernell Leep I. MD    Expected Discharge Date 11/18/19      Treadmill   MPH 1.7    Grade 0    Minutes 15    METs 2.3      NuStep   Level 3    SPM 85    Minutes 15    METs 2.3      Prescription Details   Frequency (times per week) 2    Duration Progress to 30 minutes of continuous aerobic without signs/symptoms of physical distress      Intensity   THRR 40-80% of Max Heartrate 54-108    Ratings of Perceived Exertion 11-13    Perceived Dyspnea 0-4      Progression   Progression Continue to progress workloads to maintain intensity without signs/symptoms of physical distress.      Resistance Training   Training Prescription Yes    Weight 3lbs    Reps 10-15           Perform Capillary Blood Glucose checks as needed.  Exercise Prescription Changes:   Exercise Comments:   Exercise Goals and Review:   Exercise Goals    Row Name 09/20/19 5009             Exercise Goals   Increase Physical Activity Yes  Intervention Provide advice, education, support and counseling about physical activity/exercise needs.;Develop an individualized exercise  prescription for aerobic and resistive training based on initial evaluation findings, risk stratification, comorbidities and participant's personal goals.       Expected Outcomes Short Term: Attend rehab on a regular basis to increase amount of physical activity.;Long Term: Exercising regularly at least 3-5 days a week.;Long Term: Add in home exercise to make exercise part of routine and to increase amount of physical activity.       Increase Strength and Stamina Yes       Intervention Provide advice, education, support and counseling about physical activity/exercise needs.;Develop an individualized exercise prescription for aerobic and resistive training based on initial evaluation findings, risk stratification, comorbidities and participant's personal goals.       Expected Outcomes Short Term: Increase workloads from initial exercise prescription for resistance, speed, and METs.;Short Term: Perform resistance training exercises routinely during rehab and add in resistance training at home;Long Term: Improve cardiorespiratory fitness, muscular endurance and strength as measured by increased METs and functional capacity (6MWT)       Able to understand and use rate of perceived exertion (RPE) scale Yes       Intervention Provide education and explanation on how to use RPE scale       Expected Outcomes Short Term: Able to use RPE daily in rehab to express subjective intensity level;Long Term:  Able to use RPE to guide intensity level when exercising independently       Knowledge and understanding of Target Heart Rate Range (THRR) Yes       Intervention Provide education and explanation of THRR including how the numbers were predicted and where they are located for reference       Expected Outcomes Short Term: Able to state/look up THRR;Long Term: Able to use THRR to govern intensity when exercising independently;Short Term: Able to use daily as guideline for intensity in rehab       Able to check pulse  independently Yes       Intervention Provide education and demonstration on how to check pulse in carotid and radial arteries.;Review the importance of being able to check your own pulse for safety during independent exercise       Expected Outcomes Short Term: Able to explain why pulse checking is important during independent exercise;Long Term: Able to check pulse independently and accurately       Understanding of Exercise Prescription Yes       Intervention Provide education, explanation, and written materials on patient's individual exercise prescription       Expected Outcomes Short Term: Able to explain program exercise prescription;Long Term: Able to explain home exercise prescription to exercise independently              Exercise Goals Re-Evaluation :    Discharge Exercise Prescription (Final Exercise Prescription Changes):   Nutrition:  Target Goals: Understanding of nutrition guidelines, daily intake of sodium 1500mg , cholesterol 200mg , calories 30% from fat and 7% or less from saturated fats, daily to have 5 or more servings of fruits and vegetables.  Biometrics:  Pre Biometrics - 09/20/19 0904      Pre Biometrics   Height 5' 9.5" (1.765 m)    Weight 95.7 kg    Waist Circumference 41.5 inches    Hip Circumference 45.5 inches    Waist to Hip Ratio 0.91 %    BMI (Calculated) 30.72    Triceps Skinfold 18.5 mm    %  Body Fat 30.6 %    Grip Strength 29.5 kg    Flexibility 0 in    Single Leg Stand 4.93 seconds            Nutrition Therapy Plan and Nutrition Goals:   Nutrition Assessments:   Nutrition Goals Re-Evaluation:   Nutrition Goals Discharge (Final Nutrition Goals Re-Evaluation):   Psychosocial: Target Goals: Acknowledge presence or absence of significant depression and/or stress, maximize coping skills, provide positive support system. Participant is able to verbalize types and ability to use techniques and skills needed for reducing stress and  depression.  Initial Review & Psychosocial Screening:  Initial Psych Review & Screening - 09/20/19 1206      Initial Review   Current issues with None Identified      Family Dynamics   Good Support System? Yes   Gerrad has his wife and two children for support     Barriers   Psychosocial barriers to participate in program There are no identifiable barriers or psychosocial needs.      Screening Interventions   Interventions Encouraged to exercise           Quality of Life Scores:  Quality of Life - 09/20/19 1047      Quality of Life   Select Quality of Life      Quality of Life Scores   Health/Function Pre 29.67 %    Socioeconomic Pre 27.5 %    Psych/Spiritual Pre 29.64 %    Family Pre 29.5 %    GLOBAL Pre 29.19 %          Scores of 19 and below usually indicate a poorer quality of life in these areas.  A difference of  2-3 points is a clinically meaningful difference.  A difference of 2-3 points in the total score of the Quality of Life Index has been associated with significant improvement in overall quality of life, self-image, physical symptoms, and general health in studies assessing change in quality of life.  PHQ-9: Recent Review Flowsheet Data    Depression screen [facility] 2/9 09/20/2019   Decreased Interest 0   Down, Depressed, Hopeless 0   PHQ - 2 Score 0     Interpretation of Total Score  Total Score Depression Severity:  1-4 = Minimal depression, 5-9 = Mild depression, 10-14 = Moderate depression, 15-19 = Moderately severe depression, 20-27 = Severe depression   Psychosocial Evaluation and Intervention:   Psychosocial Re-Evaluation:   Psychosocial Discharge (Final Psychosocial Re-Evaluation):   Vocational Rehabilitation: Provide vocational rehab assistance to qualifying candidates.   Vocational Rehab Evaluation & Intervention:  Vocational Rehab - 09/20/19 1207      Initial Vocational Rehab Evaluation & Intervention   Assessment shows need for  Vocational Rehabilitation No   Heaven is retired and does not need vocational rehab at this time          Education: Education Goals: Education classes will be provided on a weekly basis, covering required topics. Participant will state understanding/return demonstration of topics presented.  Learning Barriers/Preferences:   Education Topics: Hypertension, Hypertension Reduction -Define heart disease and high blood pressure. Discus how high blood pressure affects the body and ways to reduce high blood pressure.   Exercise and Your Heart -Discuss why it is important to exercise, the FITT principles of exercise, normal and abnormal responses to exercise, and how to exercise safely.   Angina -Discuss definition of angina, causes of angina, treatment of angina, and how to decrease risk of having angina.  Cardiac Medications -Review what the following cardiac medications are used for, how they affect the body, and side effects that may occur when taking the medications.  Medications include Aspirin, Beta blockers, calcium channel blockers, ACE Inhibitors, angiotensin receptor blockers, diuretics, digoxin, and antihyperlipidemics.   Congestive Heart Failure -Discuss the definition of CHF, how to live with CHF, the signs and symptoms of CHF, and how keep track of weight and sodium intake.   Heart Disease and Intimacy -Discus the effect sexual activity has on the heart, how changes occur during intimacy as we age, and safety during sexual activity.   Smoking Cessation / COPD -Discuss different methods to quit smoking, the health benefits of quitting smoking, and the definition of COPD.   Nutrition I: Fats -Discuss the types of cholesterol, what cholesterol does to the heart, and how cholesterol levels can be controlled.   Nutrition II: Labels -Discuss the different components of food labels and how to read food label   Heart Parts/Heart Disease and PAD -Discuss the anatomy  of the heart, the pathway of blood circulation through the heart, and these are affected by heart disease.   Stress I: Signs and Symptoms -Discuss the causes of stress, how stress may lead to anxiety and depression, and ways to limit stress.   Stress II: Relaxation -Discuss different types of relaxation techniques to limit stress.   Warning Signs of Stroke / TIA -Discuss definition of a stroke, what the signs and symptoms are of a stroke, and how to identify when someone is having stroke.   Knowledge Questionnaire Score:  Knowledge Questionnaire Score - 09/20/19 1048      Knowledge Questionnaire Score   Pre Score 16/24           Core Components/Risk Factors/Patient Goals at Admission:  Personal Goals and Risk Factors at Admission - 09/20/19 1048      Core Components/Risk Factors/Patient Goals on Admission    Weight Management Yes;Obesity    Intervention Weight Management/Obesity: Establish reasonable short term and long term weight goals.;Obesity: Provide education and appropriate resources to help participant work on and attain dietary goals.    Admit Weight 210 lb 15.7 oz (95.7 kg)    Expected Outcomes Short Term: Continue to assess and modify interventions until short term weight is achieved;Long Term: Adherence to nutrition and physical activity/exercise program aimed toward attainment of established weight goal    Hypertension Yes    Intervention Provide education on lifestyle modifcations including regular physical activity/exercise, weight management, moderate sodium restriction and increased consumption of fresh fruit, vegetables, and low fat dairy, alcohol moderation, and smoking cessation.;Monitor prescription use compliance.    Expected Outcomes Short Term: Continued assessment and intervention until BP is < 140/45mm HG in hypertensive participants. < 130/48mm HG in hypertensive participants with diabetes, heart failure or chronic kidney disease.;Long Term: Maintenance  of blood pressure at goal levels.    Lipids Yes    Intervention Provide education and support for participant on nutrition & aerobic/resistive exercise along with prescribed medications to achieve LDL 70mg , HDL >40mg .    Expected Outcomes Short Term: Participant states understanding of desired cholesterol values and is compliant with medications prescribed. Participant is following exercise prescription and nutrition guidelines.;Long Term: Cholesterol controlled with medications as prescribed, with individualized exercise RX and with personalized nutrition plan. Value goals: LDL < 70mg , HDL > 40 mg.           Core Components/Risk Factors/Patient Goals Review:    Core Components/Risk Factors/Patient Goals at Discharge (  Final Review):    ITP Comments:  ITP Comments    Row Name 09/20/19 0905           ITP Comments Medical Director- Dr. Fransico Him, MD              Comments: Samson attended orientation on 09/20/2019 to review rules and guidelines for program.  Completed 6 minute walk test, Intitial ITP, and exercise prescription.  VSS. Telemetry-Sinus Rhythm with a first degree heart block, this has been previously documented.  Asymptomatic. Safety measures and social distancing in place per CDC guidelines. Byrant plans on attending exercise twice a week so that he can play golf on Friday's.Barnet Pall, RN,BSN 09/20/2019 12:19 PM

## 2019-09-26 ENCOUNTER — Other Ambulatory Visit: Payer: Self-pay

## 2019-09-26 ENCOUNTER — Encounter (HOSPITAL_COMMUNITY)
Admission: RE | Admit: 2019-09-26 | Discharge: 2019-09-26 | Disposition: A | Discharge status: Home / Self Care | Payer: Medicare Other | Source: Ambulatory Visit | Attending: Cardiology | Admitting: Cardiology

## 2019-09-26 DIAGNOSIS — Z952 Presence of prosthetic heart valve: Secondary | ICD-10-CM | POA: Diagnosis not present

## 2019-09-26 NOTE — Progress Notes (Signed)
Daily Session Note  Patient Details  Name: [name] [name] MRN: [id number] Date of Birth: 02/15/1932 Referring Provider:     CARDIAC REHAB PHASE II ORIENTATION from 09/20/2019 in [place]  Referring Provider Mickel Crow MD      Encounter Date: 09/26/2019  Check In:  Session Check In - 09/26/19 0903      Check-In   Supervising physician immediately available to respond to emergencies [facility] immediately available    Physician(s) Dr. Sabino Gasser    Location [facility]    Staff Present Seward Carol, MS, ACSM CEP, Exercise Physiologist;Myquan Schaumburg, RN, Milus Glazier, MS, EP-C, CCRP;Jessica Hassell Done, MS, ACSM-CEP, Exercise Physiologist;Tyara Carol Ada, MS,ACSM CEP, Exercise Physiologist    Virtual Visit No    Medication changes reported     No    Fall or balance concerns reported    No    Tobacco Cessation No Change    Warm-up and Cool-down Performed on first and last piece of equipment    Resistance Training Performed Yes    VAD Patient? No    PAD/SET Patient? No      Pain Assessment   Currently in Pain? No/denies    Pain Score 0-No pain    Multiple Pain Sites No           Capillary Blood Glucose: No results found for this or any previous visit (from the past 24 hour(s)).   Exercise Prescription Changes - 09/26/19 1000      Response to Exercise   Blood Pressure (Admit) 112/70    Blood Pressure (Exercise) 152/80    Blood Pressure (Exit) 122/74    Heart Rate (Admit) 95 bpm    Heart Rate (Exercise) 105 bpm    Heart Rate (Exit) 87 bpm    Rating of Perceived Exertion (Exercise) 12    Perceived Dyspnea (Exercise) 0    Symptoms None    Comments Pt's first day of exercise    Duration Progress to 30 minutes of  aerobic without signs/symptoms of physical distress    Intensity THRR unchanged      Progression   Progression Continue to progress workloads to maintain intensity without signs/symptoms of  physical distress.    Average METs 3.1      Resistance Training   Training Prescription Yes    Weight 3lbs    Reps 10-15    Time 10 Minutes      Treadmill   MPH 3    Grade 0    Minutes 15    METs 3.3      NuStep   Level 3    SPM 95    Minutes 15    METs 3.2           Social History   Tobacco Use  Smoking Status Former Smoker  . Packs/day: 1.00  . Years: 20.00  . Pack years: 20.00  . Types: Cigarettes  . Quit date: 4  . Years since quitting: 31.5  Smokeless Tobacco Never Used    Goals Met:  Exercise tolerated well No report of cardiac concerns or symptoms Strength training completed today  Goals Unmet:  Not Applicable  Comments: [name] [name]  started cardiac rehab today.  Pt tolerated light exercise without difficulty. VSS, telemetry-Sinus Rhythm, first degree heart block, asymptomatic.  Medication list reconciled. Pt denies barriers to medicaiton compliance.  PSYCHOSOCIAL ASSESSMENT:  PHQ-0. Pt exhibits positive coping skills, hopeful outlook with supportive family. No psychosocial needs identified at this  time, no psychosocial interventions necessary.    Pt enjoys playing golf.   Pt oriented to exercise equipment and routine.    Understanding verbalized.   Dr. Fransico Him is Medical Director for Cardiac Rehab at [facility].

## 2019-09-28 ENCOUNTER — Other Ambulatory Visit: Payer: Self-pay

## 2019-09-28 ENCOUNTER — Encounter (HOSPITAL_COMMUNITY)
Admission: RE | Admit: 2019-09-28 | Discharge: 2019-09-28 | Disposition: A | Discharge status: Home / Self Care | Payer: Medicare Other | Source: Ambulatory Visit | Attending: Cardiology | Admitting: Cardiology

## 2019-09-28 VITALS — Wt 210.0 lb

## 2019-09-28 DIAGNOSIS — Z952 Presence of prosthetic heart valve: Secondary | ICD-10-CM

## 2019-09-28 NOTE — Progress Notes (Signed)
Resting heart rate in the low 100's sinus tach. Patient asymptomatic. [place] said he drank decaf coffee this morning. Max heart rate 115. Exit heart rate 102.Will fax exercise flow sheets to Dr. Crissie Figures office for review.Barnet Pall, RN,BSN 09/28/2019 10:38 AM

## 2019-09-28 NOTE — Progress Notes (Signed)
CIEL YANES 86 y.o. male Nutrition Note  Visit Diagnosis: 07/05/19 TAVR  Past Medical History:  Diagnosis Date  . Acid reflux   . Arthritis    "minor arthritits in hands"  . Carotid artery occlusion   . High cholesterol   . Hypertension   . Pneumonia   . 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 Severe aortic stenosis      Medications reviewed.   Current Outpatient Medications:  .  amoxicillin (AMOXIL) 500 MG tablet, Take 4 capsules (2,000 mg) one hour prior to dental visits., Disp: 8 tablet, Rfl: 12 .  aspirin EC 81 MG tablet, Take 1 tablet (81 mg total) by mouth daily., Disp: 30 tablet, Rfl: 2 .  b complex vitamins tablet, Take 1 tablet by mouth daily., Disp: , Rfl:  .  Biotin 10000 MCG TABS, Take 10,000 mcg by mouth daily., Disp: , Rfl:  .  cetirizine (ZYRTEC) 10 MG tablet, Take 10 mg by mouth daily., Disp: , Rfl:  .  cholecalciferol (VITAMIN D3) 25 MCG (1000 UNIT) tablet, Take 3,000 Units by mouth daily., Disp: , Rfl:  .  clopidogrel (PLAVIX) 75 MG tablet, Take 1 tablet (75 mg total) by mouth daily with breakfast., Disp: 90 tablet, Rfl: 1 .  furosemide (LASIX) 20 MG tablet, Take 1 tablet (20 mg total) by mouth daily., Disp: 90 tablet, Rfl: 3 .  L-Lysine 500 MG CAPS, Take 500 mg by mouth See admin instructions. Take 500 mg daily or as needed, may take 1000-1500 mg instead when experiencing a cold sore, Disp: , Rfl:  .  Misc Natural Products (OSTEO BI-FLEX ADV TRIPLE ST) TABS, Take 1 tablet by mouth daily., Disp: , Rfl:  .  Misc Natural Products (PROSTATE CONTROL PO), Take 1 tablet by mouth daily. , Disp: , Rfl:  .  Multiple Vitamin (MULTIVITAMIN WITH MINERALS) TABS tablet, Take 1 tablet by mouth daily., Disp: , Rfl:  .  rosuvastatin (CRESTOR) 40 MG tablet, Take 40 mg by mouth daily., Disp: , Rfl:  .  tadalafil (CIALIS) 20 MG tablet, Take 40 mg by mouth daily as needed for erectile dysfunction.,  Disp: , Rfl:  .  vitamin E 180 MG (400 UNITS) capsule, Take 400 Units by mouth daily., Disp: , Rfl:    Ht Readings from Last 1 Encounters:  09/20/19 5' 9.5" (1.765 m)     Wt Readings from Last 3 Encounters:  09/20/19 210 lb 15.7 oz (95.7 kg)  08/18/19 215 lb (97.5 kg)  07/13/19 218 lb (98.9 kg)     There is no height or weight on file to calculate BMI.   Social History   Tobacco Use  Smoking Status Former Smoker  . Packs/day: 1.00  . Years: 20.00  . Pack years: 20.00  . Types: Cigarettes  . Quit date: 70  . Years since quitting: 31.5  Smokeless Tobacco Never Used     No results found for: CHOL No results found for: HDL No results found for: LDLCALC No results found for: TRIG   Lab Results  Component Value Date   HGBA1C 5.5 07/01/2019     CBG (last 3)  No results for input(s): GLUCAP in the last 72 hours.   Nutrition Note  Spoke with pt. Nutrition Plan and Nutrition Survey goals reviewed with pt. Pt is following a Heart Healthy diet. Pt wants to lose wt. Pt has been trying to  lose wt by decreasing portion sizes. Wt loss tips reviewed (label reading, how to build a healthy plate, portion sizes, eating frequently across the day). Pt has lost 11 lbs since May and goal weight 200 lbs.  Pt knows to limit sodium intake. We reviewed label reading. Per discussion, pt does not use canned/convenience foods often. Pt does add salt to food but has been learning ways to decrease use of salt shaker and exploring sodium free seasoning options. Pt eats out 3x/week for lunch.  Pt expressed understanding of the information reviewed.    Nutrition Diagnosis ? Obese  I = 30-34.9 related to excessive energy intake as evidenced by a 30.57 kg/m2  Nutrition Intervention ? Pt's individual nutrition plan reviewed with pt. ? Benefits of adopting Heart Healthy diet discussed when Med[employer] reviewed. ? Continue client-centered nutrition education by RD, as part of interdisciplinary  care.  Goal(s) ? Pt to identify food quantities necessary to achieve weight loss of 5 lb at graduation from cardiac rehab.  ? Pt to read labels and limit sodium <300 mg/label.  Plan:   Will provide client-centered nutrition education as part of interdisciplinary care  Monitor and evaluate progress toward nutrition goal with team.   Michaele Offer, MS, RDN, LDN

## 2019-09-30 ENCOUNTER — Ambulatory Visit: Payer: Medicare Other

## 2019-09-30 ENCOUNTER — Encounter (HOSPITAL_COMMUNITY): Payer: Medicare Other

## 2019-09-30 ENCOUNTER — Other Ambulatory Visit: Payer: Self-pay

## 2019-09-30 ENCOUNTER — Other Ambulatory Visit: Payer: Medicare Other

## 2019-09-30 DIAGNOSIS — I6523 Occlusion and stenosis of bilateral carotid arteries: Secondary | ICD-10-CM | POA: Diagnosis not present

## 2019-10-02 ENCOUNTER — Other Ambulatory Visit: Payer: Self-pay | Admitting: Cardiology

## 2019-10-02 DIAGNOSIS — I6523 Occlusion and stenosis of bilateral carotid arteries: Secondary | ICD-10-CM

## 2019-10-03 ENCOUNTER — Encounter (HOSPITAL_COMMUNITY)
Admission: RE | Admit: 2019-10-03 | Discharge: 2019-10-03 | Disposition: A | Discharge status: Home / Self Care | Payer: Medicare Other | Source: Ambulatory Visit | Attending: Cardiology | Admitting: Cardiology

## 2019-10-03 ENCOUNTER — Other Ambulatory Visit: Payer: Self-pay

## 2019-10-03 DIAGNOSIS — Z952 Presence of prosthetic heart valve: Secondary | ICD-10-CM | POA: Insufficient documentation

## 2019-10-04 ENCOUNTER — Other Ambulatory Visit: Payer: Self-pay | Admitting: Cardiology

## 2019-10-04 DIAGNOSIS — I10 Essential (primary) hypertension: Secondary | ICD-10-CM

## 2019-10-04 DIAGNOSIS — R000 Tachycardia, unspecified: Secondary | ICD-10-CM

## 2019-10-04 MED ORDER — METOPROLOL TARTRATE 25 MG PO TABS: 12.5000 mg | ORAL_TABLET | Freq: Two times a day (BID) | ORAL | 3 refills | Status: DC

## 2019-10-04 NOTE — Progress Notes (Signed)
Looks stable overall

## 2019-10-05 ENCOUNTER — Encounter (HOSPITAL_COMMUNITY)
Admission: RE | Admit: 2019-10-05 | Discharge: 2019-10-05 | Disposition: A | Discharge status: Home / Self Care | Payer: Medicare Other | Source: Ambulatory Visit | Attending: Cardiology | Admitting: Cardiology

## 2019-10-05 ENCOUNTER — Other Ambulatory Visit: Payer: Self-pay

## 2019-10-05 DIAGNOSIS — Z952 Presence of prosthetic heart valve: Secondary | ICD-10-CM | POA: Diagnosis not present

## 2019-10-07 ENCOUNTER — Encounter (HOSPITAL_COMMUNITY): Payer: Medicare Other

## 2019-10-10 ENCOUNTER — Other Ambulatory Visit: Payer: Self-pay

## 2019-10-10 ENCOUNTER — Encounter (HOSPITAL_COMMUNITY)
Admission: RE | Admit: 2019-10-10 | Discharge: 2019-10-10 | Disposition: A | Discharge status: Home / Self Care | Payer: Medicare Other | Source: Ambulatory Visit | Attending: Cardiology | Admitting: Cardiology

## 2019-10-10 DIAGNOSIS — Z952 Presence of prosthetic heart valve: Secondary | ICD-10-CM

## 2019-10-12 ENCOUNTER — Other Ambulatory Visit: Payer: Self-pay

## 2019-10-12 ENCOUNTER — Encounter (HOSPITAL_COMMUNITY)
Admission: RE | Admit: 2019-10-12 | Discharge: 2019-10-12 | Disposition: A | Discharge status: Home / Self Care | Payer: Medicare Other | Source: Ambulatory Visit | Attending: Cardiology | Admitting: Cardiology

## 2019-10-12 DIAGNOSIS — Z952 Presence of prosthetic heart valve: Secondary | ICD-10-CM | POA: Diagnosis not present

## 2019-10-12 NOTE — Progress Notes (Signed)
Cardiac Individual Treatment Plan  Patient Details  Name: [name] [name] MRN: [id number] Date of Birth: Aug 16, 1931 Referring Provider:     [facility] from 09/20/2019 in [facility]  Referring Provider Mickel Crow MD      Initial Encounter Date:    CARDIAC REHAB PHASE II ORIENTATION from 09/20/2019 in [facility]  Date 09/20/19      Visit Diagnosis: 07/05/19 TAVR  Patient's Home Medications on Admission:  Current Outpatient Medications:  .  amoxicillin (AMOXIL) 500 MG tablet, Take 4 capsules (2,000 mg) one hour prior to dental visits., Disp: 8 tablet, Rfl: 12 .  aspirin EC 81 MG tablet, Take 1 tablet (81 mg total) by mouth daily., Disp: 30 tablet, Rfl: 2 .  b complex vitamins tablet, Take 1 tablet by mouth daily., Disp: , Rfl:  .  Biotin 10000 MCG TABS, Take 10,000 mcg by mouth daily., Disp: , Rfl:  .  cetirizine (ZYRTEC) 10 MG tablet, Take 10 mg by mouth daily., Disp: , Rfl:  .  cholecalciferol (VITAMIN D3) 25 MCG (1000 UNIT) tablet, Take 3,000 Units by mouth daily., Disp: , Rfl:  .  clopidogrel (PLAVIX) 75 MG tablet, Take 1 tablet (75 mg total) by mouth daily with breakfast., Disp: 90 tablet, Rfl: 1 .  furosemide (LASIX) 20 MG tablet, Take 1 tablet (20 mg total) by mouth daily., Disp: 90 tablet, Rfl: 3 .  L-Lysine 500 MG CAPS, Take 500 mg by mouth See admin instructions. Take 500 mg daily or as needed, may take 1000-1500 mg instead when experiencing a cold sore, Disp: , Rfl:  .  metoprolol tartrate (LOPRESSOR) 25 MG tablet, Take 0.5 tablets (12.5 mg total) by mouth 2 (two) times daily., Disp: 60 tablet, Rfl: 3 .  Misc Natural Products (OSTEO BI-FLEX ADV TRIPLE ST) TABS, Take 1 tablet by mouth daily., Disp: , Rfl:  .  Misc Natural Products (PROSTATE CONTROL PO), Take 1 tablet by mouth daily. , Disp: , Rfl:  .  Multiple Vitamin (MULTIVITAMIN WITH MINERALS) TABS tablet, Take 1 tablet by mouth  daily., Disp: , Rfl:  .  rosuvastatin (CRESTOR) 40 MG tablet, Take 40 mg by mouth daily., Disp: , Rfl:  .  tadalafil (CIALIS) 20 MG tablet, Take 40 mg by mouth daily as needed for erectile dysfunction., Disp: , Rfl:  .  vitamin E 180 MG (400 UNITS) capsule, Take 400 Units by mouth daily., Disp: , Rfl:   Past Medical History: Past Medical History:  Diagnosis Date  . Acid reflux   . Arthritis    "minor arthritits in hands"  . Carotid artery occlusion   . High cholesterol   . Hypertension   . Pneumonia   . 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 Severe aortic stenosis     Tobacco Use: Social History   Tobacco Use  Smoking Status Former Smoker  . Packs/day: 1.00  . Years: 20.00  . Pack years: 20.00  . Types: Cigarettes  . Quit date: 18  . Years since quitting: 31.6  Smokeless Tobacco Never Used    Labs: Recent Chemical engineer    Labs for ITP Cardiac and Pulmonary Rehab Latest Ref Rng & Units 07/01/2019 07/05/2019 07/05/2019 07/05/2019 07/05/2019   Hemoglobin A1c 4.8 - 5.6 % 5.5 - - - -   PHART 7.35 - 7.45 7.417 - - - -  PCO2ART 32 - 48 mmHg 40.2 - - - -   HCO3 20.0 - 28.0 mmol/L 25.4 - - - -   TCO2 22 - 32 mmol/L - 25 25 27 24    O2SAT % 98.2 - - - -      Capillary Blood Glucose: No results found for: GLUCAP   Exercise Target Goals: Exercise Program Goal: Individual exercise prescription set using results from initial 6 min walk test and THRR while considering  patient's activity barriers and safety.   Exercise Prescription Goal: Starting with aerobic activity 30 plus minutes a day, 3 days per week for initial exercise prescription. Provide home exercise prescription and guidelines that participant acknowledges understanding prior to discharge.  Activity Barriers & Risk Stratification:  Activity Barriers & Cardiac Risk Stratification - 09/20/19 0922      Activity Barriers &  Cardiac Risk Stratification   Activity Barriers Arthritis   Arthritis: right knee, back, right wrist and hand.   Cardiac Risk Stratification High           6 Minute Walk:  6 Minute Walk    Row Name 09/20/19 0933         6 Minute Walk   Phase Initial     Distance 1570 feet     Walk Time 6 minutes     # of Rest Breaks 0     MPH 2.97     METS 2.42     RPE 11     Perceived Dyspnea  0     VO2 Peak 8.5     Symptoms No     Resting HR 92 bpm     Resting BP 144/70     Resting Oxygen Saturation  97 %     Exercise Oxygen Saturation  during 6 min walk 96 %     Max Ex. HR 113 bpm     Max Ex. BP 120/82     2 Minute Post BP 122/80            Oxygen Initial Assessment:   Oxygen Re-Evaluation:   Oxygen Discharge (Final Oxygen Re-Evaluation):   Initial Exercise Prescription:  Initial Exercise Prescription - 09/20/19 1000      Date of Initial Exercise RX and Referring Provider   Date 09/20/19    Referring Provider Vernell Leep I. MD    Expected Discharge Date 11/18/19      Treadmill   MPH 1.7    Grade 0    Minutes 15    METs 2.3      NuStep   Level 3    SPM 85    Minutes 15    METs 2.3      Prescription Details   Frequency (times per week) 2    Duration Progress to 30 minutes of continuous aerobic without signs/symptoms of physical distress      Intensity   THRR 40-80% of Max Heartrate 54-108    Ratings of Perceived Exertion 11-13    Perceived Dyspnea 0-4      Progression   Progression Continue to progress workloads to maintain intensity without signs/symptoms of physical distress.      Resistance Training   Training Prescription Yes    Weight 3lbs    Reps 10-15           Perform Capillary Blood Glucose checks as needed.  Exercise Prescription Changes:   Exercise Prescription Changes    Row Name 09/26/19 1000 10/12/19 1214  Response to Exercise   Blood Pressure (Admit) 112/70 114/78      Blood Pressure (Exercise) 152/80  140/80      Blood Pressure (Exit) 122/74 110/72      Heart Rate (Admit) 95 bpm 69 bpm      Heart Rate (Exercise) 105 bpm 93 bpm      Heart Rate (Exit) 87 bpm 69 bpm      Rating of Perceived Exertion (Exercise) 12 12      Perceived Dyspnea (Exercise) 0 0      Symptoms None None      Comments Pt's first day of exercise None      Duration Progress to 30 minutes of  aerobic without signs/symptoms of physical distress Continue with 30 min of aerobic exercise without signs/symptoms of physical distress.      Intensity THRR unchanged THRR unchanged        Progression   Progression Continue to progress workloads to maintain intensity without signs/symptoms of physical distress. Continue to progress workloads to maintain intensity without signs/symptoms of physical distress.      Average METs 3.1 3.2        Resistance Training   Training Prescription Yes Yes      Weight 3lbs 5lbs      Reps 10-15 10-15      Time 10 Minutes 10 Minutes        Interval Training   Interval Training -- No        Treadmill   MPH 3 3      Grade 0 0      Minutes 15 15      METs 3.3 3.3        NuStep   Level 3 3      SPM 95 95      Minutes 15 15      METs 3.2 3.2             Exercise Comments:   Exercise Comments    Row Name 09/26/19 1043 10/13/19 1215         Exercise Comments Pt's first day of exercise. Pt responded well to workload increaes. Will continue to monitor and progress pt as tolerated. Pt responding well to exercise prescription. Will follow up with pt regarding home exercise plan.             Exercise Goals and Review:   Exercise Goals    Row Name 09/20/19 5784             Exercise Goals   Increase Physical Activity Yes       Intervention Provide advice, education, support and counseling about physical activity/exercise needs.;Develop an individualized exercise prescription for aerobic and resistive training based on initial evaluation findings, risk stratification,  comorbidities and participant's personal goals.       Expected Outcomes Short Term: Attend rehab on a regular basis to increase amount of physical activity.;Long Term: Exercising regularly at least 3-5 days a week.;Long Term: Add in home exercise to make exercise part of routine and to increase amount of physical activity.       Increase Strength and Stamina Yes       Intervention Provide advice, education, support and counseling about physical activity/exercise needs.;Develop an individualized exercise prescription for aerobic and resistive training based on initial evaluation findings, risk stratification, comorbidities and participant's personal goals.       Expected Outcomes Short Term: Increase workloads from initial exercise prescription for resistance, speed, and METs.;Short  Term: Perform resistance training exercises routinely during rehab and add in resistance training at home;Long Term: Improve cardiorespiratory fitness, muscular endurance and strength as measured by increased METs and functional capacity (6MWT)       Able to understand and use rate of perceived exertion (RPE) scale Yes       Intervention Provide education and explanation on how to use RPE scale       Expected Outcomes Short Term: Able to use RPE daily in rehab to express subjective intensity level;Long Term:  Able to use RPE to guide intensity level when exercising independently       Knowledge and understanding of Target Heart Rate Range (THRR) Yes       Intervention Provide education and explanation of THRR including how the numbers were predicted and where they are located for reference       Expected Outcomes Short Term: Able to state/look up THRR;Long Term: Able to use THRR to govern intensity when exercising independently;Short Term: Able to use daily as guideline for intensity in rehab       Able to check pulse independently Yes       Intervention Provide education and demonstration on how to check pulse in carotid and  radial arteries.;Review the importance of being able to check your own pulse for safety during independent exercise       Expected Outcomes Short Term: Able to explain why pulse checking is important during independent exercise;Long Term: Able to check pulse independently and accurately       Understanding of Exercise Prescription Yes       Intervention Provide education, explanation, and written materials on patient's individual exercise prescription       Expected Outcomes Short Term: Able to explain program exercise prescription;Long Term: Able to explain home exercise prescription to exercise independently              Exercise Goals Re-Evaluation :  Exercise Goals Re-Evaluation    [place] Name 09/26/19 1044             Exercise Goal Re-Evaluation   Exercise Goals Review Increase Physical Activity;Understanding of Exercise Prescription;Increase Strength and Stamina;Knowledge and understanding of Target Heart Rate Range (THRR);Able to understand and use rate of perceived exertion (RPE) scale;Able to check pulse independently       Comments Pt's first day of exercise. Pt oriented to exercise equipment. Will continue to monitor pt and progress.       Expected Outcomes Pt will continue to increae strength and stamina.               Discharge Exercise Prescription (Final Exercise Prescription Changes):  Exercise Prescription Changes - 10/12/19 1214      Response to Exercise   Blood Pressure (Admit) 114/78    Blood Pressure (Exercise) 140/80    Blood Pressure (Exit) 110/72    Heart Rate (Admit) 69 bpm    Heart Rate (Exercise) 93 bpm    Heart Rate (Exit) 69 bpm    Rating of Perceived Exertion (Exercise) 12    Perceived Dyspnea (Exercise) 0    Symptoms None    Comments None    Duration Continue with 30 min of aerobic exercise without signs/symptoms of physical distress.    Intensity THRR unchanged      Progression   Progression Continue to progress workloads to maintain  intensity without signs/symptoms of physical distress.    Average METs 3.2      Resistance Training   Training Prescription  Yes    Weight 5lbs    Reps 10-15    Time 10 Minutes      Interval Training   Interval Training No      Treadmill   MPH 3    Grade 0    Minutes 15    METs 3.3      NuStep   Level 3    SPM 95    Minutes 15    METs 3.2           Nutrition:  Target Goals: Understanding of nutrition guidelines, daily intake of sodium 1500mg , cholesterol 200mg , calories 30% from fat and 7% or less from saturated fats, daily to have 5 or more servings of fruits and vegetables.  Biometrics:  Pre Biometrics - 09/20/19 0904      Pre Biometrics   Height 5' 9.5" (1.765 m)    Weight 95.7 kg    Waist Circumference 41.5 inches    Hip Circumference 45.5 inches    Waist to Hip Ratio 0.91 %    BMI (Calculated) 30.72    Triceps Skinfold 18.5 mm    % Body Fat 30.6 %    Grip Strength 29.5 kg    Flexibility 0 in    Single Leg Stand 4.93 seconds            Nutrition Therapy Plan and Nutrition Goals:  Nutrition Therapy & Goals - 09/28/19 0959      Nutrition Therapy   Diet Heart healthy      Personal Nutrition Goals   Nutrition Goal Pt to identify food quantities necessary to achieve weight loss of 5 lb at graduation from cardiac rehab.    Personal Goal #2 Pt to read labels and limit sodium <300 mg/label.      Intervention Plan   Intervention Prescribe, educate and counsel regarding individualized specific dietary modifications aiming towards targeted core components such as weight, hypertension, lipid management, diabetes, heart failure and other comorbidities.;Nutrition handout(s) given to patient.    Expected Outcomes Short Term Goal: A plan has been developed with personal nutrition goals set during dietitian appointment.           Nutrition Assessments:  Nutrition Assessments - 09/28/19 1000      MEDFICTS Scores   Pre Score 46           Nutrition  Goals Re-Evaluation:  Nutrition Goals Re-Evaluation    Row Name 09/28/19 1000             Goals   Current Weight 210 lb (95.3 kg)       Nutrition Goal Pt to identify food quantities necessary to achieve weight loss of 5 lb at graduation from cardiac rehab.         Personal Goal #2 Re-Evaluation   Personal Goal #2 Pt to read labels and limit sodium <300 mg/label.              Nutrition Goals Discharge (Final Nutrition Goals Re-Evaluation):  Nutrition Goals Re-Evaluation - 09/28/19 1000      Goals   Current Weight 210 lb (95.3 kg)    Nutrition Goal Pt to identify food quantities necessary to achieve weight loss of 5 lb at graduation from cardiac rehab.      Personal Goal #2 Re-Evaluation   Personal Goal #2 Pt to read labels and limit sodium <300 mg/label.           Psychosocial: Target Goals: Acknowledge presence or absence of significant depression and/or stress, maximize  coping skills, provide positive support system. Participant is able to verbalize types and ability to use techniques and skills needed for reducing stress and depression.  Initial Review & Psychosocial Screening:  Initial Psych Review & Screening - 09/20/19 1206      Initial Review   Current issues with None Identified      Family Dynamics   Good Support System? Yes   [name] [name] has his wife and two children for support     Barriers   Psychosocial barriers to participate in program There are no identifiable barriers or psychosocial needs.      Screening Interventions   Interventions Encouraged to exercise           Quality of Life Scores:  Quality of Life - 09/20/19 1047      Quality of Life   Select Quality of Life      Quality of Life Scores   Health/Function Pre 29.67 %    Socioeconomic Pre 27.5 %    Psych/Spiritual Pre 29.64 %    Family Pre 29.5 %    GLOBAL Pre 29.19 %          Scores of 19 and below usually indicate a poorer quality of life in these areas.  A difference of  2-3  points is a clinically meaningful difference.  A difference of 2-3 points in the total score of the Quality of Life Index has been associated with significant improvement in overall quality of life, self-image, physical symptoms, and general health in studies assessing change in quality of life.  PHQ-9: Recent Review Flowsheet Data    Depression screen [facility] 2/9 09/20/2019   Decreased Interest 0   Down, Depressed, Hopeless 0   PHQ - 2 Score 0     Interpretation of Total Score  Total Score Depression Severity:  1-4 = Minimal depression, 5-9 = Mild depression, 10-14 = Moderate depression, 15-19 = Moderately severe depression, 20-27 = Severe depression   Psychosocial Evaluation and Intervention:   Psychosocial Re-Evaluation:  Psychosocial Re-Evaluation    [place] Name 10/12/19 1024             Psychosocial Re-Evaluation   Current issues with Current Stress Concerns       Comments Inmer voices having some concerns about his wife's health as his wife has parkinson's       Expected Outcomes will continue to monitor and offer support as needed       Interventions Stress management education;Encouraged to attend Cardiac Rehabilitation for the exercise       Continue Psychosocial Services  No Follow up required         Initial Review   Source of Stress Concerns Family              Psychosocial Discharge (Final Psychosocial Re-Evaluation):  Psychosocial Re-Evaluation - 10/12/19 1024      Psychosocial Re-Evaluation   Current issues with Current Stress Concerns    Comments [name] [name] voices having some concerns about his wife's health as his wife has parkinson's    Expected Outcomes will continue to monitor and offer support as needed    Interventions Stress management education;Encouraged to attend Cardiac Rehabilitation for the exercise    Continue Psychosocial Services  No Follow up required      Initial Review   Source of Stress Concerns Family           Vocational  Rehabilitation: Provide vocational rehab assistance to qualifying candidates.   Vocational Rehab Evaluation & Intervention:  Vocational Rehab - 09/20/19 1207      Initial Vocational Rehab Evaluation & Intervention   Assessment shows need for Vocational Rehabilitation No   [name] [name] is retired and does not need vocational rehab at this time          Education: Education Goals: Education classes will be provided on a weekly basis, covering required topics. Participant will state understanding/return demonstration of topics presented.  Learning Barriers/Preferences:   Education Topics: Hypertension, Hypertension Reduction -Define heart disease and high blood pressure. Discus how high blood pressure affects the body and ways to reduce high blood pressure.   Exercise and Your Heart -Discuss why it is important to exercise, the FITT principles of exercise, normal and abnormal responses to exercise, and how to exercise safely.   Angina -Discuss definition of angina, causes of angina, treatment of angina, and how to decrease risk of having angina.   Cardiac Medications -Review what the following cardiac medications are used for, how they affect the body, and side effects that may occur when taking the medications.  Medications include Aspirin, Beta blockers, calcium channel blockers, ACE Inhibitors, angiotensin receptor blockers, diuretics, digoxin, and antihyperlipidemics.   Congestive Heart Failure -Discuss the definition of CHF, how to live with CHF, the signs and symptoms of CHF, and how keep track of weight and sodium intake.   Heart Disease and Intimacy -Discus the effect sexual activity has on the heart, how changes occur during intimacy as we age, and safety during sexual activity.   Smoking Cessation / COPD -Discuss different methods to quit smoking, the health benefits of quitting smoking, and the definition of COPD.   Nutrition I: Fats -Discuss the types of  cholesterol, what cholesterol does to the heart, and how cholesterol levels can be controlled.   Nutrition II: Labels -Discuss the different components of food labels and how to read food label   Heart Parts/Heart Disease and PAD -Discuss the anatomy of the heart, the pathway of blood circulation through the heart, and these are affected by heart disease.   Stress I: Signs and Symptoms -Discuss the causes of stress, how stress may lead to anxiety and depression, and ways to limit stress.   Stress II: Relaxation -Discuss different types of relaxation techniques to limit stress.   Warning Signs of Stroke / TIA -Discuss definition of a stroke, what the signs and symptoms are of a stroke, and how to identify when someone is having stroke.   Knowledge Questionnaire Score:  Knowledge Questionnaire Score - 09/20/19 1048      Knowledge Questionnaire Score   Pre Score 16/24           Core Components/Risk Factors/Patient Goals at Admission:  Personal Goals and Risk Factors at Admission - 09/20/19 1048      Core Components/Risk Factors/Patient Goals on Admission    Weight Management Yes;Obesity    Intervention Weight Management/Obesity: Establish reasonable short term and long term weight goals.;Obesity: Provide education and appropriate resources to help participant work on and attain dietary goals.    Admit Weight 210 lb 15.7 oz (95.7 kg)    Expected Outcomes Short Term: Continue to assess and modify interventions until short term weight is achieved;Long Term: Adherence to nutrition and physical activity/exercise program aimed toward attainment of established weight goal    Hypertension Yes    Intervention Provide education on lifestyle modifcations including regular physical activity/exercise, weight management, moderate sodium restriction and increased consumption of fresh fruit, vegetables, and low fat dairy, alcohol moderation, and  smoking cessation.;Monitor prescription use  compliance.    Expected Outcomes Short Term: Continued assessment and intervention until BP is < 140/65mm HG in hypertensive participants. < 130/3mm HG in hypertensive participants with diabetes, heart failure or chronic kidney disease.;Long Term: Maintenance of blood pressure at goal levels.    Lipids Yes    Intervention Provide education and support for participant on nutrition & aerobic/resistive exercise along with prescribed medications to achieve LDL 70mg , HDL >40mg .    Expected Outcomes Short Term: Participant states understanding of desired cholesterol values and is compliant with medications prescribed. Participant is following exercise prescription and nutrition guidelines.;Long Term: Cholesterol controlled with medications as prescribed, with individualized exercise RX and with personalized nutrition plan. Value goals: LDL < 70mg , HDL > 40 mg.           Core Components/Risk Factors/Patient Goals Review:   Goals and Risk Factor Review    Row Name 10/12/19 1037             Core Components/Risk Factors/Patient Goals Review   Personal Goals Review Weight Management/Obesity;Hypertension;Lipids;Stress       Review [name] [name] has been doing well with exercise at cardiac rehab. [name] [name]'s resting heart rate has been improved since [name] [name] was started on a low dose beta blocker.       Expected Outcomes [name] [name] will continue to participate in phase 2 cardiac rehab for exercise, nutrition and lifestyle modifications              Core Components/Risk Factors/Patient Goals at Discharge (Final Review):   Goals and Risk Factor Review - 10/12/19 1037      Core Components/Risk Factors/Patient Goals Review   Personal Goals Review Weight Management/Obesity;Hypertension;Lipids;Stress    Review [name] [name] has been doing well with exercise at cardiac rehab. [name] [name]'s resting heart rate has been improved since [name] [name] was started on a low dose beta blocker.    Expected Outcomes [name] [name] will continue to participate  in phase 2 cardiac rehab for exercise, nutrition and lifestyle modifications           ITP Comments:  ITP Comments    Row Name 09/20/19 0905 10/12/19 1022         ITP Comments Medical Director- Dr. Fransico Him, MD 30 Day ITP Review. Patient with good attendance and participation in phase 2 cardiac rehab. Z[place] attends exercise twice a week.             Comments: See ITP comments.Barnet Pall, RN,BSN 10/13/2019 1:08 PM

## 2019-10-14 ENCOUNTER — Encounter (HOSPITAL_COMMUNITY): Payer: Medicare Other

## 2019-10-17 ENCOUNTER — Encounter (HOSPITAL_COMMUNITY): Payer: Medicare Other

## 2019-10-19 ENCOUNTER — Encounter (HOSPITAL_COMMUNITY): Payer: Medicare Other

## 2019-10-21 ENCOUNTER — Encounter (HOSPITAL_COMMUNITY): Payer: Medicare Other

## 2019-10-24 ENCOUNTER — Other Ambulatory Visit: Payer: Self-pay

## 2019-10-24 ENCOUNTER — Encounter (HOSPITAL_COMMUNITY)
Admission: RE | Admit: 2019-10-24 | Discharge: 2019-10-24 | Disposition: A | Discharge status: Home / Self Care | Payer: Medicare Other | Source: Ambulatory Visit | Attending: Cardiology | Admitting: Cardiology

## 2019-10-24 DIAGNOSIS — Z952 Presence of prosthetic heart valve: Secondary | ICD-10-CM

## 2019-10-24 NOTE — Progress Notes (Signed)
I have reviewed a Home Exercise Prescription with [name] [name] . [name] [name] is currently exercising at home. The patient was advised to continue to walk 2-3 days a week for 30-45 minutes.  Sonia Side and I discussed how to progress their exercise prescription. The patient stated that they understand the exercise prescription. We reviewed exercise guidelines, target heart rate during exercise, RPE Scale, weather conditions, NTG use, endpoints for exercise, warmup and cool down. Patient is encouraged to come to me with any questions. I will continue to follow up with the patient to assist them with progression and safety.    Carma Lair MS, CEP CSCS 2:20 PM 10/24/2019

## 2019-10-24 NOTE — Progress Notes (Signed)
Nutrition Note - Follow up  Spoke with pt during exercise. He has maintained his weight of 205 lbs. He continues to exercise and cook at home. He has been looking for easy ways to prepare meals since he is the primary cook at home. He always adds veggies to meals and often chooses lean meats. He incorporates kimchi often. Overall he is satisfied with diet. He enjoys ice cream in the evening. He had lost 11 lbs prior to rehab. He feels ok with his current weight and his current diet.   Will continue to monitor pt during cardiac rehab.  Nutrition Diagnosis   Obese  I = 30-34.9 related to excessive energy intake as evidenced by a 30.57 kg/m2  Nutrition Intervention   Pt's individual nutrition plan reviewed with pt.  Benefits of adopting Heart Healthy diet discussed when [employer] reviewed.  Continue client-centered nutrition education by RD, as part of interdisciplinary care.  Goal(s)  Pt to identify food quantities necessary to achieve weight loss of 5 lb at graduation from cardiac rehab.   Pt to read labels and limit sodium <300 mg/label.  Plan:  Will provide client-centered nutrition education as part of interdisciplinary care  Monitor and evaluate progress toward nutrition goal with team.   Michaele Offer, MS, RDN, LDN

## 2019-10-26 ENCOUNTER — Encounter (HOSPITAL_COMMUNITY)
Admission: RE | Admit: 2019-10-26 | Discharge: 2019-10-26 | Disposition: A | Discharge status: Home / Self Care | Payer: Medicare Other | Source: Ambulatory Visit | Attending: Cardiology | Admitting: Cardiology

## 2019-10-26 ENCOUNTER — Other Ambulatory Visit: Payer: Self-pay

## 2019-10-26 DIAGNOSIS — Z952 Presence of prosthetic heart valve: Secondary | ICD-10-CM | POA: Diagnosis not present

## 2019-10-28 ENCOUNTER — Encounter (HOSPITAL_COMMUNITY): Payer: Medicare Other

## 2019-10-31 ENCOUNTER — Other Ambulatory Visit: Payer: Self-pay

## 2019-10-31 ENCOUNTER — Encounter (HOSPITAL_COMMUNITY)
Admission: RE | Admit: 2019-10-31 | Discharge: 2019-10-31 | Disposition: A | Discharge status: Home / Self Care | Payer: Medicare Other | Source: Ambulatory Visit | Attending: Cardiology | Admitting: Cardiology

## 2019-10-31 DIAGNOSIS — Z952 Presence of prosthetic heart valve: Secondary | ICD-10-CM

## 2019-11-02 ENCOUNTER — Other Ambulatory Visit: Payer: Self-pay

## 2019-11-02 ENCOUNTER — Encounter (HOSPITAL_COMMUNITY)
Admission: RE | Admit: 2019-11-02 | Discharge: 2019-11-02 | Disposition: A | Discharge status: Home / Self Care | Payer: Medicare Other | Source: Ambulatory Visit | Attending: Cardiology | Admitting: Cardiology

## 2019-11-02 DIAGNOSIS — Z952 Presence of prosthetic heart valve: Secondary | ICD-10-CM | POA: Diagnosis not present

## 2019-11-04 ENCOUNTER — Encounter (HOSPITAL_COMMUNITY): Payer: Medicare Other

## 2019-11-08 DIAGNOSIS — H532 Diplopia: Secondary | ICD-10-CM | POA: Diagnosis not present

## 2019-11-08 DIAGNOSIS — H26493 Other secondary cataract, bilateral: Secondary | ICD-10-CM | POA: Diagnosis not present

## 2019-11-08 DIAGNOSIS — H52203 Unspecified astigmatism, bilateral: Secondary | ICD-10-CM | POA: Diagnosis not present

## 2019-11-08 DIAGNOSIS — H31001 Unspecified chorioretinal scars, right eye: Secondary | ICD-10-CM | POA: Diagnosis not present

## 2019-11-09 ENCOUNTER — Other Ambulatory Visit: Payer: Self-pay

## 2019-11-09 ENCOUNTER — Encounter (HOSPITAL_COMMUNITY)
Admission: RE | Admit: 2019-11-09 | Discharge: 2019-11-09 | Disposition: A | Discharge status: Home / Self Care | Payer: Medicare Other | Source: Ambulatory Visit | Attending: Cardiology | Admitting: Cardiology

## 2019-11-09 DIAGNOSIS — Z952 Presence of prosthetic heart valve: Secondary | ICD-10-CM

## 2019-11-09 NOTE — Progress Notes (Signed)
Cardiac Individual Treatment Plan  Patient Details  Name: [name] [name] MRN: [id number] Date of Birth: [birth year] Referring Provider:     [facility] from 09/20/2019 in [place]  Referring Provider Mickel Crow MD      Initial Encounter Date:    CARDIAC REHAB PHASE II ORIENTATION from 09/20/2019 in [place]  Date 09/20/19      Visit Diagnosis: 07/05/19 TAVR  Patient's Home Medications on Admission:  Current Outpatient Medications:  .  amoxicillin (AMOXIL) 500 MG tablet, Take 4 capsules (2,000 mg) one hour prior to dental visits., Disp: 8 tablet, Rfl: 12 .  aspirin EC 81 MG tablet, Take 1 tablet (81 mg total) by mouth daily., Disp: 30 tablet, Rfl: 2 .  b complex vitamins tablet, Take 1 tablet by mouth daily., Disp: , Rfl:  .  Biotin 10000 MCG TABS, Take 10,000 mcg by mouth daily., Disp: , Rfl:  .  cetirizine (ZYRTEC) 10 MG tablet, Take 10 mg by mouth daily., Disp: , Rfl:  .  cholecalciferol (VITAMIN D3) 25 MCG (1000 UNIT) tablet, Take 3,000 Units by mouth daily., Disp: , Rfl:  .  clopidogrel (PLAVIX) 75 MG tablet, Take 1 tablet (75 mg total) by mouth daily with breakfast., Disp: 90 tablet, Rfl: 1 .  furosemide (LASIX) 20 MG tablet, Take 1 tablet (20 mg total) by mouth daily., Disp: 90 tablet, Rfl: 3 .  L-Lysine 500 MG CAPS, Take 500 mg by mouth See admin instructions. Take 500 mg daily or as needed, may take 1000-1500 mg instead when experiencing a cold sore, Disp: , Rfl:  .  metoprolol tartrate (LOPRESSOR) 25 MG tablet, Take 0.5 tablets (12.5 mg total) by mouth 2 (two) times daily., Disp: 60 tablet, Rfl: 3 .  Misc Natural Products (OSTEO BI-FLEX ADV TRIPLE ST) TABS, Take 1 tablet by mouth daily., Disp: , Rfl:  .  Misc Natural Products (PROSTATE CONTROL PO), Take 1 tablet by mouth daily. , Disp: , Rfl:  .  Multiple Vitamin (MULTIVITAMIN WITH MINERALS) TABS tablet, Take 1 tablet by mouth  daily., Disp: , Rfl:  .  rosuvastatin (CRESTOR) 40 MG tablet, Take 40 mg by mouth daily., Disp: , Rfl:  .  tadalafil (CIALIS) 20 MG tablet, Take 40 mg by mouth daily as needed for erectile dysfunction., Disp: , Rfl:  .  vitamin E 180 MG (400 UNITS) capsule, Take 400 Units by mouth daily., Disp: , Rfl:   Past Medical History: Past Medical History:  Diagnosis Date  . Acid reflux   . Arthritis    "minor arthritits in hands"  . Carotid artery occlusion   . High cholesterol   . Hypertension   . Pneumonia   . 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 Severe aortic stenosis     Tobacco Use: Social History   Tobacco Use  Smoking Status Former Smoker  . Packs/day: 1.00  . Years: 20.00  . Pack years: 20.00  . Types: Cigarettes  . Quit date: 76  . Years since quitting: 31.7  Smokeless Tobacco Never Used    Labs: Recent Chemical engineer    Labs for ITP Cardiac and Pulmonary Rehab Latest Ref Rng & Units 07/01/2019 07/05/2019 07/05/2019 07/05/2019 07/05/2019   Hemoglobin A1c 4.8 - 5.6 % 5.5 - - - -   PHART 7.35 - 7.45 7.417 - - - -  PCO2ART 32 - 48 mmHg 40.2 - - - -   HCO3 20.0 - 28.0 mmol/L 25.4 - - - -   TCO2 22 - 32 mmol/L - 25 25 27 24    O2SAT % 98.2 - - - -      Capillary Blood Glucose: No results found for: GLUCAP   Exercise Target Goals: Exercise Program Goal: Individual exercise prescription set using results from initial 6 min walk test and THRR while considering  patient's activity barriers and safety.   Exercise Prescription Goal: Starting with aerobic activity 30 plus minutes a day, 3 days per week for initial exercise prescription. Provide home exercise prescription and guidelines that participant acknowledges understanding prior to discharge.  Activity Barriers & Risk Stratification:  Activity Barriers & Cardiac Risk Stratification - 09/20/19 0922      Activity Barriers &  Cardiac Risk Stratification   Activity Barriers Arthritis   Arthritis: right knee, back, right wrist and hand.   Cardiac Risk Stratification High           6 Minute Walk:  6 Minute Walk    Row Name 09/20/19 0933         6 Minute Walk   Phase Initial     Distance 1570 feet     Walk Time 6 minutes     # of Rest Breaks 0     MPH 2.97     METS 2.42     RPE 11     Perceived Dyspnea  0     VO2 Peak 8.5     Symptoms No     Resting HR 92 bpm     Resting BP 144/70     Resting Oxygen Saturation  97 %     Exercise Oxygen Saturation  during 6 min walk 96 %     Max Ex. HR 113 bpm     Max Ex. BP 120/82     2 Minute Post BP 122/80            Oxygen Initial Assessment:   Oxygen Re-Evaluation:   Oxygen Discharge (Final Oxygen Re-Evaluation):   Initial Exercise Prescription:  Initial Exercise Prescription - 09/20/19 1000      Date of Initial Exercise RX and Referring Provider   Date 09/20/19    Referring Provider Vernell Leep I. MD    Expected Discharge Date 11/18/19      Treadmill   MPH 1.7    Grade 0    Minutes 15    METs 2.3      NuStep   Level 3    SPM 85    Minutes 15    METs 2.3      Prescription Details   Frequency (times per week) 2    Duration Progress to 30 minutes of continuous aerobic without signs/symptoms of physical distress      Intensity   THRR 40-80% of Max Heartrate 54-108    Ratings of Perceived Exertion 11-13    Perceived Dyspnea 0-4      Progression   Progression Continue to progress workloads to maintain intensity without signs/symptoms of physical distress.      Resistance Training   Training Prescription Yes    Weight 3lbs    Reps 10-15           Perform Capillary Blood Glucose checks as needed.  Exercise Prescription Changes:   Exercise Prescription Changes    Row Name 09/26/19 1000 10/12/19 1214 10/24/19 1400 11/02/19 1505 11/09/19 1317  Response to Exercise   Blood Pressure (Admit) 112/70 114/78 117/80  130/80 110/68   Blood Pressure (Exercise) 152/80 140/80 134/80 152/80 128/80   Blood Pressure (Exit) 122/74 110/72 110/70 106/60 118/70   Heart Rate (Admit) 95 bpm 69 bpm 75 bpm 78 bpm 83 bpm   Heart Rate (Exercise) 105 bpm 93 bpm 103 bpm 109 bpm 118 bpm   Heart Rate (Exit) 87 bpm 69 bpm 72 bpm 76 bpm 82 bpm   Rating of Perceived Exertion (Exercise) 12 12 12 12 12    Perceived Dyspnea (Exercise) 0 0 0 0 0   Symptoms None None None None None   Comments Pt's first day of exercise None Reviewed Home Exercise  None None   Duration Progress to 30 minutes of  aerobic without signs/symptoms of physical distress Continue with 30 min of aerobic exercise without signs/symptoms of physical distress. Continue with 30 min of aerobic exercise without signs/symptoms of physical distress. Continue with 30 min of aerobic exercise without signs/symptoms of physical distress. Continue with 30 min of aerobic exercise without signs/symptoms of physical distress.   Intensity THRR unchanged THRR unchanged THRR unchanged THRR unchanged THRR unchanged     Progression   Progression Continue to progress workloads to maintain intensity without signs/symptoms of physical distress. Continue to progress workloads to maintain intensity without signs/symptoms of physical distress. Continue to progress workloads to maintain intensity without signs/symptoms of physical distress. Continue to progress workloads to maintain intensity without signs/symptoms of physical distress. Continue to progress workloads to maintain intensity without signs/symptoms of physical distress.   Average METs 3.1 3.2 3 3.9 3.2     Resistance Training   Training Prescription Yes Yes Yes No Yes   Weight 3lbs 5lbs 5lbs -- 5lbs   Reps 10-15 10-15 10-15 -- 10-15   Time 10 Minutes 10 Minutes 10 Minutes -- 10 Minutes     Interval Training   Interval Training -- No No No No     Treadmill   MPH 3 3 3  3.2 3.2   Grade 0 0 0 0 0   Minutes 15 15 15 15 15     METs 3.3 3.3 3.3 3.45 3.45     NuStep   Level 3 3 3 3 3    SPM 95 95 90 100 100   Minutes 15 15 15 15 15    METs 3.2 3.2 2.6 3.2 2.9     Home Exercise Plan   Plans to continue exercise at -- -- Home (comment)  Walking Home (comment)  Walking Home (comment)  Walking   Frequency -- -- Add 2 additional days to program exercise sessions. Add 3 additional days to program exercise sessions. Add 3 additional days to program exercise sessions.   Initial Home Exercises Provided -- -- 10/24/19 10/24/19 10/24/19          Exercise Comments:   Exercise Comments    Row Name 09/26/19 1043 10/13/19 1215 10/24/19 1413 11/10/19 1318     Exercise Comments Pt's first day of exercise. Pt responded well to workload increaes. Will continue to monitor and progress pt as tolerated. Pt responding well to exercise prescription. Will follow up with pt regarding home exercise plan. Reviewed Home Exercise Plan with pt. Pt is currently exercising at home 2-3 days a week. Pt states he is still able to have time for exercise despite helping take care of wife, who suffers from Roberts. Will continue to follow up with pt regarding home exercise. Reviewed METs and goals with pt.  Pt is continuing to respond well to exercise and workloads. Will continue to monitor.           Exercise Goals and Review:   Exercise Goals    Row Name 09/20/19 1540             Exercise Goals   Increase Physical Activity Yes       Intervention Provide advice, education, support and counseling about physical activity/exercise needs.;Develop an individualized exercise prescription for aerobic and resistive training based on initial evaluation findings, risk stratification, comorbidities and participant's personal goals.       Expected Outcomes Short Term: Attend rehab on a regular basis to increase amount of physical activity.;Long Term: Exercising regularly at least 3-5 days a week.;Long Term: Add in home exercise to make exercise part  of routine and to increase amount of physical activity.       Increase Strength and Stamina Yes       Intervention Provide advice, education, support and counseling about physical activity/exercise needs.;Develop an individualized exercise prescription for aerobic and resistive training based on initial evaluation findings, risk stratification, comorbidities and participant's personal goals.       Expected Outcomes Short Term: Increase workloads from initial exercise prescription for resistance, speed, and METs.;Short Term: Perform resistance training exercises routinely during rehab and add in resistance training at home;Long Term: Improve cardiorespiratory fitness, muscular endurance and strength as measured by increased METs and functional capacity (6MWT)       Able to understand and use rate of perceived exertion (RPE) scale Yes       Intervention Provide education and explanation on how to use RPE scale       Expected Outcomes Short Term: Able to use RPE daily in rehab to express subjective intensity level;Long Term:  Able to use RPE to guide intensity level when exercising independently       Knowledge and understanding of Target Heart Rate Range (THRR) Yes       Intervention Provide education and explanation of THRR including how the numbers were predicted and where they are located for reference       Expected Outcomes Short Term: Able to state/look up THRR;Long Term: Able to use THRR to govern intensity when exercising independently;Short Term: Able to use daily as guideline for intensity in rehab       Able to check pulse independently Yes       Intervention Provide education and demonstration on how to check pulse in carotid and radial arteries.;Review the importance of being able to check your own pulse for safety during independent exercise       Expected Outcomes Short Term: Able to explain why pulse checking is important during independent exercise;Long Term: Able to check pulse  independently and accurately       Understanding of Exercise Prescription Yes       Intervention Provide education, explanation, and written materials on patient's individual exercise prescription       Expected Outcomes Short Term: Able to explain program exercise prescription;Long Term: Able to explain home exercise prescription to exercise independently              Exercise Goals Re-Evaluation :  Exercise Goals Re-Evaluation    Row Name 09/26/19 1044 10/24/19 1418 11/10/19 1319         Exercise Goal Re-Evaluation   Exercise Goals Review Increase Physical Activity;Understanding of Exercise Prescription;Increase Strength and Stamina;Knowledge and understanding of Target Heart Rate Range (THRR);Able to understand and use  rate of perceived exertion (RPE) scale;Able to check pulse independently Increase Physical Activity;Understanding of Exercise Prescription;Increase Strength and Stamina;Knowledge and understanding of Target Heart Rate Range (THRR);Able to understand and use rate of perceived exertion (RPE) scale;Able to check pulse independently Increase Physical Activity;Understanding of Exercise Prescription;Increase Strength and Stamina;Knowledge and understanding of Target Heart Rate Range (THRR);Able to understand and use rate of perceived exertion (RPE) scale;Able to check pulse independently     Comments Pt's first day of exercise. Pt oriented to exercise equipment. Will continue to monitor pt and progress. Reviewed HEP with pt. Also discussed THR, RPE Scale, weather precautions, NTG use, endpoints of exercise, warmup and cool down. Pt is able to exercise 30 minutes with not difficulty. Pt puts forth good effort with exercise. Will follow up with pt regarding increasing treadmilll or nustep workloads.     Expected Outcomes Pt will continue to increae strength and stamina. Pt will continue to exercise most days of the week. Pt will continue to walk 2-3 days a week 30-45 minutes. Pt will  continue to exercise most day of the week and increase his cardiovascular strength.             Discharge Exercise Prescription (Final Exercise Prescription Changes):  Exercise Prescription Changes - 11/09/19 1317      Response to Exercise   Blood Pressure (Admit) 110/68    Blood Pressure (Exercise) 128/80    Blood Pressure (Exit) 118/70    Heart Rate (Admit) 83 bpm    Heart Rate (Exercise) 118 bpm    Heart Rate (Exit) 82 bpm    Rating of Perceived Exertion (Exercise) 12    Perceived Dyspnea (Exercise) 0    Symptoms None    Comments None    Duration Continue with 30 min of aerobic exercise without signs/symptoms of physical distress.    Intensity THRR unchanged      Progression   Progression Continue to progress workloads to maintain intensity without signs/symptoms of physical distress.    Average METs 3.2      Resistance Training   Training Prescription Yes    Weight 5lbs    Reps 10-15    Time 10 Minutes      Interval Training   Interval Training No      Treadmill   MPH 3.2    Grade 0    Minutes 15    METs 3.45      NuStep   Level 3    SPM 100    Minutes 15    METs 2.9      Home Exercise Plan   Plans to continue exercise at Home (comment)   Walking   Frequency Add 3 additional days to program exercise sessions.    Initial Home Exercises Provided 10/24/19           Nutrition:  Target Goals: Understanding of nutrition guidelines, daily intake of sodium 1500mg , cholesterol 200mg , calories 30% from fat and 7% or less from saturated fats, daily to have 5 or more servings of fruits and vegetables.  Biometrics:  Pre Biometrics - 09/20/19 0904      Pre Biometrics   Height 5' 9.5" (1.765 m)    Weight 95.7 kg    Waist Circumference 41.5 inches    Hip Circumference 45.5 inches    Waist to Hip Ratio 0.91 %    BMI (Calculated) 30.72    Triceps Skinfold 18.5 mm    % Body Fat 30.6 %    Grip Strength 29.5  kg    Flexibility 0 in    Single Leg Stand 4.93  seconds            Nutrition Therapy Plan and Nutrition Goals:  Nutrition Therapy & Goals - 09/28/19 0959      Nutrition Therapy   Diet Heart healthy      Personal Nutrition Goals   Nutrition Goal Pt to identify food quantities necessary to achieve weight loss of 5 lb at graduation from cardiac rehab.    Personal Goal #2 Pt to read labels and limit sodium <300 mg/label.      Intervention Plan   Intervention Prescribe, educate and counsel regarding individualized specific dietary modifications aiming towards targeted core components such as weight, hypertension, lipid management, diabetes, heart failure and other comorbidities.;Nutrition handout(s) given to patient.    Expected Outcomes Short Term Goal: A plan has been developed with personal nutrition goals set during dietitian appointment.           Nutrition Assessments:  Nutrition Assessments - 09/28/19 1000      MEDFICTS Scores   Pre Score 46           Nutrition Goals Re-Evaluation:  Nutrition Goals Re-Evaluation    Row Name 09/28/19 1000             Goals   Current Weight 210 lb (95.3 kg)       Nutrition Goal Pt to identify food quantities necessary to achieve weight loss of 5 lb at graduation from cardiac rehab.         Personal Goal #2 Re-Evaluation   Personal Goal #2 Pt to read labels and limit sodium <300 mg/label.              Nutrition Goals Discharge (Final Nutrition Goals Re-Evaluation):  Nutrition Goals Re-Evaluation - 09/28/19 1000      Goals   Current Weight 210 lb (95.3 kg)    Nutrition Goal Pt to identify food quantities necessary to achieve weight loss of 5 lb at graduation from cardiac rehab.      Personal Goal #2 Re-Evaluation   Personal Goal #2 Pt to read labels and limit sodium <300 mg/label.           Psychosocial: Target Goals: Acknowledge presence or absence of significant depression and/or stress, maximize coping skills, provide positive support system. Participant is  able to verbalize types and ability to use techniques and skills needed for reducing stress and depression.  Initial Review & Psychosocial Screening:  Initial Psych Review & Screening - 09/20/19 1206      Initial Review   Current issues with None Identified      Family Dynamics   Good Support System? Yes   Kyshaun has his wife and two children for support     Barriers   Psychosocial barriers to participate in program There are no identifiable barriers or psychosocial needs.      Screening Interventions   Interventions Encouraged to exercise           Quality of Life Scores:  Quality of Life - 09/20/19 1047      Quality of Life   Select Quality of Life      Quality of Life Scores   Health/Function Pre 29.67 %    Socioeconomic Pre 27.5 %    Psych/Spiritual Pre 29.64 %    Family Pre 29.5 %    GLOBAL Pre 29.19 %          Scores of 19 and  below usually indicate a poorer quality of life in these areas.  A difference of  2-3 points is a clinically meaningful difference.  A difference of 2-3 points in the total score of the Quality of Life Index has been associated with significant improvement in overall quality of life, self-image, physical symptoms, and general health in studies assessing change in quality of life.  PHQ-9: Recent Review Flowsheet Data    Depression screen [facility] 2/9 09/20/2019   Decreased Interest 0   Down, Depressed, Hopeless 0   PHQ - 2 Score 0     Interpretation of Total Score  Total Score Depression Severity:  1-4 = Minimal depression, 5-9 = Mild depression, 10-14 = Moderate depression, 15-19 = Moderately severe depression, 20-27 = Severe depression   Psychosocial Evaluation and Intervention:   Psychosocial Re-Evaluation:  Psychosocial Re-Evaluation    [place] Name 10/12/19 1024             Psychosocial Re-Evaluation   Current issues with Current Stress Concerns       Comments Gifford voices having some concerns about his wife's health as his wife  has parkinson's       Expected Outcomes will continue to monitor and offer support as needed       Interventions Stress management education;Encouraged to attend Cardiac Rehabilitation for the exercise       Continue Psychosocial Services  No Follow up required         Initial Review   Source of Stress Concerns Family              Psychosocial Discharge (Final Psychosocial Re-Evaluation):  Psychosocial Re-Evaluation - 10/12/19 1024      Psychosocial Re-Evaluation   Current issues with Current Stress Concerns    Comments Timothey voices having some concerns about his wife's health as his wife has parkinson's    Expected Outcomes will continue to monitor and offer support as needed    Interventions Stress management education;Encouraged to attend Cardiac Rehabilitation for the exercise    Continue Psychosocial Services  No Follow up required      Initial Review   Source of Stress Concerns Family           Vocational Rehabilitation: Provide vocational rehab assistance to qualifying candidates.   Vocational Rehab Evaluation & Intervention:  Vocational Rehab - 09/20/19 1207      Initial Vocational Rehab Evaluation & Intervention   Assessment shows need for Vocational Rehabilitation No   Antwoine is retired and does not need vocational rehab at this time          Education: Education Goals: Education classes will be provided on a weekly basis, covering required topics. Participant will state understanding/return demonstration of topics presented.  Learning Barriers/Preferences:   Education Topics: Hypertension, Hypertension Reduction -Define heart disease and high blood pressure. Discus how high blood pressure affects the body and ways to reduce high blood pressure.   Exercise and Your Heart -Discuss why it is important to exercise, the FITT principles of exercise, normal and abnormal responses to exercise, and how to exercise safely.   Angina -Discuss definition of  angina, causes of angina, treatment of angina, and how to decrease risk of having angina.   Cardiac Medications -Review what the following cardiac medications are used for, how they affect the body, and side effects that may occur when taking the medications.  Medications include Aspirin, Beta blockers, calcium channel blockers, ACE Inhibitors, angiotensin receptor blockers, diuretics, digoxin, and antihyperlipidemics.   Congestive  Heart Failure -Discuss the definition of CHF, how to live with CHF, the signs and symptoms of CHF, and how keep track of weight and sodium intake.   Heart Disease and Intimacy -Discus the effect sexual activity has on the heart, how changes occur during intimacy as we age, and safety during sexual activity.   Smoking Cessation / COPD -Discuss different methods to quit smoking, the health benefits of quitting smoking, and the definition of COPD.   Nutrition I: Fats -Discuss the types of cholesterol, what cholesterol does to the heart, and how cholesterol levels can be controlled.   Nutrition II: Labels -Discuss the different components of food labels and how to read food label   Heart Parts/Heart Disease and PAD -Discuss the anatomy of the heart, the pathway of blood circulation through the heart, and these are affected by heart disease.   Stress I: Signs and Symptoms -Discuss the causes of stress, how stress may lead to anxiety and depression, and ways to limit stress.   Stress II: Relaxation -Discuss different types of relaxation techniques to limit stress.   Warning Signs of Stroke / TIA -Discuss definition of a stroke, what the signs and symptoms are of a stroke, and how to identify when someone is having stroke.   Knowledge Questionnaire Score:  Knowledge Questionnaire Score - 09/20/19 1048      Knowledge Questionnaire Score   Pre Score 16/24           Core Components/Risk Factors/Patient Goals at Admission:  Personal Goals and  Risk Factors at Admission - 09/20/19 1048      Core Components/Risk Factors/Patient Goals on Admission    Weight Management Yes;Obesity    Intervention Weight Management/Obesity: Establish reasonable short term and long term weight goals.;Obesity: Provide education and appropriate resources to help participant work on and attain dietary goals.    Admit Weight 210 lb 15.7 oz (95.7 kg)    Expected Outcomes Short Term: Continue to assess and modify interventions until short term weight is achieved;Long Term: Adherence to nutrition and physical activity/exercise program aimed toward attainment of established weight goal    Hypertension Yes    Intervention Provide education on lifestyle modifcations including regular physical activity/exercise, weight management, moderate sodium restriction and increased consumption of fresh fruit, vegetables, and low fat dairy, alcohol moderation, and smoking cessation.;Monitor prescription use compliance.    Expected Outcomes Short Term: Continued assessment and intervention until BP is < 140/58mm HG in hypertensive participants. < 130/59mm HG in hypertensive participants with diabetes, heart failure or chronic kidney disease.;Long Term: Maintenance of blood pressure at goal levels.    Lipids Yes    Intervention Provide education and support for participant on nutrition & aerobic/resistive exercise along with prescribed medications to achieve LDL 70mg , HDL >40mg .    Expected Outcomes Short Term: Participant states understanding of desired cholesterol values and is compliant with medications prescribed. Participant is following exercise prescription and nutrition guidelines.;Long Term: Cholesterol controlled with medications as prescribed, with individualized exercise RX and with personalized nutrition plan. Value goals: LDL < 70mg , HDL > 40 mg.           Core Components/Risk Factors/Patient Goals Review:   Goals and Risk Factor Review    Row Name 10/12/19 1037              Core Components/Risk Factors/Patient Goals Review   Personal Goals Review Weight Management/Obesity;Hypertension;Lipids;Stress       Review Darrius has been doing well with exercise at cardiac rehab. Tarik's  resting heart rate has been improved since [place] was started on a low dose beta blocker.       Expected Outcomes Gayle will continue to participate in phase 2 cardiac rehab for exercise, nutrition and lifestyle modifications              Core Components/Risk Factors/Patient Goals at Discharge (Final Review):   Goals and Risk Factor Review - 10/12/19 1037      Core Components/Risk Factors/Patient Goals Review   Personal Goals Review Weight Management/Obesity;Hypertension;Lipids;Stress    Review Charlis has been doing well with exercise at cardiac rehab. Basem's resting heart rate has been improved since [place] was started on a low dose beta blocker.    Expected Outcomes Bayne will continue to participate in phase 2 cardiac rehab for exercise, nutrition and lifestyle modifications           ITP Comments:  ITP Comments    Row Name 09/20/19 0905 10/12/19 1022         ITP Comments Medical Director- Dr. Fransico Him, MD 30 Day ITP Review. Patient with good attendance and participation in phase 2 cardiac rehab. Najee attends exercise twice a week.             Comments: See ITP comments. Patient continues to do excellent with exercise.Barnet Pall, RN,BSN 11/10/2019 3:24 PM

## 2019-11-11 ENCOUNTER — Encounter (HOSPITAL_COMMUNITY): Payer: Medicare Other

## 2019-11-14 ENCOUNTER — Other Ambulatory Visit: Payer: Self-pay

## 2019-11-14 ENCOUNTER — Encounter (HOSPITAL_COMMUNITY)
Admission: RE | Admit: 2019-11-14 | Discharge: 2019-11-14 | Disposition: A | Discharge status: Home / Self Care | Payer: Medicare Other | Source: Ambulatory Visit | Attending: Cardiology | Admitting: Cardiology

## 2019-11-14 VITALS — Ht 69.5 in | Wt 211.2 lb

## 2019-11-14 DIAGNOSIS — Z952 Presence of prosthetic heart valve: Secondary | ICD-10-CM | POA: Diagnosis not present

## 2019-11-16 ENCOUNTER — Encounter (HOSPITAL_COMMUNITY)
Admission: RE | Admit: 2019-11-16 | Discharge: 2019-11-16 | Disposition: A | Discharge status: Home / Self Care | Payer: Medicare Other | Source: Ambulatory Visit | Attending: Cardiology | Admitting: Cardiology

## 2019-11-16 ENCOUNTER — Other Ambulatory Visit: Payer: Self-pay

## 2019-11-16 DIAGNOSIS — Z952 Presence of prosthetic heart valve: Secondary | ICD-10-CM | POA: Diagnosis not present

## 2019-11-16 NOTE — Progress Notes (Signed)
Discharge Progress Report  Patient Details  Name: [name] [name] MRN: [id number] Date of Birth: [birth year] Referring Provider:     CARDIAC REHAB PHASE II ORIENTATION from 09/20/2019 in [place]  Referring Provider Vernell Leep I. MD        Number of Visits: 13  Reason for Discharge:  Patient reached a stable level of exercise. Patient independent in their exercise. Patient has met program and personal goals.  Smoking History:  Social History   Tobacco Use  Smoking Status Former Smoker  . Packs/day: 1.00  . Years: 20.00  . Pack years: 20.00  . Types: Cigarettes  . Quit date: 14  . Years since quitting: 31.7  Smokeless Tobacco Never Used    Diagnosis:  07/05/19 TAVR  ADL UCSD:    Initial Exercise Prescription:  Initial Exercise Prescription - 09/20/19 1000       Date of Initial Exercise RX and Referring Provider   Date 09/20/19    Referring Provider Vernell Leep I. MD    Expected Discharge Date 11/18/19      Treadmill   MPH 1.7    Grade 0    Minutes 15    METs 2.3      NuStep   Level 3    SPM 85    Minutes 15    METs 2.3      Prescription Details   Frequency (times per week) 2    Duration Progress to 30 minutes of continuous aerobic without signs/symptoms of physical distress      Intensity   THRR 40-80% of Max Heartrate 54-108    Ratings of Perceived Exertion 11-13    Perceived Dyspnea 0-4      Progression   Progression [name] to progress workloads to maintain intensity without signs/symptoms of physical distress.      Resistance Training   Training Prescription Yes    Weight 3lbs    Reps 10-15             Discharge Exercise Prescription (Final Exercise Prescription Changes):  Exercise Prescription Changes - 11/09/19 1317       Response to Exercise   Blood Pressure (Admit) 110/68    Blood Pressure (Exercise) 128/80    Blood Pressure (Exit) 118/70    Heart Rate (Admit) 83 bpm     Heart Rate (Exercise) 118 bpm    Heart Rate (Exit) 82 bpm    Rating of Perceived Exertion (Exercise) 12    Perceived Dyspnea (Exercise) 0    Symptoms None    Comments None    Duration [name] with 30 min of aerobic exercise without signs/symptoms of physical distress.    Intensity THRR unchanged      Progression   Progression [name] to progress workloads to maintain intensity without signs/symptoms of physical distress.    Average METs 3.2      Resistance Training   Training Prescription Yes    Weight 5lbs    Reps 10-15    Time 10 Minutes      Interval Training   Interval Training No      Treadmill   MPH 3.2    Grade 0    Minutes 15    METs 3.45      NuStep   Level 3    SPM 100    Minutes 15    METs 2.9      Home Exercise Plan   Plans to [name] exercise at Home (comment)   Walking  Frequency Add 3 additional days to program exercise sessions.    Initial Home Exercises Provided 10/24/19             Functional Capacity:  6 Minute Walk     Row Name 09/20/19 0933 11/14/19 1025       6 Minute Walk   Phase Initial Discharge    Distance 1570 feet 1775 feet    Distance % Change -- 13.06 %    Distance Feet Change -- 205 ft    Walk Time 6 minutes 6 minutes    # of Rest Breaks 0 0    MPH 2.97 3.4    METS 2.42 2.72    RPE 11 12    Perceived Dyspnea  0 0    VO2 Peak 8.5 9.5    Symptoms No No    Resting HR 92 bpm 69 bpm    Resting BP 144/70 110/66    Resting Oxygen Saturation  97 % --    Exercise Oxygen Saturation  during 6 min walk 96 % --    Max Ex. HR 113 bpm 106 bpm    Max Ex. BP 120/82 120/64    2 Minute Post BP 122/80 120/60             Psychological, QOL, Others - Outcomes: PHQ 2/9: Depression screen [facility] 2/9 11/16/2019 09/20/2019  Decreased Interest 0 0  Down, Depressed, Hopeless 0 0  PHQ - 2 Score 0 0    Quality of Life:  Quality of Life - 09/20/19 1047       Quality of Life   Select Quality of Life      Quality of Life Scores    Health/Function Pre 29.67 %    Socioeconomic Pre 27.5 %    Psych/Spiritual Pre 29.64 %    Family Pre 29.5 %    GLOBAL Pre 29.19 %             Personal Goals: Goals established at orientation with interventions provided to work toward goal.  Personal Goals and Risk Factors at Admission - 09/20/19 1048       Core Components/Risk Factors/Patient Goals on Admission    Weight Management Yes;Obesity    Intervention Weight Management/Obesity: Establish reasonable short term and long term weight goals.;Obesity: Provide education and appropriate resources to help participant work on and attain dietary goals.    Admit Weight 210 lb 15.7 oz (95.7 kg)    Expected Outcomes Short Term: [name] to assess and modify interventions until short term weight is achieved;Long Term: Adherence to nutrition and physical activity/exercise program aimed toward attainment of established weight goal    Hypertension Yes    Intervention Provide education on lifestyle modifcations including regular physical activity/exercise, weight management, moderate sodium restriction and increased consumption of fresh fruit, vegetables, and low fat dairy, alcohol moderation, and smoking cessation.;Monitor prescription use compliance.    Expected Outcomes Short Term: Continued assessment and intervention until BP is < 140/59m HG in hypertensive participants. < 130/841mHG in hypertensive participants with diabetes, heart failure or chronic kidney disease.;Long Term: Maintenance of blood pressure at goal levels.    Lipids Yes    Intervention Provide education and support for participant on nutrition & aerobic/resistive exercise along with prescribed medications to achieve LDL <7058mHDL >11m66m  Expected Outcomes Short Term: Participant states understanding of desired cholesterol values and is compliant with medications prescribed. Participant is following exercise prescription and nutrition guidelines.;Long Term: Cholesterol  controlled with medications  as prescribed, with individualized exercise RX and with personalized nutrition plan. Value goals: LDL < 96m, HDL > 40 mg.              Personal Goals Discharge:  Goals and Risk Factor Review     Row Name 10/12/19 1037 11/16/19 0954           Core Components/Risk Factors/Patient Goals Review   Personal Goals Review Weight Management/Obesity;Hypertension;Lipids;Stress Weight Management/Obesity;Hypertension;Lipids;Stress      Review [name] [name] been doing well with exercise at cardiac rehab. [name] [name]'s resting heart rate has been improved since [name] [name] on a low dose beta blocker. [name] [name] well with exercise. [name] [name] vital signs were stable. [name] [name] to [name] exercise by walking 3- 4 days a week and playing golf.      Expected Outcomes [name] [name] to participate in phase 2 cardiac rehab for exercise, nutrition and lifestyle modifications [name] [name] to exercise upon completion of phase 2 cardiac rehab. Follow nutrtion and lifestyle modifications for heart health.               Exercise Goals and Review:  Exercise Goals     Row Name 09/20/19 07282            Exercise Goals   Increase Physical Activity Yes       Intervention Provide advice, education, support and counseling about physical activity/exercise needs.;Develop an individualized exercise prescription for aerobic and resistive training based on initial evaluation findings, risk stratification, comorbidities and participant's personal goals.       Expected Outcomes Short Term: Attend rehab on a regular basis to increase amount of physical activity.;Long Term: Exercising regularly at least 3-5 days a week.;Long Term: Add in home exercise to make exercise part of routine and to increase amount of physical activity.       Increase Strength and Stamina Yes       Intervention Provide advice, education, support and counseling about physical activity/exercise needs.;Develop  an individualized exercise prescription for aerobic and resistive training based on initial evaluation findings, risk stratification, comorbidities and participant's personal goals.       Expected Outcomes Short Term: Increase workloads from initial exercise prescription for resistance, speed, and METs.;Short Term: Perform resistance training exercises routinely during rehab and add in resistance training at home;Long Term: Improve cardiorespiratory fitness, muscular endurance and strength as measured by increased METs and functional capacity (6MWT)       Able to understand and use rate of perceived exertion (RPE) scale Yes       Intervention Provide education and explanation on how to use RPE scale       Expected Outcomes Short Term: Able to use RPE daily in rehab to express subjective intensity level;Long Term:  Able to use RPE to guide intensity level when exercising independently       Knowledge and understanding of Target Heart Rate Range (THRR) Yes       Intervention Provide education and explanation of THRR including how the numbers were predicted and where they are located for reference       Expected Outcomes Short Term: Able to state/look up THRR;Long Term: Able to use THRR to govern intensity when exercising independently;Short Term: Able to use daily as guideline for intensity in rehab       Able to check pulse independently Yes       Intervention Provide education and demonstration on how to check pulse in carotid and radial arteries.;Review the importance  of being able to check your own pulse for safety during independent exercise       Expected Outcomes Short Term: Able to explain why pulse checking is important during independent exercise;Long Term: Able to check pulse independently and accurately       Understanding of Exercise Prescription Yes       Intervention Provide education, explanation, and written materials on patient's individual exercise prescription       Expected Outcomes  Short Term: Able to explain program exercise prescription;Long Term: Able to explain home exercise prescription to exercise independently                Exercise Goals Re-Evaluation:  Exercise Goals Re-Evaluation     Row Name 09/26/19 1044 10/24/19 1418 11/10/19 1319         Exercise Goal Re-Evaluation   Exercise Goals Review Increase Physical Activity;Understanding of Exercise Prescription;Increase Strength and Stamina;Knowledge and understanding of Target Heart Rate Range (THRR);Able to understand and use rate of perceived exertion (RPE) scale;Able to check pulse independently Increase Physical Activity;Understanding of Exercise Prescription;Increase Strength and Stamina;Knowledge and understanding of Target Heart Rate Range (THRR);Able to understand and use rate of perceived exertion (RPE) scale;Able to check pulse independently Increase Physical Activity;Understanding of Exercise Prescription;Increase Strength and Stamina;Knowledge and understanding of Target Heart Rate Range (THRR);Able to understand and use rate of perceived exertion (RPE) scale;Able to check pulse independently     Comments Pt's first day of exercise. Pt oriented to exercise equipment. Will [name] to monitor pt and progress. Reviewed HEP with pt. Also discussed THR, RPE Scale, weather precautions, NTG use, endpoints of exercise, warmup and cool down. Pt is able to exercise 30 minutes with not difficulty. Pt puts forth good effort with exercise. Will follow up with pt regarding increasing treadmilll or nustep workloads.     Expected Outcomes Pt will [name] to increae strength and stamina. Pt will [name] to exercise most days of the week. Pt will [name] to walk 2-3 days a week 30-45 minutes. Pt will [name] to exercise most day of the week and increase his cardiovascular strength.              Nutrition & Weight - Outcomes:  Pre Biometrics - 09/20/19 0904       Pre Biometrics   Height 5' 9.5" (1.765 m)      Weight 95.7 kg    Waist Circumference 41.5 inches    Hip Circumference 45.5 inches    Waist to Hip Ratio 0.91 %    BMI (Calculated) 30.72    Triceps Skinfold 18.5 mm    % Body Fat 30.6 %    Grip Strength 29.5 kg    Flexibility 0 in    Single Leg Stand 4.93 seconds             Post Biometrics - 11/14/19 1026        Post  Biometrics   Height 5' 9.5" (1.765 m)    Weight 95.8 kg    Waist Circumference 41 inches    Hip Circumference 45 inches    Waist to Hip Ratio 0.91 %    BMI (Calculated) 30.75    Triceps Skinfold 19 mm    % Body Fat 35.4 %    Grip Strength 36 kg    Flexibility 0 in    Single Leg Stand 10.2 seconds             Nutrition:  Nutrition Therapy & Goals - 09/28/19 6967  Nutrition Therapy   Diet Heart healthy      Personal Nutrition Goals   Nutrition Goal Pt to identify food quantities necessary to achieve weight loss of 5 lb at graduation from cardiac rehab.    Personal Goal #2 Pt to read labels and limit sodium <300 mg/label.      Intervention Plan   Intervention Prescribe, educate and counsel regarding individualized specific dietary modifications aiming towards targeted core components such as weight, hypertension, lipid management, diabetes, heart failure and other comorbidities.;Nutrition handout(s) given to patient.    Expected Outcomes Short Term Goal: A plan has been developed with personal nutrition goals set during dietitian appointment.             Nutrition Discharge:  Nutrition Assessments - 09/28/19 1000       MEDFICTS Scores   Pre Score 46             Education Questionnaire Score:  Knowledge Questionnaire Score - 11/16/19 0902       Knowledge Questionnaire Score   Pre Score 16/24    Post Score 22/24             Goals reviewed with patient; copy given to patient.Pt graduated from cardiac rehab program today with completion of 13 exercise sessions in Phase II. Pt maintained good attendance and  progressed nicely during his participation in rehab as evidenced by increased MET level.   Medication list reconciled. Repeat  PHQ score- 0 .  Pt has made significant lifestyle changes and should be commended for his success. Pt feels he has achieved his goals during cardiac rehab. [name] [name] increased his distance on his post exercise walk test by 205 feet.  Pt plans to [name] exercise by walking doing his stretches at home and playing golf. We are proud of [name] [name]'s progress!Barnet Pall, RN,BSN 11/16/2019 10:15 AM

## 2019-11-18 ENCOUNTER — Encounter (HOSPITAL_COMMUNITY): Payer: Medicare Other

## 2019-11-25 DIAGNOSIS — Z23 Encounter for immunization: Secondary | ICD-10-CM | POA: Diagnosis not present

## 2019-12-20 DIAGNOSIS — X32XXXD Exposure to sunlight, subsequent encounter: Secondary | ICD-10-CM | POA: Diagnosis not present

## 2019-12-20 DIAGNOSIS — C44311 Basal cell carcinoma of skin of nose: Secondary | ICD-10-CM | POA: Diagnosis not present

## 2019-12-20 DIAGNOSIS — L218 Other seborrheic dermatitis: Secondary | ICD-10-CM | POA: Diagnosis not present

## 2019-12-20 DIAGNOSIS — L570 Actinic keratosis: Secondary | ICD-10-CM | POA: Diagnosis not present

## 2019-12-30 ENCOUNTER — Other Ambulatory Visit: Payer: Self-pay | Admitting: Physician Assistant

## 2020-01-14 ENCOUNTER — Other Ambulatory Visit: Payer: Self-pay | Admitting: Physician Assistant

## 2020-01-23 ENCOUNTER — Other Ambulatory Visit: Payer: Self-pay

## 2020-01-23 MED ORDER — CLOPIDOGREL BISULFATE 75 MG PO TABS: 75.0000 mg | ORAL_TABLET | Freq: Every day | ORAL | 1 refills | Status: DC

## 2020-01-23 NOTE — Telephone Encounter (Signed)
Pt's medication was sent to pt's pharmacy as requested. Confirmation received.  °

## 2020-02-15 ENCOUNTER — Ambulatory Visit: Payer: Medicare Other | Admitting: Cardiology

## 2020-02-16 ENCOUNTER — Other Ambulatory Visit: Payer: Self-pay

## 2020-02-16 ENCOUNTER — Encounter: Payer: Self-pay | Admitting: Cardiology

## 2020-02-16 ENCOUNTER — Ambulatory Visit: Payer: Medicare Other | Admitting: Cardiology

## 2020-02-16 VITALS — BP 138/57 | HR 71 | Resp 16 | Ht 69.0 in | Wt 211.0 lb

## 2020-02-16 DIAGNOSIS — I350 Nonrheumatic aortic (valve) stenosis: Secondary | ICD-10-CM

## 2020-02-16 DIAGNOSIS — I10 Essential (primary) hypertension: Secondary | ICD-10-CM

## 2020-02-16 DIAGNOSIS — I6523 Occlusion and stenosis of bilateral carotid arteries: Secondary | ICD-10-CM

## 2020-02-16 DIAGNOSIS — Z952 Presence of prosthetic heart valve: Secondary | ICD-10-CM

## 2020-02-16 NOTE — Progress Notes (Signed)
Patient is here for follow up visit.  Subjective:   '@Patient'$  ID: [name] [name], male    DOB: 08-Jul-1931, 86 y.o.   MRN: [id number]   Chief Complaint  Patient presents with  . S/P TAVR   . 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  Patient underwent successful TAVR in 07/2019. He is doing well and denies chest pain, shortness of breath, palpitations, leg edema, orthopnea, PND, TIA/syncope. Unfortunately, he lost his wife to stroke recently.   Reviewed recent echocardiogram results with the patient, details below.   Current Outpatient Medications on File Prior to Visit  Medication Sig Dispense Refill  . amoxicillin (AMOXIL) 500 MG tablet Take 4 capsules (2,000 mg) one hour prior to dental visits. 8 tablet 12  . aspirin EC 81 MG tablet Take 1 tablet (81 mg total) by mouth daily. 30 tablet 2  . b complex vitamins tablet Take 1 tablet by mouth daily.    . Biotin 10000 MCG TABS Take 10,000 mcg by mouth daily.    . cetirizine (ZYRTEC) 10 MG tablet Take 10 mg by mouth daily.    . cholecalciferol (VITAMIN D3) 25 MCG (1000 UNIT) tablet Take 3,000 Units by mouth daily.    . clopidogrel (PLAVIX) 75 MG tablet Take 1 tablet (75 mg total) by mouth daily with breakfast. 90 tablet 1  . furosemide (LASIX) 20 MG tablet Take 1 tablet (20 mg total) by mouth daily. 90 tablet 3  . L-Lysine 500 MG CAPS Take 500 mg by mouth See admin instructions. Take 500 mg daily or as needed, may take 1000-1500 mg instead when experiencing a cold sore    . metoprolol tartrate (LOPRESSOR) 25 MG tablet Take 0.5 tablets (12.5 mg total) by mouth 2 (two) times daily. 60 tablet 3  . Misc Natural Products (OSTEO BI-FLEX ADV TRIPLE ST) TABS Take 1 tablet by mouth daily.    . Misc Natural Products (PROSTATE CONTROL PO) Take 1 tablet by mouth daily.     . Multiple Vitamin (MULTIVITAMIN WITH MINERALS)  TABS tablet Take 1 tablet by mouth daily.    . rosuvastatin (CRESTOR) 40 MG tablet Take 40 mg by mouth daily.    . tadalafil (CIALIS) 20 MG tablet Take 40 mg by mouth daily as needed for erectile dysfunction.    . vitamin E 180 MG (400 UNITS) capsule Take 400 Units by mouth daily.     No current facility-administered medications on file prior to visit.    Cardiovascular studies:  EKG 02/16/2020: Sinus rhythm 67 bpm Cannot exclude old inferior infarct Otherwise normal EKG  Carotid artery duplex 09/30/2019:  Stenosis in the right internal carotid artery (50-69%), upper end of  spectrum. Stenosis in the right external carotid artery (<50%).  Stenosis in the left internal carotid artery (50-69%), lower end of  spectrum.  Antegrade right vertebral artery flow. Antegrade left vertebral artery  flow.  Follow up in six months is appropriate if clinically indicated. No  significant change from 03/30/2019, minimal progression in the left carotid  Disease.  Echocardiogram 08/17/2019: Normal LV systolic function with EF 62%. Severe concentric hypertrophy of the left ventricle. Normal global wall motion. Left ventricle cavity is normal in size. Doppler evidence of grade I (impaired) diastolic dysfunction, normal LAP. Calculated EF 62%. Left atrial cavity is mildly dilated. 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 Well  seated and well functioning bioprosthetic aortic valve (Edwards Sapien 3 THV size 29 mm, placed 07/2019). Mean PG 6 mmHg. No paravalvular leak. Mild (Grade I) mitral regurgitation. Mild tricuspid regurgitation. Estimated pulmonary artery systolic pressure is 27 mmHg. Mild pulmonic regurgitation. Compared to previous post TAVR study on 5.5.2021, RVSP reduced from 34 mmHg to 27 mmHg.   07/05/2019: Transfemoral TAVR Edwards Sapien 3 THV (size 29 mm)  RHC/Cor 06/21/2019: LM: Normal LAD: Medial calcification seen in prox LAD without  significant stenosis         Mid LAD focal 40% stenosis LCX: Large, dominant.         Medial calcification seen in prox LCX without significant stenosis RCA: Small, nondominant. Normal.  Normal right heart cath  Recent labs: 07/06/2019: Glucose 112, BUN/Cr 14/0.9. EGFR >60. Na/K 141/3.6. Rest of the CMP normal H/H 10.7/31.3. MCV 93. Platelets 117   Review of Systems  Cardiovascular: Negative for chest pain, dyspnea on exertion, leg swelling, palpitations and syncope.       Objective:    Vitals:   02/16/20 1022  BP: (!) 138/57  Pulse: 71  Resp: 16  SpO2: 96%     Physical Exam Vitals and nursing note reviewed.  Constitutional:      General: He is not in acute distress. HENT:     Head: Normocephalic and atraumatic.  Neck:     Vascular: No JVD.  Cardiovascular:     Rate and Rhythm: Normal rate and regular rhythm.     Pulses: Intact distal pulses.     Heart sounds: Murmur heard.   Harsh midsystolic murmur is present with a grade of 1/6 at the upper right sternal border radiating to the neck.   Pulmonary:     Effort: Pulmonary effort is normal.     Breath sounds: Normal breath sounds. No wheezing or rales.  Neurological:     Cranial Nerves: No cranial nerve deficit.         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  Aortic stenosis: Now resolved s/p TAVR. No dyspnea now.  Reviewed echocardiogram 02/2020. Valve functioning well. No paravalvular leak.  Continue Aspirin, stop plavix.  Hypertension: SBP mildly elevated. Recommend home monitoring.  If SBP remains in high 130s, recommend increasing metoprolol tartarate to 25 mg bid or add amlodipine 2.5 mg.  Carotid artery stenosis: Bilateral, asymptomatic.  Continue aspirin, statin.    F/u in 6 months    Derwood Becraft Esther Hardy, MD [facility] Pager:  [phone] Office: [phone] If no answer Cell [phone]

## 2020-02-17 ENCOUNTER — Ambulatory Visit: Payer: Medicare Other | Admitting: Cardiology

## 2020-03-25 ENCOUNTER — Other Ambulatory Visit: Payer: Self-pay | Admitting: Cardiology

## 2020-03-25 DIAGNOSIS — I10 Essential (primary) hypertension: Secondary | ICD-10-CM

## 2020-03-25 DIAGNOSIS — R000 Tachycardia, unspecified: Secondary | ICD-10-CM

## 2020-03-27 ENCOUNTER — Telehealth: Payer: Self-pay

## 2020-04-09 DIAGNOSIS — E7800 Pure hypercholesterolemia, unspecified: Secondary | ICD-10-CM | POA: Diagnosis not present

## 2020-04-09 DIAGNOSIS — Z125 Encounter for screening for malignant neoplasm of prostate: Secondary | ICD-10-CM | POA: Diagnosis not present

## 2020-04-10 DIAGNOSIS — R82998 Other abnormal findings in urine: Secondary | ICD-10-CM | POA: Diagnosis not present

## 2020-04-16 DIAGNOSIS — M5417 Radiculopathy, lumbosacral region: Secondary | ICD-10-CM | POA: Diagnosis not present

## 2020-04-16 DIAGNOSIS — F5221 Male erectile disorder: Secondary | ICD-10-CM | POA: Diagnosis not present

## 2020-04-16 DIAGNOSIS — M9902 Segmental and somatic dysfunction of thoracic region: Secondary | ICD-10-CM | POA: Diagnosis not present

## 2020-04-16 DIAGNOSIS — I10 Essential (primary) hypertension: Secondary | ICD-10-CM | POA: Diagnosis not present

## 2020-04-16 DIAGNOSIS — E7800 Pure hypercholesterolemia, unspecified: Secondary | ICD-10-CM | POA: Diagnosis not present

## 2020-04-16 DIAGNOSIS — I6523 Occlusion and stenosis of bilateral carotid arteries: Secondary | ICD-10-CM | POA: Diagnosis not present

## 2020-04-16 DIAGNOSIS — M9905 Segmental and somatic dysfunction of pelvic region: Secondary | ICD-10-CM | POA: Diagnosis not present

## 2020-04-16 DIAGNOSIS — M5136 Other intervertebral disc degeneration, lumbar region: Secondary | ICD-10-CM | POA: Diagnosis not present

## 2020-04-16 DIAGNOSIS — Z0000 Encounter for general adult medical examination without abnormal findings: Secondary | ICD-10-CM | POA: Diagnosis not present

## 2020-04-16 DIAGNOSIS — E669 Obesity, unspecified: Secondary | ICD-10-CM | POA: Diagnosis not present

## 2020-04-16 DIAGNOSIS — D126 Benign neoplasm of colon, unspecified: Secondary | ICD-10-CM | POA: Diagnosis not present

## 2020-04-16 DIAGNOSIS — M9903 Segmental and somatic dysfunction of lumbar region: Secondary | ICD-10-CM | POA: Diagnosis not present

## 2020-04-16 DIAGNOSIS — Z952 Presence of prosthetic heart valve: Secondary | ICD-10-CM | POA: Diagnosis not present

## 2020-04-16 DIAGNOSIS — M5415 Radiculopathy, thoracolumbar region: Secondary | ICD-10-CM | POA: Diagnosis not present

## 2020-04-16 DIAGNOSIS — I517 Cardiomegaly: Secondary | ICD-10-CM | POA: Diagnosis not present

## 2020-04-16 DIAGNOSIS — I350 Nonrheumatic aortic (valve) stenosis: Secondary | ICD-10-CM | POA: Diagnosis not present

## 2020-04-30 DIAGNOSIS — M5136 Other intervertebral disc degeneration, lumbar region: Secondary | ICD-10-CM | POA: Diagnosis not present

## 2020-04-30 DIAGNOSIS — M9902 Segmental and somatic dysfunction of thoracic region: Secondary | ICD-10-CM | POA: Diagnosis not present

## 2020-04-30 DIAGNOSIS — M5417 Radiculopathy, lumbosacral region: Secondary | ICD-10-CM | POA: Diagnosis not present

## 2020-04-30 DIAGNOSIS — M9905 Segmental and somatic dysfunction of pelvic region: Secondary | ICD-10-CM | POA: Diagnosis not present

## 2020-04-30 DIAGNOSIS — M5415 Radiculopathy, thoracolumbar region: Secondary | ICD-10-CM | POA: Diagnosis not present

## 2020-04-30 DIAGNOSIS — M9903 Segmental and somatic dysfunction of lumbar region: Secondary | ICD-10-CM | POA: Diagnosis not present

## 2020-05-03 ENCOUNTER — Telehealth: Payer: Self-pay

## 2020-05-03 NOTE — Telephone Encounter (Signed)
Correct. Does not need clopidogrel. Should be discontinued.  Thanks MJP

## 2020-05-03 NOTE — Telephone Encounter (Signed)
Patient called that his pharmacy refilled his clopidogrel patient is currently not on it because you told him to get off it patient wants to know if he needs to be on it please advise

## 2020-05-03 NOTE — Telephone Encounter (Signed)
Patient aware.

## 2020-05-07 ENCOUNTER — Other Ambulatory Visit: Payer: Self-pay | Admitting: Cardiology

## 2020-05-07 DIAGNOSIS — I350 Nonrheumatic aortic (valve) stenosis: Secondary | ICD-10-CM

## 2020-05-07 DIAGNOSIS — Z952 Presence of prosthetic heart valve: Secondary | ICD-10-CM

## 2020-05-14 DIAGNOSIS — M9903 Segmental and somatic dysfunction of lumbar region: Secondary | ICD-10-CM | POA: Diagnosis not present

## 2020-05-14 DIAGNOSIS — M5415 Radiculopathy, thoracolumbar region: Secondary | ICD-10-CM | POA: Diagnosis not present

## 2020-05-14 DIAGNOSIS — M9902 Segmental and somatic dysfunction of thoracic region: Secondary | ICD-10-CM | POA: Diagnosis not present

## 2020-05-14 DIAGNOSIS — M5417 Radiculopathy, lumbosacral region: Secondary | ICD-10-CM | POA: Diagnosis not present

## 2020-05-14 DIAGNOSIS — M9905 Segmental and somatic dysfunction of pelvic region: Secondary | ICD-10-CM | POA: Diagnosis not present

## 2020-05-14 DIAGNOSIS — M5136 Other intervertebral disc degeneration, lumbar region: Secondary | ICD-10-CM | POA: Diagnosis not present

## 2020-05-28 DIAGNOSIS — M5417 Radiculopathy, lumbosacral region: Secondary | ICD-10-CM | POA: Diagnosis not present

## 2020-05-28 DIAGNOSIS — M5136 Other intervertebral disc degeneration, lumbar region: Secondary | ICD-10-CM | POA: Diagnosis not present

## 2020-05-28 DIAGNOSIS — M5415 Radiculopathy, thoracolumbar region: Secondary | ICD-10-CM | POA: Diagnosis not present

## 2020-05-28 DIAGNOSIS — M9902 Segmental and somatic dysfunction of thoracic region: Secondary | ICD-10-CM | POA: Diagnosis not present

## 2020-05-28 DIAGNOSIS — M9905 Segmental and somatic dysfunction of pelvic region: Secondary | ICD-10-CM | POA: Diagnosis not present

## 2020-05-28 DIAGNOSIS — M9903 Segmental and somatic dysfunction of lumbar region: Secondary | ICD-10-CM | POA: Diagnosis not present

## 2020-05-30 IMAGING — CT CT ANGIO CHEST
2 of 16 series · 15 of 37 positions shown · IV contrast (APPLIED)
Comparison: No priors.

CLINICAL DATA: 87-year-old male with history of severe aortic
stenosis. Preprocedural study prior to potential transcatheter
aortic valve replacement (TAVR) procedure.

EXAM:
CT ANGIOGRAPHY CHEST, ABDOMEN AND PELVIS
TECHNIQUE: Non-contrast CT of the chest was initially obtained.

[Series 8: 0-90% · axial · 0.39mm/px · z∈[-210,-48]mm · 7 of 3610 slices shown]
[im 452/3610  lung]
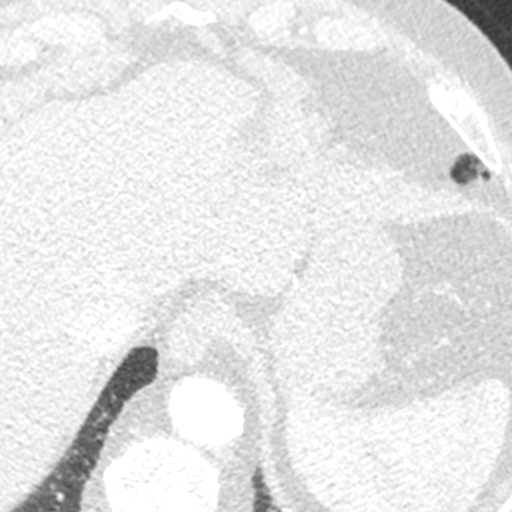
[im 903/3610  lung]
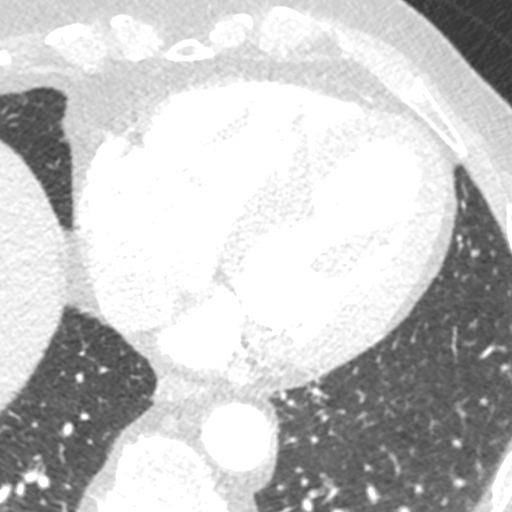
[im 1354/3610  lung]
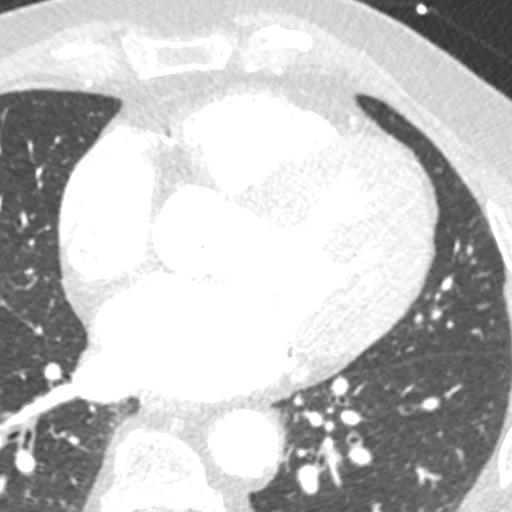
[im 1805/3610  lung]
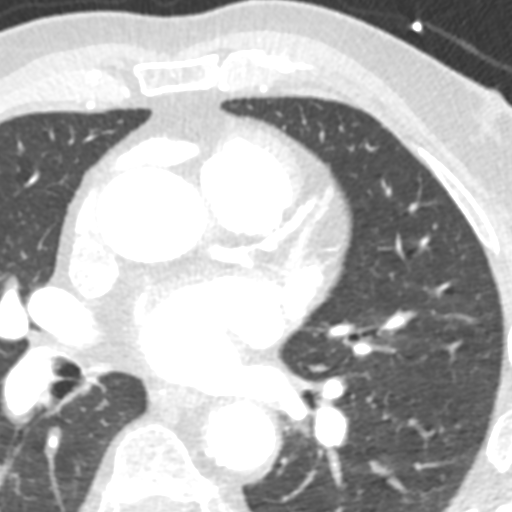
[im 2256/3610  lung]
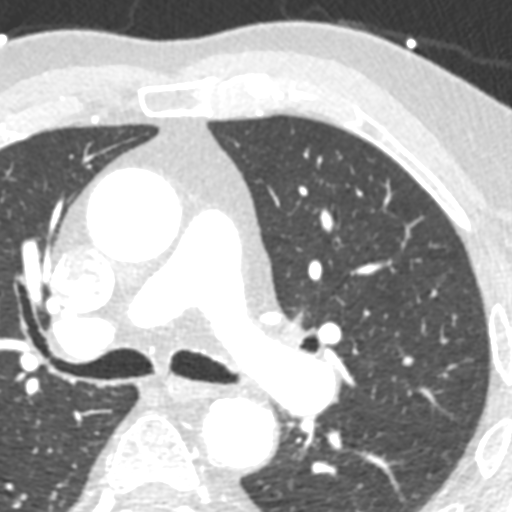
[im 2707/3610  lung]
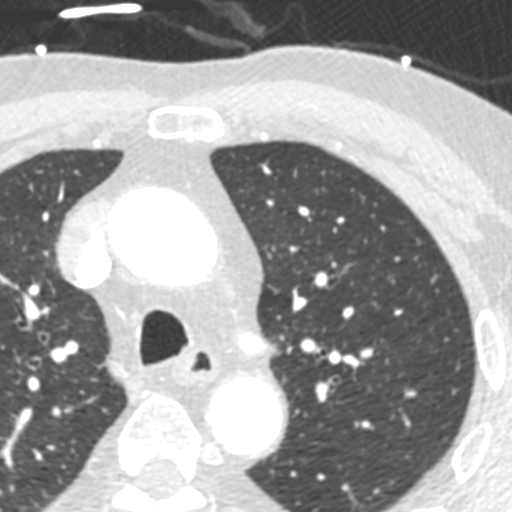
[im 3158/3610  lung]
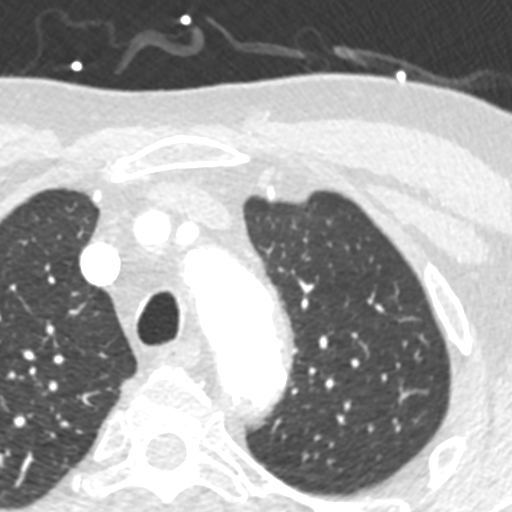

[Series 9: 5-95% · axial · 0.39mm/px · z∈[-212,-44]mm · 8 of 3610 slices shown]
[im 402/3610  lung]
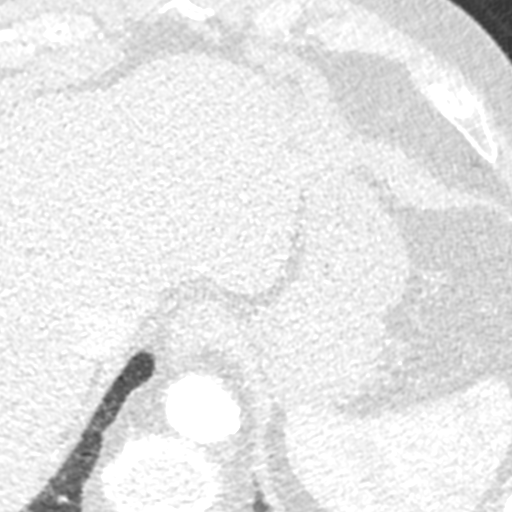
[im 803/3610  mediastinal]
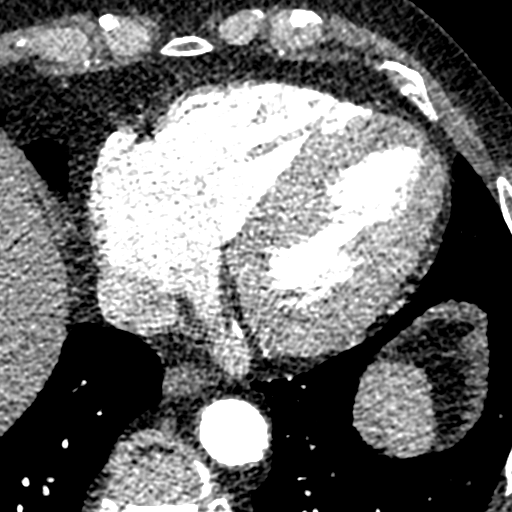
[im 1204/3610  lung]
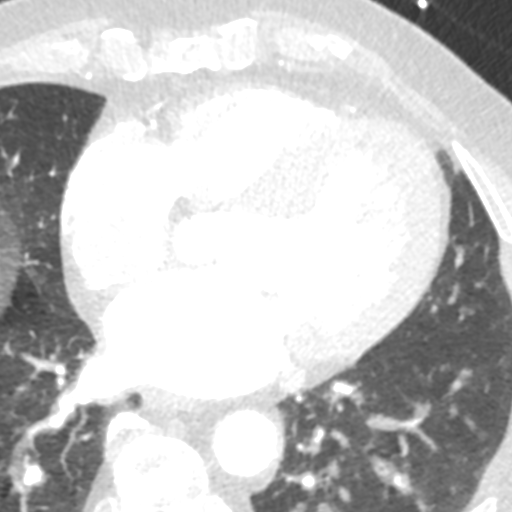
[im 1605/3610  mediastinal]
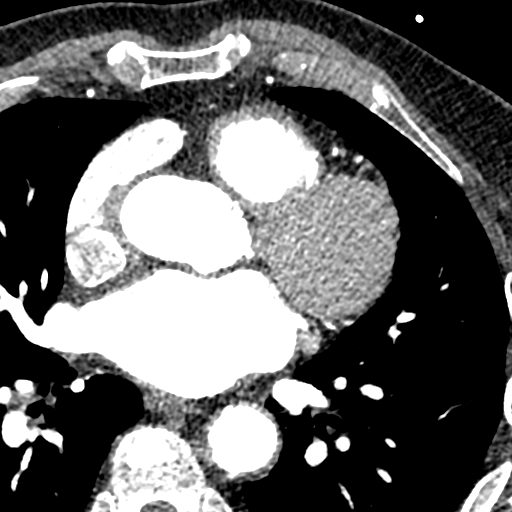
[im 2006/3610  lung]
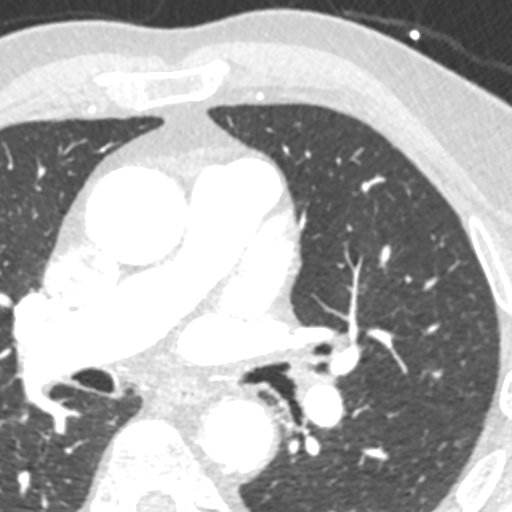
[im 2407/3610  mediastinal]
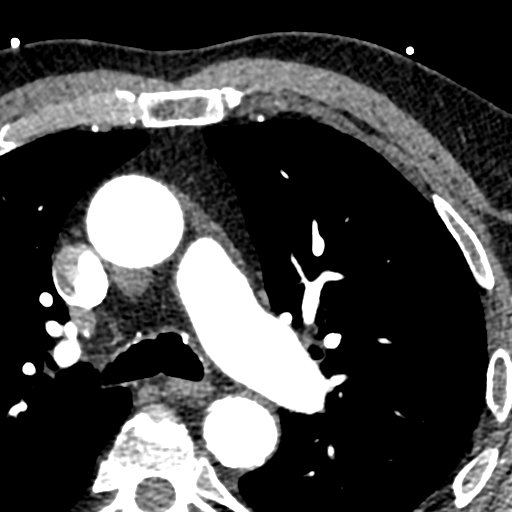
[im 2808/3610  lung]
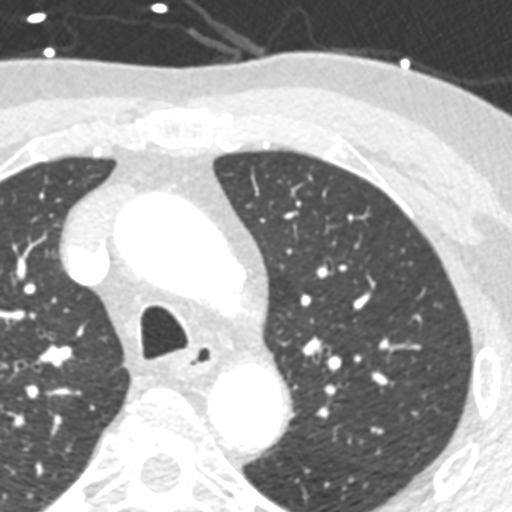
[im 3209/3610  mediastinal]
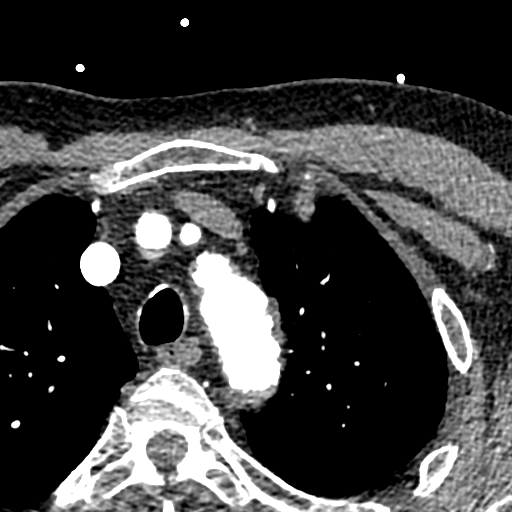

[15 of 37 positions shown; findings below may reference images not displayed]

Multidetector CT imaging through the chest, abdomen and pelvis was
performed using the standard protocol during bolus administration of
intravenous contrast. Multiplanar reconstructed images and MIPs were
obtained and reviewed to evaluate the vascular anatomy.

CONTRAST:  100mL OMNIPAQUE IOHEXOL 350 MG/ML SOLN
FINDINGS: CTA CHEST FINDINGS

Cardiovascular: Heart size is mildly enlarged. There is no
significant pericardial fluid, thickening or pericardial
calcification. There is aortic atherosclerosis, as well as
atherosclerosis of the great vessels of the mediastinum and the
coronary arteries, including calcified atherosclerotic plaque in the
left main, left anterior descending, left circumflex and right
coronary arteries. Severe thickening calcification of the aortic
valve. Mild calcifications of the mitral annulus.

Mediastinum/Lymph Nodes: No pathologically enlarged mediastinal or
hilar lymph nodes. Esophagus is unremarkable in appearance. No
axillary lymphadenopathy.

Lungs/Pleura: No suspicious appearing pulmonary nodules or masses
are noted. No acute consolidative airspace disease. No pleural
effusions.

Musculoskeletal/Soft Tissues: No suspicious cystic or solid hepatic
lesions. No intra or extrahepatic biliary ductal dilatation.
Gallbladder is normal in appearance.

CTA ABDOMEN AND PELVIS FINDINGS

Hepatobiliary: No suspicious cystic or solid hepatic lesions. No
intra or extrahepatic biliary ductal dilatation. Gallbladder is
normal in appearance.

Pancreas: No pancreatic mass. No pancreatic ductal dilatation. No
pancreatic or peripancreatic fluid collections or inflammatory
changes.

Spleen: Unremarkable.

Adrenals/Urinary Tract: Low-attenuation lesions are noted in the
kidneys bilaterally, compatible with simple cysts, largest of which
is exophytic in the lower pole of the left kidney measuring 5.5 cm
in diameter. Multiple subcentimeter low-attenuation lesions in both
kidneys, too small to definitively characterize, but statistically
likely to represent tiny cysts. Bilateral adrenal glands are normal
in appearance. No hydroureteronephrosis. Urinary bladder is normal
in appearance.

Stomach/Bowel: Normal appearance of the stomach. No pathologic
dilatation of small bowel or colon. Normal appendix.

Vascular/Lymphatic: Aortic atherosclerosis, with fusiform ectasia of
the infrarenal abdominal aorta which measures up to 2.8 x 2.7 cm in
diameter. Vascular findings and measurements pertinent to potential
TAVR procedure, as detailed below. No lymphadenopathy noted in the
abdomen or pelvis.

Reproductive: Prostate gland and seminal vesicles are unremarkable
in appearance.

Other: No significant volume of ascites.  No pneumoperitoneum.

Musculoskeletal: There are no aggressive appearing lytic or blastic
lesions noted in the visualized portions of the skeleton.

VASCULAR MEASUREMENTS PERTINENT TO TAVR:

AORTA:

Minimal Aortic Eiameter-JF x 17 mm

Severity of Aortic Calcification-moderate to severe

RIGHT PELVIS:

Right Common Iliac Artery -

Minimal Giameter-7Y.9 x 10.5 mm

Tortuosity-mild

Calcification-moderate

Right External Iliac Artery -

Minimal Qiameter-IP.N x 9.3 mm

Tortuosity-extreme

Calcification-mild

Right Common Femoral Artery -

Minimal Liameter-44.4 x 11.4 mm

Tortuosity-mild

Calcification-mild

LEFT PELVIS:

Left Common Iliac Artery -

Minimal Biameter-N.1 x 7.5 mm

Tortuosity-mild

Calcification-moderate

Left External Iliac Artery -

Minimal Kiameter-GV.G x 8.4 mm

Tortuosity-extreme

Calcification-mild

Left Common Femoral Artery -

Minimal Biameter-LL.N x 11.1 mm

Tortuosity-mild

Calcification-none

Review of the MIP images confirms the above findings.
IMPRESSION: 1. Vascular findings and measurements pertinent to potential TAVR
procedure, as detailed above.
2. Severe thickening calcification of the aortic valve, compatible
with the reported clinical history of severe aortic stenosis.
3. Mild cardiomegaly.
4. Left main and 3 vessel coronary artery disease.
5. Additional incidental findings, as above.

## 2020-06-18 DIAGNOSIS — M5415 Radiculopathy, thoracolumbar region: Secondary | ICD-10-CM | POA: Diagnosis not present

## 2020-06-18 DIAGNOSIS — M9903 Segmental and somatic dysfunction of lumbar region: Secondary | ICD-10-CM | POA: Diagnosis not present

## 2020-06-18 DIAGNOSIS — M9905 Segmental and somatic dysfunction of pelvic region: Secondary | ICD-10-CM | POA: Diagnosis not present

## 2020-06-18 DIAGNOSIS — M5136 Other intervertebral disc degeneration, lumbar region: Secondary | ICD-10-CM | POA: Diagnosis not present

## 2020-06-18 DIAGNOSIS — M9902 Segmental and somatic dysfunction of thoracic region: Secondary | ICD-10-CM | POA: Diagnosis not present

## 2020-06-18 DIAGNOSIS — M5417 Radiculopathy, lumbosacral region: Secondary | ICD-10-CM | POA: Diagnosis not present

## 2020-06-19 DIAGNOSIS — L570 Actinic keratosis: Secondary | ICD-10-CM | POA: Diagnosis not present

## 2020-06-19 DIAGNOSIS — C44722 Squamous cell carcinoma of skin of right lower limb, including hip: Secondary | ICD-10-CM | POA: Diagnosis not present

## 2020-06-19 DIAGNOSIS — Z1283 Encounter for screening for malignant neoplasm of skin: Secondary | ICD-10-CM | POA: Diagnosis not present

## 2020-06-19 DIAGNOSIS — D225 Melanocytic nevi of trunk: Secondary | ICD-10-CM | POA: Diagnosis not present

## 2020-06-19 DIAGNOSIS — L820 Inflamed seborrheic keratosis: Secondary | ICD-10-CM | POA: Diagnosis not present

## 2020-06-19 DIAGNOSIS — Z85820 Personal history of malignant melanoma of skin: Secondary | ICD-10-CM | POA: Diagnosis not present

## 2020-06-19 DIAGNOSIS — Z08 Encounter for follow-up examination after completed treatment for malignant neoplasm: Secondary | ICD-10-CM | POA: Diagnosis not present

## 2020-06-19 DIAGNOSIS — X32XXXD Exposure to sunlight, subsequent encounter: Secondary | ICD-10-CM | POA: Diagnosis not present

## 2020-07-16 DIAGNOSIS — M5417 Radiculopathy, lumbosacral region: Secondary | ICD-10-CM | POA: Diagnosis not present

## 2020-07-16 DIAGNOSIS — M5415 Radiculopathy, thoracolumbar region: Secondary | ICD-10-CM | POA: Diagnosis not present

## 2020-07-16 DIAGNOSIS — M5136 Other intervertebral disc degeneration, lumbar region: Secondary | ICD-10-CM | POA: Diagnosis not present

## 2020-07-16 DIAGNOSIS — M9902 Segmental and somatic dysfunction of thoracic region: Secondary | ICD-10-CM | POA: Diagnosis not present

## 2020-07-16 DIAGNOSIS — M9903 Segmental and somatic dysfunction of lumbar region: Secondary | ICD-10-CM | POA: Diagnosis not present

## 2020-07-16 DIAGNOSIS — M9905 Segmental and somatic dysfunction of pelvic region: Secondary | ICD-10-CM | POA: Diagnosis not present

## 2020-07-24 DIAGNOSIS — Z08 Encounter for follow-up examination after completed treatment for malignant neoplasm: Secondary | ICD-10-CM | POA: Diagnosis not present

## 2020-07-24 DIAGNOSIS — Z85828 Personal history of other malignant neoplasm of skin: Secondary | ICD-10-CM | POA: Diagnosis not present

## 2020-07-25 ENCOUNTER — Other Ambulatory Visit: Payer: Self-pay

## 2020-07-25 ENCOUNTER — Ambulatory Visit: Payer: Medicare Other

## 2020-07-25 DIAGNOSIS — R9431 Abnormal electrocardiogram [ECG] [EKG]: Secondary | ICD-10-CM | POA: Diagnosis not present

## 2020-07-25 DIAGNOSIS — E7800 Pure hypercholesterolemia, unspecified: Secondary | ICD-10-CM | POA: Diagnosis not present

## 2020-07-25 DIAGNOSIS — I350 Nonrheumatic aortic (valve) stenosis: Secondary | ICD-10-CM

## 2020-07-25 DIAGNOSIS — R012 Other cardiac sounds: Secondary | ICD-10-CM | POA: Diagnosis not present

## 2020-07-25 DIAGNOSIS — R0989 Other specified symptoms and signs involving the circulatory and respiratory systems: Secondary | ICD-10-CM | POA: Diagnosis not present

## 2020-07-25 DIAGNOSIS — I440 Atrioventricular block, first degree: Secondary | ICD-10-CM | POA: Diagnosis not present

## 2020-07-25 DIAGNOSIS — I10 Essential (primary) hypertension: Secondary | ICD-10-CM | POA: Diagnosis not present

## 2020-08-02 ENCOUNTER — Other Ambulatory Visit: Payer: Medicare Other

## 2020-08-13 ENCOUNTER — Encounter: Payer: Self-pay | Admitting: Cardiology

## 2020-08-13 ENCOUNTER — Ambulatory Visit: Payer: Medicare Other | Admitting: Cardiology

## 2020-08-13 VITALS — BP 122/69 | HR 84 | Temp 98.6°F | Resp 17 | Ht 69.0 in | Wt 214.8 lb

## 2020-08-13 DIAGNOSIS — I10 Essential (primary) hypertension: Secondary | ICD-10-CM | POA: Diagnosis not present

## 2020-08-13 DIAGNOSIS — Z952 Presence of prosthetic heart valve: Secondary | ICD-10-CM | POA: Diagnosis not present

## 2020-08-13 DIAGNOSIS — E782 Mixed hyperlipidemia: Secondary | ICD-10-CM

## 2020-08-13 DIAGNOSIS — I350 Nonrheumatic aortic (valve) stenosis: Secondary | ICD-10-CM

## 2020-08-13 NOTE — Progress Notes (Addendum)
Patient is here for follow up visit.  Subjective:   '@Patient'$  ID: [name] [name], male    DOB: [birth year], 87 y.o.   MRN: [id number]   Chief Complaint  Patient presents with   Severe aortic stenosis    1 year   TAVR   Follow-up    HPI  87 year old Caucasian male with hypertension, s/p TAVR (29 mm Edward Sapien-07/2019) for severe AS, bilateral asymptomatic mild-mod carotid stenosis.  Patient underwent successful TAVR in 07/2019. He is doing well and denies chest pain, shortness of breath, palpitations, leg edema, orthopnea, PND, TIA/syncope. Unfortunately, he lost his wife to stroke recently.   Reviewed recent labs and echocardiogram results with the patient, details below. Patient is doing well, denies chest pain, shortness of breath, palpitations, leg edema, orthopnea, PND, TIA/syncope.    Current Outpatient Medications on File Prior to Visit  Medication Sig Dispense Refill   aspirin EC 81 MG tablet Take 1 tablet (81 mg total) by mouth daily. 30 tablet 2   b complex vitamins tablet Take 1 tablet by mouth daily.     Biotin 10000 MCG TABS Take 10,000 mcg by mouth daily.     cetirizine (ZYRTEC) 10 MG tablet Take 10 mg by mouth daily.     cholecalciferol (VITAMIN D3) 25 MCG (1000 UNIT) tablet Take 3,000 Units by mouth daily.     clobetasol (TEMOVATE) 0.05 % external solution Apply 2.35 application topically daily as needed.     L-Lysine 500 MG CAPS Take 500 mg by mouth See admin instructions. Take 500 mg daily or as needed, may take 1000-1500 mg instead when experiencing a cold sore     metoprolol tartrate (LOPRESSOR) 25 MG tablet TAKE 1/2 TABLET BY MOUTH TWICE DAILY (Patient taking differently: Take 12.5 mg by mouth daily.) 90 tablet 2   Misc Natural Products (OSTEO BI-FLEX ADV TRIPLE ST) TABS Take 1 tablet by mouth daily.     Multiple Vitamin (MULTIVITAMIN WITH MINERALS) TABS tablet Take 1 tablet by mouth daily.     rosuvastatin (CRESTOR) 40 MG tablet Take 40 mg by mouth daily.      amoxicillin (AMOXIL) 500 MG tablet Take 4 capsules (2,000 mg) one hour prior to dental visits. (Patient not taking: Reported on 08/13/2020) 8 tablet 12   vitamin E 180 MG (400 UNITS) capsule Take 400 Units by mouth daily.     No current facility-administered medications on file prior to visit.    Cardiovascular studies:  Echocardiogram 07/25/2020:  Normal LV systolic function with visual EF 60-65%. Left ventricle cavity  is normal in size. Severe left ventricular hypertrophy. Normal global wall  motion. Indeterminate diastolic filling pattern, normal LAP.  Left atrial cavity is mildly dilated.  Bioprosthetic valve is well-seated, no evidence of dehiscence, no  perivalvular regurgitation (Edwards Sapien 3 THV size 29 mm, placed  07/2019). No evidence of aortic stenosis. No aortic valve regurgitation.  Mild (Grade I) mitral regurgitation.  Moderate tricuspid regurgitation. No evidence of pulmonary hypertension.  Moderate pulmonic regurgitation.  Compared to study dated 08/17/2019 Mild TR and PR are now moderate  otherwise no significant change.   EKG 02/16/2020: Sinus rhythm 67 bpm Cannot exclude old inferior infarct Otherwise normal EKG  Carotid artery duplex  09/30/2019:  Stenosis in the right internal carotid artery (50-69%), upper end of  spectrum. Stenosis in the right external carotid artery (<50%).  Stenosis in the left internal carotid artery (50-69%), lower end of  spectrum.  Antegrade right vertebral artery flow. Antegrade  left vertebral artery  flow.  Follow up in six months is appropriate if clinically indicated. No  significant change from 03/30/2019, minimal progression in the left carotid  Disease.  TAVR note 07/05/2019: Transfemoral TAVR Edwards Sapien 3 THV (size 29 mm)  RHC/Cor 06/21/2019: LM: Normal LAD: Medial calcification seen in prox LAD without significant stenosis         Mid LAD focal 40% stenosis LCX: Large, dominant.         Medial calcification  seen in prox LCX without significant stenosis RCA: Small, nondominant. Normal.   Normal right heart cath  Recent labs: 04/09/2020: Glucose N/A, BUN/Cr 18/1.1. EGFR 70.  HbA1C 5.5% Chol 142, TG 94, HDL 43, LDL 80 SH 0.6 normal  07/06/2019: Glucose 112, BUN/Cr 14/0.9. EGFR >60. Na/K 141/3.6. Rest of the CMP normal H/H 10.7/31.3. MCV 93. Platelets 117   Review of Systems  Cardiovascular:  Negative for chest pain, dyspnea on exertion, leg swelling, palpitations and syncope.      Objective:   Vitals:   08/13/20 1443  BP: 122/69  Pulse: 84  Resp: 17  Temp: 98.6 F (37 C)  SpO2: 97%     Physical Exam Vitals and nursing note reviewed.  Constitutional:      General: He is not in acute distress. Neck:     Vascular: No JVD.  Cardiovascular:     Rate and Rhythm: Normal rate and regular rhythm.     Heart sounds: Normal heart sounds. No murmur heard. Pulmonary:     Effort: Pulmonary effort is normal.     Breath sounds: Normal breath sounds. No wheezing or rales.        Assessment & Recommendations:   87 year old Caucasian male with hypertension, s/p TAVR (29 mm Edward Sapien-07/2019) for severe AS, bilateral asymptomatic mild-mod carotid stenosis.  Aortic stenosis: Now resolved s/p TAVR (07/2019). No dyspnea now. He has NYHA I symptoms. Reviewed echocardiogram 02/2020. Valve functioning well. No paravalvular leak.  Continue Aspirin  Hypertension: Well controlled.  MR, PR: Moderate, clinically asymptomatic  Carotid artery stenosis: Bilateral, asymptomatic.  Continue aspirin, statin.    Mixed hyperlipidemia: LDL 80 on Crestor 40 mg. Repeat lipid panel in 6 months Encouraged increased walking.  F/u in 6 months    Tallulah Hosman Esther Hardy, MD [facility] Pager: [phone] Office: [phone] If no answer Cell [phone]

## 2020-08-21 DIAGNOSIS — M5415 Radiculopathy, thoracolumbar region: Secondary | ICD-10-CM | POA: Diagnosis not present

## 2020-08-21 DIAGNOSIS — M5417 Radiculopathy, lumbosacral region: Secondary | ICD-10-CM | POA: Diagnosis not present

## 2020-08-21 DIAGNOSIS — M5136 Other intervertebral disc degeneration, lumbar region: Secondary | ICD-10-CM | POA: Diagnosis not present

## 2020-08-21 DIAGNOSIS — M9903 Segmental and somatic dysfunction of lumbar region: Secondary | ICD-10-CM | POA: Diagnosis not present

## 2020-08-21 DIAGNOSIS — M9905 Segmental and somatic dysfunction of pelvic region: Secondary | ICD-10-CM | POA: Diagnosis not present

## 2020-08-21 DIAGNOSIS — M9902 Segmental and somatic dysfunction of thoracic region: Secondary | ICD-10-CM | POA: Diagnosis not present

## 2020-09-07 DIAGNOSIS — H532 Diplopia: Secondary | ICD-10-CM | POA: Diagnosis not present

## 2020-09-07 DIAGNOSIS — H26493 Other secondary cataract, bilateral: Secondary | ICD-10-CM | POA: Diagnosis not present

## 2020-09-07 DIAGNOSIS — H524 Presbyopia: Secondary | ICD-10-CM | POA: Diagnosis not present

## 2020-09-18 DIAGNOSIS — H26491 Other secondary cataract, right eye: Secondary | ICD-10-CM | POA: Diagnosis not present

## 2020-09-27 DIAGNOSIS — M9902 Segmental and somatic dysfunction of thoracic region: Secondary | ICD-10-CM | POA: Diagnosis not present

## 2020-09-27 DIAGNOSIS — M9903 Segmental and somatic dysfunction of lumbar region: Secondary | ICD-10-CM | POA: Diagnosis not present

## 2020-09-27 DIAGNOSIS — M5136 Other intervertebral disc degeneration, lumbar region: Secondary | ICD-10-CM | POA: Diagnosis not present

## 2020-09-27 DIAGNOSIS — M5415 Radiculopathy, thoracolumbar region: Secondary | ICD-10-CM | POA: Diagnosis not present

## 2020-09-27 DIAGNOSIS — M5417 Radiculopathy, lumbosacral region: Secondary | ICD-10-CM | POA: Diagnosis not present

## 2020-09-27 DIAGNOSIS — M9905 Segmental and somatic dysfunction of pelvic region: Secondary | ICD-10-CM | POA: Diagnosis not present

## 2020-10-02 DIAGNOSIS — H26492 Other secondary cataract, left eye: Secondary | ICD-10-CM | POA: Diagnosis not present

## 2020-10-17 DIAGNOSIS — E669 Obesity, unspecified: Secondary | ICD-10-CM | POA: Diagnosis not present

## 2020-10-17 DIAGNOSIS — Z952 Presence of prosthetic heart valve: Secondary | ICD-10-CM | POA: Diagnosis not present

## 2020-10-17 DIAGNOSIS — E7800 Pure hypercholesterolemia, unspecified: Secondary | ICD-10-CM | POA: Diagnosis not present

## 2020-10-17 DIAGNOSIS — F5221 Male erectile disorder: Secondary | ICD-10-CM | POA: Diagnosis not present

## 2020-10-17 DIAGNOSIS — I350 Nonrheumatic aortic (valve) stenosis: Secondary | ICD-10-CM | POA: Diagnosis not present

## 2020-10-17 DIAGNOSIS — I517 Cardiomegaly: Secondary | ICD-10-CM | POA: Diagnosis not present

## 2020-10-17 DIAGNOSIS — I6523 Occlusion and stenosis of bilateral carotid arteries: Secondary | ICD-10-CM | POA: Diagnosis not present

## 2020-10-17 DIAGNOSIS — I10 Essential (primary) hypertension: Secondary | ICD-10-CM | POA: Diagnosis not present

## 2020-10-17 DIAGNOSIS — D126 Benign neoplasm of colon, unspecified: Secondary | ICD-10-CM | POA: Diagnosis not present

## 2020-10-25 DIAGNOSIS — M9902 Segmental and somatic dysfunction of thoracic region: Secondary | ICD-10-CM | POA: Diagnosis not present

## 2020-10-25 DIAGNOSIS — M5417 Radiculopathy, lumbosacral region: Secondary | ICD-10-CM | POA: Diagnosis not present

## 2020-10-25 DIAGNOSIS — M5415 Radiculopathy, thoracolumbar region: Secondary | ICD-10-CM | POA: Diagnosis not present

## 2020-10-25 DIAGNOSIS — M5136 Other intervertebral disc degeneration, lumbar region: Secondary | ICD-10-CM | POA: Diagnosis not present

## 2020-10-25 DIAGNOSIS — M9905 Segmental and somatic dysfunction of pelvic region: Secondary | ICD-10-CM | POA: Diagnosis not present

## 2020-10-25 DIAGNOSIS — M9903 Segmental and somatic dysfunction of lumbar region: Secondary | ICD-10-CM | POA: Diagnosis not present

## 2020-11-22 DIAGNOSIS — M5415 Radiculopathy, thoracolumbar region: Secondary | ICD-10-CM | POA: Diagnosis not present

## 2020-11-22 DIAGNOSIS — M9902 Segmental and somatic dysfunction of thoracic region: Secondary | ICD-10-CM | POA: Diagnosis not present

## 2020-11-22 DIAGNOSIS — M5417 Radiculopathy, lumbosacral region: Secondary | ICD-10-CM | POA: Diagnosis not present

## 2020-11-22 DIAGNOSIS — M9905 Segmental and somatic dysfunction of pelvic region: Secondary | ICD-10-CM | POA: Diagnosis not present

## 2020-11-22 DIAGNOSIS — M5136 Other intervertebral disc degeneration, lumbar region: Secondary | ICD-10-CM | POA: Diagnosis not present

## 2020-11-22 DIAGNOSIS — M9903 Segmental and somatic dysfunction of lumbar region: Secondary | ICD-10-CM | POA: Diagnosis not present

## 2020-12-19 ENCOUNTER — Other Ambulatory Visit: Payer: Self-pay | Admitting: Cardiology

## 2020-12-19 DIAGNOSIS — I10 Essential (primary) hypertension: Secondary | ICD-10-CM

## 2020-12-19 DIAGNOSIS — R000 Tachycardia, unspecified: Secondary | ICD-10-CM

## 2020-12-20 DIAGNOSIS — M9903 Segmental and somatic dysfunction of lumbar region: Secondary | ICD-10-CM | POA: Diagnosis not present

## 2020-12-20 DIAGNOSIS — M5136 Other intervertebral disc degeneration, lumbar region: Secondary | ICD-10-CM | POA: Diagnosis not present

## 2020-12-20 DIAGNOSIS — M9905 Segmental and somatic dysfunction of pelvic region: Secondary | ICD-10-CM | POA: Diagnosis not present

## 2020-12-20 DIAGNOSIS — M5415 Radiculopathy, thoracolumbar region: Secondary | ICD-10-CM | POA: Diagnosis not present

## 2020-12-20 DIAGNOSIS — M9902 Segmental and somatic dysfunction of thoracic region: Secondary | ICD-10-CM | POA: Diagnosis not present

## 2020-12-20 DIAGNOSIS — M5417 Radiculopathy, lumbosacral region: Secondary | ICD-10-CM | POA: Diagnosis not present

## 2021-01-16 DIAGNOSIS — M5417 Radiculopathy, lumbosacral region: Secondary | ICD-10-CM | POA: Diagnosis not present

## 2021-01-16 DIAGNOSIS — M5415 Radiculopathy, thoracolumbar region: Secondary | ICD-10-CM | POA: Diagnosis not present

## 2021-01-16 DIAGNOSIS — M9905 Segmental and somatic dysfunction of pelvic region: Secondary | ICD-10-CM | POA: Diagnosis not present

## 2021-01-16 DIAGNOSIS — M9903 Segmental and somatic dysfunction of lumbar region: Secondary | ICD-10-CM | POA: Diagnosis not present

## 2021-01-16 DIAGNOSIS — M9902 Segmental and somatic dysfunction of thoracic region: Secondary | ICD-10-CM | POA: Diagnosis not present

## 2021-01-16 DIAGNOSIS — M5136 Other intervertebral disc degeneration, lumbar region: Secondary | ICD-10-CM | POA: Diagnosis not present

## 2021-02-06 DIAGNOSIS — E782 Mixed hyperlipidemia: Secondary | ICD-10-CM | POA: Diagnosis not present

## 2021-02-07 LAB — LIPID PANEL
Chol/HDL Ratio: 3.2 ratio (ref 0.0–5.0)
Cholesterol, Total: 154 mg/dL (ref 100–199)
HDL: 48 mg/dL (ref >39)
LDL Chol Calc (NIH): 86 mg/dL (ref 0–99)
Triglycerides: 113 mg/dL (ref 0–149)
VLDL Cholesterol Cal: 20 mg/dL (ref 5–40)

## 2021-02-07 NOTE — Progress Notes (Signed)
Cholesterol is still high and will consider Zetia or enrolling you in a clinical trial. Will discuss on your OV soon

## 2021-02-13 ENCOUNTER — Ambulatory Visit: Payer: Medicare Other | Admitting: Cardiology

## 2021-02-13 DIAGNOSIS — M9905 Segmental and somatic dysfunction of pelvic region: Secondary | ICD-10-CM | POA: Diagnosis not present

## 2021-02-13 DIAGNOSIS — M5415 Radiculopathy, thoracolumbar region: Secondary | ICD-10-CM | POA: Diagnosis not present

## 2021-02-13 DIAGNOSIS — M5136 Other intervertebral disc degeneration, lumbar region: Secondary | ICD-10-CM | POA: Diagnosis not present

## 2021-02-13 DIAGNOSIS — M9903 Segmental and somatic dysfunction of lumbar region: Secondary | ICD-10-CM | POA: Diagnosis not present

## 2021-02-13 DIAGNOSIS — M9902 Segmental and somatic dysfunction of thoracic region: Secondary | ICD-10-CM | POA: Diagnosis not present

## 2021-02-13 DIAGNOSIS — M5417 Radiculopathy, lumbosacral region: Secondary | ICD-10-CM | POA: Diagnosis not present

## 2021-02-21 ENCOUNTER — Encounter: Payer: Self-pay | Admitting: Cardiology

## 2021-02-21 ENCOUNTER — Ambulatory Visit: Payer: Medicare Other | Admitting: Cardiology

## 2021-02-21 ENCOUNTER — Other Ambulatory Visit: Payer: Self-pay

## 2021-02-21 VITALS — BP 151/78 | HR 77 | Temp 97.8°F | Resp 16 | Ht 69.0 in | Wt 220.0 lb

## 2021-02-21 DIAGNOSIS — E782 Mixed hyperlipidemia: Secondary | ICD-10-CM

## 2021-02-21 DIAGNOSIS — I350 Nonrheumatic aortic (valve) stenosis: Secondary | ICD-10-CM

## 2021-02-21 DIAGNOSIS — I10 Essential (primary) hypertension: Secondary | ICD-10-CM | POA: Diagnosis not present

## 2021-02-21 DIAGNOSIS — Z952 Presence of prosthetic heart valve: Secondary | ICD-10-CM

## 2021-02-21 NOTE — Progress Notes (Signed)
Patient is here for follow up visit.  Subjective:   '@Patient'$  ID: [name] [name], male    DOB: Jul 10, 1931, 87 y.o.   MRN: [id number]   Chief Complaint  Patient presents with   Hyperlipidemia   Follow-up    6 months    HPI  87 year old Caucasian male with hypertension, s/p TAVR (29 mm Edward Sapien-07/2019) for severe AS, bilateral asymptomatic mild-mod carotid stenosis.  Patient underwent successful TAVR in 07/2019. He is doing well and denies chest pain, shortness of breath, palpitations, leg edema, orthopnea, PND, TIA/syncope. Unfortunately, he lost his wife to stroke recently.   Patient is doing well.  Since his wife passed, he has been eating 2 meals a day.  While he cooked for last 20 years, he states that it is "painful" to cook for 1 person every day.  He eats 1 meal with his friends outside and cooks the other meal at home himself.  Blood pressure is elevated today.  His historical log, which includes roughly 3-4 readings a month, do show upward trend in recent months.  However, he is not too keen on starting additional medications at this time.  He walks about a mile every day without any symptoms of chest pain or shortness of breath.  Current Outpatient Medications on File Prior to Visit  Medication Sig Dispense Refill   amoxicillin (AMOXIL) 500 MG tablet Take 4 capsules (2,000 mg) one hour prior to dental visits. (Patient not taking: Reported on 08/13/2020) 8 tablet 12   aspirin EC 81 MG tablet Take 1 tablet (81 mg total) by mouth daily. 30 tablet 2   b complex vitamins tablet Take 1 tablet by mouth daily.     Biotin 10000 MCG TABS Take 10,000 mcg by mouth daily.     cetirizine (ZYRTEC) 10 MG tablet Take 10 mg by mouth daily.     cholecalciferol (VITAMIN D3) 25 MCG (1000 UNIT) tablet Take 3,000 Units by mouth daily.     clobetasol (TEMOVATE) 0.05 % external solution Apply 9.47 application topically daily as needed.     L-Lysine 500 MG CAPS Take 500 mg by mouth See admin  instructions. Take 500 mg daily or as needed, may take 1000-1500 mg instead when experiencing a cold sore     metoprolol tartrate (LOPRESSOR) 25 MG tablet TAKE 1/2 TABLET BY MOUTH TWICE A DAY 90 tablet 2   Misc Natural Products (OSTEO BI-FLEX ADV TRIPLE ST) TABS Take 1 tablet by mouth daily.     Multiple Vitamin (MULTIVITAMIN WITH MINERALS) TABS tablet Take 1 tablet by mouth daily.     rosuvastatin (CRESTOR) 40 MG tablet Take 40 mg by mouth daily.     vitamin E 180 MG (400 UNITS) capsule Take 400 Units by mouth daily.     No current facility-administered medications on file prior to visit.    Cardiovascular studies:  EKG 02/21/2021: Sinus rhythm 72 bpm  Old inferior infarct  Echocardiogram 07/25/2020:  Normal LV systolic function with visual EF 60-65%. Left ventricle cavity  is normal in size. Severe left ventricular hypertrophy. Normal global wall  motion. Indeterminate diastolic filling pattern, normal LAP.  Left atrial cavity is mildly dilated.  Bioprosthetic valve is well-seated, no evidence of dehiscence, no  perivalvular regurgitation (Edwards Sapien 3 THV size 29 mm, placed  07/2019). No evidence of aortic stenosis. No aortic valve regurgitation.  Mild (Grade I) mitral regurgitation.  Moderate tricuspid regurgitation. No evidence of pulmonary hypertension.  Moderate pulmonic regurgitation.  Compared to  study dated 08/17/2019 Mild TR and PR are now moderate  otherwise no significant change.   EKG 02/16/2020: Sinus rhythm 67 bpm Cannot exclude old inferior infarct Otherwise normal EKG  Carotid artery duplex  09/30/2019:  Stenosis in the right internal carotid artery (50-69%), upper end of  spectrum. Stenosis in the right external carotid artery (<50%).  Stenosis in the left internal carotid artery (50-69%), lower end of  spectrum.  Antegrade right vertebral artery flow. Antegrade left vertebral artery  flow.  Follow up in six months is appropriate if clinically indicated.  No  significant change from 03/30/2019, minimal progression in the left carotid  Disease.  TAVR note 07/05/2019: Transfemoral TAVR Edwards Sapien 3 THV (size 29 mm)  RHC/Cor 06/21/2019: LM: Normal LAD: Medial calcification seen in prox LAD without significant stenosis         Mid LAD focal 40% stenosis LCX: Large, dominant.         Medial calcification seen in prox LCX without significant stenosis RCA: Small, nondominant. Normal.   Normal right heart cath  Recent labs: 02/06/2021: Chol 154, TG 113, HDL 48, LDL 86  04/09/2020: Glucose N/A, BUN/Cr 18/1.1. EGFR 70.  HbA1C 5.5% Chol 142, TG 94, HDL 43, LDL 80 SH 0.6 normal  07/06/2019: Glucose 112, BUN/Cr 14/0.9. EGFR >60. Na/K 141/3.6. Rest of the CMP normal H/H 10.7/31.3. MCV 93. Platelets 117   Review of Systems  Cardiovascular:  Negative for chest pain, dyspnea on exertion, leg swelling, palpitations and syncope.      Objective:   Vitals:   02/21/21 1356  BP: (!) 151/78  Pulse: 77  Resp: 16  Temp: 97.8 F (36.6 C)  SpO2: 96%     Physical Exam Vitals and nursing note reviewed.  Constitutional:      General: He is not in acute distress. Neck:     Vascular: No JVD.  Cardiovascular:     Rate and Rhythm: Normal rate and regular rhythm.     Heart sounds: Normal heart sounds. No murmur heard. Pulmonary:     Effort: Pulmonary effort is normal.     Breath sounds: Normal breath sounds. No wheezing or rales.  Musculoskeletal:     Right lower leg: No edema.     Left lower leg: No edema.        Assessment & Recommendations:   87 year old Caucasian male with hypertension, s/p TAVR (29 mm Edward Sapien-07/2019) for severe AS, bilateral asymptomatic mild-mod carotid stenosis.  Aortic stenosis: Now resolved s/p TAVR (07/2019). No dyspnea now. He has NYHA I symptoms. Valve functioning well. No paravalvular leak. (07/2020) Continue Aspirin  Hypertension: Uncontrolled.  We discussed addition of antihypertensive agent, or  increasing dose of metoprolol which he is on for previous episodes of tachycardia.  He is not too keen on additional therapy at this time.  We mutually agreed on home monitoring and close follow-up.  I discussed ways to reduce salt intake.  It is difficult for him to completely eliminate eating outside.  However, we talked about avoiding things that have high salt content.  MR, PR: Moderate, clinically asymptomatic  Carotid artery stenosis: Bilateral, asymptomatic.  Continue aspirin, statin.    Mixed hyperlipidemia: LDL 86 on Crestor 40 mg.    F/u in 4 weeks   Orlie Cundari Esther Hardy, MD [facility] Pager: [phone] Office: [phone] If no answer Cell [phone]

## 2021-03-05 DIAGNOSIS — L570 Actinic keratosis: Secondary | ICD-10-CM | POA: Diagnosis not present

## 2021-03-05 DIAGNOSIS — Z1283 Encounter for screening for malignant neoplasm of skin: Secondary | ICD-10-CM | POA: Diagnosis not present

## 2021-03-05 DIAGNOSIS — C44529 Squamous cell carcinoma of skin of other part of trunk: Secondary | ICD-10-CM | POA: Diagnosis not present

## 2021-03-05 DIAGNOSIS — L820 Inflamed seborrheic keratosis: Secondary | ICD-10-CM | POA: Diagnosis not present

## 2021-03-05 DIAGNOSIS — X32XXXD Exposure to sunlight, subsequent encounter: Secondary | ICD-10-CM | POA: Diagnosis not present

## 2021-03-13 DIAGNOSIS — M5136 Other intervertebral disc degeneration, lumbar region: Secondary | ICD-10-CM | POA: Diagnosis not present

## 2021-03-13 DIAGNOSIS — M5417 Radiculopathy, lumbosacral region: Secondary | ICD-10-CM | POA: Diagnosis not present

## 2021-03-13 DIAGNOSIS — M9902 Segmental and somatic dysfunction of thoracic region: Secondary | ICD-10-CM | POA: Diagnosis not present

## 2021-03-13 DIAGNOSIS — M9905 Segmental and somatic dysfunction of pelvic region: Secondary | ICD-10-CM | POA: Diagnosis not present

## 2021-03-13 DIAGNOSIS — M5415 Radiculopathy, thoracolumbar region: Secondary | ICD-10-CM | POA: Diagnosis not present

## 2021-03-13 DIAGNOSIS — M9903 Segmental and somatic dysfunction of lumbar region: Secondary | ICD-10-CM | POA: Diagnosis not present

## 2021-03-22 ENCOUNTER — Other Ambulatory Visit: Payer: Self-pay

## 2021-03-22 ENCOUNTER — Encounter: Payer: Self-pay | Admitting: Cardiology

## 2021-03-22 ENCOUNTER — Ambulatory Visit: Payer: Medicare Other | Admitting: Cardiology

## 2021-03-22 VITALS — BP 140/72 | HR 71 | Temp 97.8°F | Resp 16 | Ht 69.0 in | Wt 213.0 lb

## 2021-03-22 DIAGNOSIS — I350 Nonrheumatic aortic (valve) stenosis: Secondary | ICD-10-CM | POA: Diagnosis not present

## 2021-03-22 DIAGNOSIS — E782 Mixed hyperlipidemia: Secondary | ICD-10-CM

## 2021-03-22 DIAGNOSIS — Z952 Presence of prosthetic heart valve: Secondary | ICD-10-CM | POA: Diagnosis not present

## 2021-03-22 DIAGNOSIS — I10 Essential (primary) hypertension: Secondary | ICD-10-CM | POA: Diagnosis not present

## 2021-03-22 NOTE — Progress Notes (Signed)
Patient is here for follow up visit.  Subjective:   '@Patient'$  ID: Dierdre Searles, male    DOB: 12-13-1931, 88 y.o.   MRN: [id number]   Chief Complaint  Patient presents with   Aortic Stenosis   Follow-up    4 week    HPI  88 year old Caucasian male with hypertension, s/p TAVR (29 mm Edward Sapien-07/2019) for severe AS, bilateral asymptomatic mild-mod carotid stenosis.  Patient has made changes to his diet, by reducing alcohol intake, substituting chips with carrots etc. Blood pressure has improved.He denies chest pain, shortness of breath, palpitations, leg edema, orthopnea, PND, TIA/syncope.  Current Outpatient Medications on File Prior to Visit  Medication Sig Dispense Refill   amoxicillin (AMOXIL) 500 MG tablet Take 4 capsules (2,000 mg) one hour prior to dental visits. 8 tablet 12   aspirin EC 81 MG tablet Take 1 tablet (81 mg total) by mouth daily. 30 tablet 2   b complex vitamins tablet Take 1 tablet by mouth daily.     Biotin 10000 MCG TABS Take 10,000 mcg by mouth daily.     cetirizine (ZYRTEC) 10 MG tablet Take 10 mg by mouth daily.     cholecalciferol (VITAMIN D3) 25 MCG (1000 UNIT) tablet Take 3,000 Units by mouth daily.     clobetasol (TEMOVATE) 0.05 % external solution Apply 9.83 application topically daily as needed.     L-Lysine 500 MG CAPS Take 500 mg by mouth See admin instructions. Take 500 mg daily or as needed, may take 1000-1500 mg instead when experiencing a cold sore     metoprolol tartrate (LOPRESSOR) 25 MG tablet TAKE 1/2 TABLET BY MOUTH TWICE A DAY 90 tablet 2   Misc Natural Products (OSTEO BI-FLEX ADV TRIPLE ST) TABS Take 1 tablet by mouth daily.     Multiple Vitamin (MULTIVITAMIN WITH MINERALS) TABS tablet Take 1 tablet by mouth daily.     NON FORMULARY Balance nature     rosuvastatin (CRESTOR) 40 MG tablet Take 40 mg by mouth daily.     vitamin E 180 MG (400 UNITS) capsule Take 400 Units by mouth daily.     No current facility-administered  medications on file prior to visit.    Cardiovascular studies:  EKG 02/21/2021: Sinus rhythm 72 bpm  Old inferior infarct  Echocardiogram 07/25/2020:  Normal LV systolic function with visual EF 60-65%. Left ventricle cavity  is normal in size. Severe left ventricular hypertrophy. Normal global wall  motion. Indeterminate diastolic filling pattern, normal LAP.  Left atrial cavity is mildly dilated.  Bioprosthetic valve is well-seated, no evidence of dehiscence, no  perivalvular regurgitation (Edwards Sapien 3 THV size 29 mm, placed  07/2019). No evidence of aortic stenosis. No aortic valve regurgitation.  Mild (Grade I) mitral regurgitation.  Moderate tricuspid regurgitation. No evidence of pulmonary hypertension.  Moderate pulmonic regurgitation.  Compared to study dated 08/17/2019 Mild TR and PR are now moderate  otherwise no significant change.   EKG 02/16/2020: Sinus rhythm 67 bpm Cannot exclude old inferior infarct Otherwise normal EKG  Carotid artery duplex  09/30/2019:  Stenosis in the right internal carotid artery (50-69%), upper end of  spectrum. Stenosis in the right external carotid artery (<50%).  Stenosis in the left internal carotid artery (50-69%), lower end of  spectrum.  Antegrade right vertebral artery flow. Antegrade left vertebral artery  flow.  Follow up in six months is appropriate if clinically indicated. No  significant change from 03/30/2019, minimal progression in the left carotid  Disease.  TAVR note 07/05/2019: Transfemoral TAVR Edwards Sapien 3 THV (size 29 mm)  RHC/Cor 06/21/2019: LM: Normal LAD: Medial calcification seen in prox LAD without significant stenosis         Mid LAD focal 40% stenosis LCX: Large, dominant.         Medial calcification seen in prox LCX without significant stenosis RCA: Small, nondominant. Normal.   Normal right heart cath  Recent labs: 02/06/2021: Chol 154, TG 113, HDL 48, LDL 86  04/09/2020: Glucose N/A, BUN/Cr  18/1.1. EGFR 70.  HbA1C 5.5% Chol 142, TG 94, HDL 43, LDL 80 SH 0.6 normal  07/06/2019: Glucose 112, BUN/Cr 14/0.9. EGFR >60. Na/K 141/3.6. Rest of the CMP normal H/H 10.7/31.3. MCV 93. Platelets 117   Review of Systems  Cardiovascular:  Negative for chest pain, dyspnea on exertion, leg swelling, palpitations and syncope.      Objective:   There were no vitals filed for this visit.    Physical Exam Vitals and nursing note reviewed.  Constitutional:      General: He is not in acute distress. Neck:     Vascular: No JVD.  Cardiovascular:     Rate and Rhythm: Normal rate and regular rhythm.     Heart sounds: Normal heart sounds. No murmur heard. Pulmonary:     Effort: Pulmonary effort is normal.     Breath sounds: Normal breath sounds. No wheezing or rales.  Musculoskeletal:     Right lower leg: No edema.     Left lower leg: No edema.        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  Aortic stenosis: Now resolved s/p TAVR (07/2019). No dyspnea now. He has NYHA I symptoms. Valve functioning well. No paravalvular leak. (07/2020) Continue Aspirin  Hypertension: Improved with dietary change. No further change made to medical therapy today.   MR, PR: Moderate, clinically asymptomatic  Carotid artery stenosis: Bilateral, asymptomatic.  Continue aspirin, statin.    Mixed hyperlipidemia: LDL 86 on Crestor 40 mg.    F/u in 1 year   [name], MD [facility] Pager: [phone] Office: [phone] If no answer Cell [phone]

## 2021-04-02 DIAGNOSIS — Z85828 Personal history of other malignant neoplasm of skin: Secondary | ICD-10-CM | POA: Diagnosis not present

## 2021-04-02 DIAGNOSIS — Z08 Encounter for follow-up examination after completed treatment for malignant neoplasm: Secondary | ICD-10-CM | POA: Diagnosis not present

## 2021-04-10 DIAGNOSIS — M9905 Segmental and somatic dysfunction of pelvic region: Secondary | ICD-10-CM | POA: Diagnosis not present

## 2021-04-10 DIAGNOSIS — M9902 Segmental and somatic dysfunction of thoracic region: Secondary | ICD-10-CM | POA: Diagnosis not present

## 2021-04-10 DIAGNOSIS — M9903 Segmental and somatic dysfunction of lumbar region: Secondary | ICD-10-CM | POA: Diagnosis not present

## 2021-04-10 DIAGNOSIS — M5417 Radiculopathy, lumbosacral region: Secondary | ICD-10-CM | POA: Diagnosis not present

## 2021-04-10 DIAGNOSIS — M5136 Other intervertebral disc degeneration, lumbar region: Secondary | ICD-10-CM | POA: Diagnosis not present

## 2021-04-10 DIAGNOSIS — M5415 Radiculopathy, thoracolumbar region: Secondary | ICD-10-CM | POA: Diagnosis not present

## 2021-05-08 DIAGNOSIS — Z125 Encounter for screening for malignant neoplasm of prostate: Secondary | ICD-10-CM | POA: Diagnosis not present

## 2021-05-08 DIAGNOSIS — M9903 Segmental and somatic dysfunction of lumbar region: Secondary | ICD-10-CM | POA: Diagnosis not present

## 2021-05-08 DIAGNOSIS — M9902 Segmental and somatic dysfunction of thoracic region: Secondary | ICD-10-CM | POA: Diagnosis not present

## 2021-05-08 DIAGNOSIS — M5415 Radiculopathy, thoracolumbar region: Secondary | ICD-10-CM | POA: Diagnosis not present

## 2021-05-08 DIAGNOSIS — I10 Essential (primary) hypertension: Secondary | ICD-10-CM | POA: Diagnosis not present

## 2021-05-08 DIAGNOSIS — E7800 Pure hypercholesterolemia, unspecified: Secondary | ICD-10-CM | POA: Diagnosis not present

## 2021-05-08 DIAGNOSIS — M5136 Other intervertebral disc degeneration, lumbar region: Secondary | ICD-10-CM | POA: Diagnosis not present

## 2021-05-08 DIAGNOSIS — M9905 Segmental and somatic dysfunction of pelvic region: Secondary | ICD-10-CM | POA: Diagnosis not present

## 2021-05-08 DIAGNOSIS — M5417 Radiculopathy, lumbosacral region: Secondary | ICD-10-CM | POA: Diagnosis not present

## 2021-05-10 DIAGNOSIS — Z1331 Encounter for screening for depression: Secondary | ICD-10-CM | POA: Diagnosis not present

## 2021-05-10 DIAGNOSIS — E669 Obesity, unspecified: Secondary | ICD-10-CM | POA: Diagnosis not present

## 2021-05-10 DIAGNOSIS — I350 Nonrheumatic aortic (valve) stenosis: Secondary | ICD-10-CM | POA: Diagnosis not present

## 2021-05-10 DIAGNOSIS — Z952 Presence of prosthetic heart valve: Secondary | ICD-10-CM | POA: Diagnosis not present

## 2021-05-10 DIAGNOSIS — F5221 Male erectile disorder: Secondary | ICD-10-CM | POA: Diagnosis not present

## 2021-05-10 DIAGNOSIS — Z1339 Encounter for screening examination for other mental health and behavioral disorders: Secondary | ICD-10-CM | POA: Diagnosis not present

## 2021-05-10 DIAGNOSIS — Z0000 Encounter for general adult medical examination without abnormal findings: Secondary | ICD-10-CM | POA: Diagnosis not present

## 2021-05-10 DIAGNOSIS — I6523 Occlusion and stenosis of bilateral carotid arteries: Secondary | ICD-10-CM | POA: Diagnosis not present

## 2021-05-10 DIAGNOSIS — E7800 Pure hypercholesterolemia, unspecified: Secondary | ICD-10-CM | POA: Diagnosis not present

## 2021-05-10 DIAGNOSIS — I10 Essential (primary) hypertension: Secondary | ICD-10-CM | POA: Diagnosis not present

## 2021-05-10 DIAGNOSIS — I517 Cardiomegaly: Secondary | ICD-10-CM | POA: Diagnosis not present

## 2021-05-10 DIAGNOSIS — D126 Benign neoplasm of colon, unspecified: Secondary | ICD-10-CM | POA: Diagnosis not present

## 2021-05-13 DIAGNOSIS — E7800 Pure hypercholesterolemia, unspecified: Secondary | ICD-10-CM | POA: Diagnosis not present

## 2021-05-13 DIAGNOSIS — R82998 Other abnormal findings in urine: Secondary | ICD-10-CM | POA: Diagnosis not present

## 2021-05-13 DIAGNOSIS — I10 Essential (primary) hypertension: Secondary | ICD-10-CM | POA: Diagnosis not present

## 2021-05-13 DIAGNOSIS — Z125 Encounter for screening for malignant neoplasm of prostate: Secondary | ICD-10-CM | POA: Diagnosis not present

## 2021-06-05 DIAGNOSIS — M5417 Radiculopathy, lumbosacral region: Secondary | ICD-10-CM | POA: Diagnosis not present

## 2021-06-05 DIAGNOSIS — M5415 Radiculopathy, thoracolumbar region: Secondary | ICD-10-CM | POA: Diagnosis not present

## 2021-06-05 DIAGNOSIS — M5136 Other intervertebral disc degeneration, lumbar region: Secondary | ICD-10-CM | POA: Diagnosis not present

## 2021-06-05 DIAGNOSIS — M9902 Segmental and somatic dysfunction of thoracic region: Secondary | ICD-10-CM | POA: Diagnosis not present

## 2021-06-05 DIAGNOSIS — M9903 Segmental and somatic dysfunction of lumbar region: Secondary | ICD-10-CM | POA: Diagnosis not present

## 2021-06-05 DIAGNOSIS — M9905 Segmental and somatic dysfunction of pelvic region: Secondary | ICD-10-CM | POA: Diagnosis not present

## 2021-06-18 DIAGNOSIS — L820 Inflamed seborrheic keratosis: Secondary | ICD-10-CM | POA: Diagnosis not present

## 2021-06-18 DIAGNOSIS — X32XXXD Exposure to sunlight, subsequent encounter: Secondary | ICD-10-CM | POA: Diagnosis not present

## 2021-06-18 DIAGNOSIS — D225 Melanocytic nevi of trunk: Secondary | ICD-10-CM | POA: Diagnosis not present

## 2021-06-18 DIAGNOSIS — Z85820 Personal history of malignant melanoma of skin: Secondary | ICD-10-CM | POA: Diagnosis not present

## 2021-06-18 DIAGNOSIS — Z1283 Encounter for screening for malignant neoplasm of skin: Secondary | ICD-10-CM | POA: Diagnosis not present

## 2021-06-18 DIAGNOSIS — Z08 Encounter for follow-up examination after completed treatment for malignant neoplasm: Secondary | ICD-10-CM | POA: Diagnosis not present

## 2021-06-18 DIAGNOSIS — L570 Actinic keratosis: Secondary | ICD-10-CM | POA: Diagnosis not present

## 2021-07-03 DIAGNOSIS — M9905 Segmental and somatic dysfunction of pelvic region: Secondary | ICD-10-CM | POA: Diagnosis not present

## 2021-07-03 DIAGNOSIS — M9903 Segmental and somatic dysfunction of lumbar region: Secondary | ICD-10-CM | POA: Diagnosis not present

## 2021-07-03 DIAGNOSIS — M5136 Other intervertebral disc degeneration, lumbar region: Secondary | ICD-10-CM | POA: Diagnosis not present

## 2021-07-03 DIAGNOSIS — M5415 Radiculopathy, thoracolumbar region: Secondary | ICD-10-CM | POA: Diagnosis not present

## 2021-07-03 DIAGNOSIS — M5417 Radiculopathy, lumbosacral region: Secondary | ICD-10-CM | POA: Diagnosis not present

## 2021-07-03 DIAGNOSIS — M9902 Segmental and somatic dysfunction of thoracic region: Secondary | ICD-10-CM | POA: Diagnosis not present

## 2021-07-31 DIAGNOSIS — M9903 Segmental and somatic dysfunction of lumbar region: Secondary | ICD-10-CM | POA: Diagnosis not present

## 2021-07-31 DIAGNOSIS — M5415 Radiculopathy, thoracolumbar region: Secondary | ICD-10-CM | POA: Diagnosis not present

## 2021-07-31 DIAGNOSIS — M9902 Segmental and somatic dysfunction of thoracic region: Secondary | ICD-10-CM | POA: Diagnosis not present

## 2021-07-31 DIAGNOSIS — M9905 Segmental and somatic dysfunction of pelvic region: Secondary | ICD-10-CM | POA: Diagnosis not present

## 2021-07-31 DIAGNOSIS — M5136 Other intervertebral disc degeneration, lumbar region: Secondary | ICD-10-CM | POA: Diagnosis not present

## 2021-07-31 DIAGNOSIS — M5417 Radiculopathy, lumbosacral region: Secondary | ICD-10-CM | POA: Diagnosis not present

## 2021-08-27 DIAGNOSIS — M5136 Other intervertebral disc degeneration, lumbar region: Secondary | ICD-10-CM | POA: Diagnosis not present

## 2021-08-27 DIAGNOSIS — M9902 Segmental and somatic dysfunction of thoracic region: Secondary | ICD-10-CM | POA: Diagnosis not present

## 2021-08-27 DIAGNOSIS — M5417 Radiculopathy, lumbosacral region: Secondary | ICD-10-CM | POA: Diagnosis not present

## 2021-08-27 DIAGNOSIS — M9903 Segmental and somatic dysfunction of lumbar region: Secondary | ICD-10-CM | POA: Diagnosis not present

## 2021-08-27 DIAGNOSIS — M9905 Segmental and somatic dysfunction of pelvic region: Secondary | ICD-10-CM | POA: Diagnosis not present

## 2021-08-27 DIAGNOSIS — M5415 Radiculopathy, thoracolumbar region: Secondary | ICD-10-CM | POA: Diagnosis not present

## 2021-09-24 DIAGNOSIS — M9903 Segmental and somatic dysfunction of lumbar region: Secondary | ICD-10-CM | POA: Diagnosis not present

## 2021-09-24 DIAGNOSIS — M9905 Segmental and somatic dysfunction of pelvic region: Secondary | ICD-10-CM | POA: Diagnosis not present

## 2021-09-24 DIAGNOSIS — M5417 Radiculopathy, lumbosacral region: Secondary | ICD-10-CM | POA: Diagnosis not present

## 2021-09-24 DIAGNOSIS — M5136 Other intervertebral disc degeneration, lumbar region: Secondary | ICD-10-CM | POA: Diagnosis not present

## 2021-09-24 DIAGNOSIS — M5415 Radiculopathy, thoracolumbar region: Secondary | ICD-10-CM | POA: Diagnosis not present

## 2021-09-24 DIAGNOSIS — M9902 Segmental and somatic dysfunction of thoracic region: Secondary | ICD-10-CM | POA: Diagnosis not present

## 2021-10-25 ENCOUNTER — Other Ambulatory Visit: Payer: Self-pay

## 2021-10-25 ENCOUNTER — Telehealth: Payer: Self-pay | Admitting: Cardiology

## 2021-10-25 DIAGNOSIS — I10 Essential (primary) hypertension: Secondary | ICD-10-CM

## 2021-10-25 DIAGNOSIS — R000 Tachycardia, unspecified: Secondary | ICD-10-CM

## 2021-10-25 MED ORDER — METOPROLOL TARTRATE 25 MG PO TABS: 12.5000 mg | ORAL_TABLET | Freq: Two times a day (BID) | ORAL | 2 refills | Status: DC

## 2021-10-25 NOTE — Telephone Encounter (Signed)
Medication has been refilled.

## 2021-11-13 DIAGNOSIS — I6523 Occlusion and stenosis of bilateral carotid arteries: Secondary | ICD-10-CM | POA: Diagnosis not present

## 2021-11-13 DIAGNOSIS — M5417 Radiculopathy, lumbosacral region: Secondary | ICD-10-CM | POA: Diagnosis not present

## 2021-11-13 DIAGNOSIS — M5136 Other intervertebral disc degeneration, lumbar region: Secondary | ICD-10-CM | POA: Diagnosis not present

## 2021-11-13 DIAGNOSIS — M5415 Radiculopathy, thoracolumbar region: Secondary | ICD-10-CM | POA: Diagnosis not present

## 2021-11-13 DIAGNOSIS — D126 Benign neoplasm of colon, unspecified: Secondary | ICD-10-CM | POA: Diagnosis not present

## 2021-11-13 DIAGNOSIS — R0982 Postnasal drip: Secondary | ICD-10-CM | POA: Diagnosis not present

## 2021-11-13 DIAGNOSIS — I10 Essential (primary) hypertension: Secondary | ICD-10-CM | POA: Diagnosis not present

## 2021-11-13 DIAGNOSIS — E669 Obesity, unspecified: Secondary | ICD-10-CM | POA: Diagnosis not present

## 2021-11-13 DIAGNOSIS — M9905 Segmental and somatic dysfunction of pelvic region: Secondary | ICD-10-CM | POA: Diagnosis not present

## 2021-11-13 DIAGNOSIS — Z952 Presence of prosthetic heart valve: Secondary | ICD-10-CM | POA: Diagnosis not present

## 2021-11-13 DIAGNOSIS — M9903 Segmental and somatic dysfunction of lumbar region: Secondary | ICD-10-CM | POA: Diagnosis not present

## 2021-11-13 DIAGNOSIS — I517 Cardiomegaly: Secondary | ICD-10-CM | POA: Diagnosis not present

## 2021-11-13 DIAGNOSIS — F5221 Male erectile disorder: Secondary | ICD-10-CM | POA: Diagnosis not present

## 2021-11-13 DIAGNOSIS — M9902 Segmental and somatic dysfunction of thoracic region: Secondary | ICD-10-CM | POA: Diagnosis not present

## 2021-11-13 DIAGNOSIS — E7800 Pure hypercholesterolemia, unspecified: Secondary | ICD-10-CM | POA: Diagnosis not present

## 2021-11-13 DIAGNOSIS — I350 Nonrheumatic aortic (valve) stenosis: Secondary | ICD-10-CM | POA: Diagnosis not present

## 2021-11-15 DIAGNOSIS — D23112 Other benign neoplasm of skin of right lower eyelid, including canthus: Secondary | ICD-10-CM | POA: Diagnosis not present

## 2021-11-15 DIAGNOSIS — Z961 Presence of intraocular lens: Secondary | ICD-10-CM | POA: Diagnosis not present

## 2021-11-15 DIAGNOSIS — H532 Diplopia: Secondary | ICD-10-CM | POA: Diagnosis not present

## 2021-11-15 DIAGNOSIS — H52203 Unspecified astigmatism, bilateral: Secondary | ICD-10-CM | POA: Diagnosis not present

## 2021-11-15 DIAGNOSIS — H524 Presbyopia: Secondary | ICD-10-CM | POA: Diagnosis not present

## 2021-11-15 DIAGNOSIS — H43813 Vitreous degeneration, bilateral: Secondary | ICD-10-CM | POA: Diagnosis not present

## 2021-12-11 DIAGNOSIS — M9903 Segmental and somatic dysfunction of lumbar region: Secondary | ICD-10-CM | POA: Diagnosis not present

## 2021-12-11 DIAGNOSIS — M5136 Other intervertebral disc degeneration, lumbar region: Secondary | ICD-10-CM | POA: Diagnosis not present

## 2021-12-11 DIAGNOSIS — M9905 Segmental and somatic dysfunction of pelvic region: Secondary | ICD-10-CM | POA: Diagnosis not present

## 2021-12-11 DIAGNOSIS — M5417 Radiculopathy, lumbosacral region: Secondary | ICD-10-CM | POA: Diagnosis not present

## 2021-12-11 DIAGNOSIS — M9902 Segmental and somatic dysfunction of thoracic region: Secondary | ICD-10-CM | POA: Diagnosis not present

## 2021-12-11 DIAGNOSIS — M5415 Radiculopathy, thoracolumbar region: Secondary | ICD-10-CM | POA: Diagnosis not present

## 2021-12-24 ENCOUNTER — Ambulatory Visit: Payer: Medicare Other | Admitting: Cardiology

## 2021-12-24 ENCOUNTER — Encounter: Payer: Self-pay | Admitting: Cardiology

## 2021-12-24 ENCOUNTER — Telehealth: Payer: Self-pay

## 2021-12-24 VITALS — BP 136/79 | HR 66 | Temp 97.8°F | Ht 69.0 in | Wt 208.4 lb

## 2021-12-24 DIAGNOSIS — I350 Nonrheumatic aortic (valve) stenosis: Secondary | ICD-10-CM

## 2021-12-24 DIAGNOSIS — I10 Essential (primary) hypertension: Secondary | ICD-10-CM | POA: Diagnosis not present

## 2021-12-24 MED ORDER — LOSARTAN POTASSIUM 25 MG PO TABS: 25.0000 mg | ORAL_TABLET | Freq: Every day | ORAL | 3 refills | Status: DC

## 2021-12-24 NOTE — Telephone Encounter (Signed)
Patient called to report some recent elevated BP readings:  Today : 183/86 2 minutes.Marland Kitchen..144/86  Yesterday : 170/83 2 minutes .Marland KitchenMarland Kitchen..140/83  He stated that he sometimes gets "lightheaded" for a about a 1 minute or less.   Would you like for me to schedule patient to be see this week or next week?  Patient is an avid exerciser and wants to know if he should take a break. Please advise.

## 2021-12-24 NOTE — Telephone Encounter (Signed)
Note  Yes please. Okay to add today or tomorrow.

## 2021-12-24 NOTE — Telephone Encounter (Signed)
Yes please. Okay to add today or tomorrow.   Thanks MJP

## 2021-12-24 NOTE — Telephone Encounter (Signed)
Can you see about getting him scheduled with Dr. Virgina Jock

## 2021-12-24 NOTE — Progress Notes (Signed)
Patient is here for follow up visit.  Subjective:   _0  ID: [name] [name], male    DOB: 1931/12/25, 88 y.o.   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  Patient has had high BP upto 180s/80s, improved to 140s/80s. He has had occasional lightheadedness but no other symptoms.   Current Outpatient Medications:    amoxicillin (AMOXIL) 500 MG tablet, Take 4 capsules (2,000 mg) one hour prior to dental visits., Disp: 8 tablet, Rfl: 12   aspirin EC 81 MG tablet, Take 1 tablet (81 mg total) by mouth daily., Disp: 30 tablet, Rfl: 2   b complex vitamins tablet, Take 1 tablet by mouth daily., Disp: , Rfl:    Biotin 10000 MCG TABS, Take 10,000 mcg by mouth daily., Disp: , Rfl:    cholecalciferol (VITAMIN D3) 25 MCG (1000 UNIT) tablet, Take 3,000 Units by mouth daily., Disp: , Rfl:    clobetasol (TEMOVATE) 0.05 % external solution, Apply 0.62 application topically daily as needed., Disp: , Rfl:    L-Lysine 500 MG CAPS, Take 500 mg by mouth See admin instructions. Take 500 mg daily or as needed, may take 1000-1500 mg instead when experiencing a cold sore, Disp: , Rfl:    metoprolol tartrate (LOPRESSOR) 25 MG tablet, Take 0.5 tablets (12.5 mg total) by mouth 2 (two) times daily., Disp: 90 tablet, Rfl: 2   Misc Natural Products (OSTEO BI-FLEX ADV TRIPLE ST) TABS, Take 1 tablet by mouth daily., Disp: , Rfl:    Multiple Vitamin (MULTIVITAMIN WITH MINERALS) TABS tablet, Take 1 tablet by mouth daily., Disp: , Rfl:    NON FORMULARY, Balance nature, Disp: , Rfl:    NON FORMULARY, Take 3 capsules by mouth daily. 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 Left ventricle cavity  is normal in size. Severe left ventricular hypertrophy. Normal global wall  motion. Indeterminate diastolic filling pattern, normal LAP.  Left atrial cavity is mildly dilated.  Bioprosthetic valve is well-seated, no evidence of dehiscence, no  perivalvular regurgitation (Edwards Sapien 3 THV size 29 mm, placed  07/2019). No evidence of aortic stenosis. No aortic valve regurgitation.  Mild (Grade I) mitral regurgitation.  Moderate tricuspid regurgitation. No evidence of pulmonary hypertension.  Moderate pulmonic regurgitation.  Compared to study dated 08/17/2019 Mild TR and PR are now moderate  otherwise no significant change.   Carotid artery duplex  09/30/2019:  Stenosis in the right internal carotid artery (50-69%), upper end of  spectrum. Stenosis in the right external carotid artery (<50%).  Stenosis in the left internal carotid artery (50-69%), lower end of  spectrum.  Antegrade right vertebral artery flow. Antegrade left vertebral artery  flow.  Follow up in six months is appropriate if clinically indicated. No  significant change from 03/30/2019, minimal progression in the left carotid  Disease.  TAVR note 07/05/2019: Transfemoral TAVR Edwards Sapien 3 THV (size 29 mm)  RHC/Cor 06/21/2019: LM: Normal LAD: Medial calcification  seen in prox LAD without significant stenosis         Mid LAD focal 40% stenosis LCX: Large, dominant.         Medial calcification seen in prox LCX without significant stenosis RCA: Small, nondominant. Normal.   Normal right heart cath  Recent labs: 11/13/2021: Glucose 102, BUN/Cr 12/1.1. EGFR 91. K 4.1.  05/2021: Chol 130, TG 86, HDL 34, LDL 79   Review of Systems  Cardiovascular:  Negative for chest pain, dyspnea on exertion, leg  swelling, palpitations and syncope.       Objective:   Vitals:   12/24/21 1408  BP: 136/79  Pulse: 66  Temp: 97.8 F (36.6 C)  SpO2: 98%      Physical Exam Vitals and nursing note reviewed.  Constitutional:      General: He is not in acute distress. Neck:     Vascular: No JVD.  Cardiovascular:     Rate and Rhythm: Normal rate and regular rhythm.     Heart sounds: Normal heart sounds. No murmur heard. Pulmonary:     Effort: Pulmonary effort is normal.     Breath sounds: Normal breath sounds. No wheezing or rales.  Musculoskeletal:     Right lower leg: No edema.     Left lower leg: No edema.         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  Aortic stenosis: Now resolved s/p TAVR (07/2019). No dyspnea now. He has NYHA I symptoms. Valve functioning well. No paravalvular leak. (07/2020) Continue Aspirin  Hypertension: Uncontrolled. Changed metoprolol tartrate 12.5 mg bid to losartan 25 mg daily. Check BMP in 1 week.  Encouraged him to check it twice daily. If SBP consistently remains >140 mmHg, could increase losartan to 50 mg daily.  Improved with dietary change. No further change made to medical therapy today.   MR, PR: Moderate, clinically asymptomatic  Carotid artery stenosis: Bilateral, asymptomatic.  Continue aspirin, statin.    Mixed hyperlipidemia: Continue Crestor 40 mg.    F/u in 2 weeks with Ernst Spell, NP Keep f/u w/me after that as scheduled in 01/2022   [name], MD Pager: [phone] Office: [phone]

## 2022-01-07 ENCOUNTER — Ambulatory Visit: Payer: Federal, State, Local not specified - PPO

## 2022-01-07 NOTE — Progress Notes (Deleted)
Patient is here for follow up visit.  Subjective:   Patient ID: [name] [name], male    DOB: 01-12-1932, 88 y.o.   MRN: [id number]   No chief complaint on file.   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  Patient has had high BP upto 180s/80s, improved to 140s/80s. He has had occasional lightheadedness but no other symptoms.   Current Outpatient Medications:    amoxicillin (AMOXIL) 500 MG tablet, Take 4 capsules (2,000 mg) one hour prior to dental visits., Disp: 8 tablet, Rfl: 12   aspirin EC 81 MG tablet, Take 1 tablet (81 mg total) by mouth daily., Disp: 30 tablet, Rfl: 2   b complex vitamins tablet, Take 1 tablet by mouth daily., Disp: , Rfl:    Biotin 10000 MCG TABS, Take 10,000 mcg by mouth daily., Disp: , Rfl:    cholecalciferol (VITAMIN D3) 25 MCG (1000 UNIT) tablet, Take 3,000 Units by mouth daily., Disp: , Rfl:    clobetasol (TEMOVATE) 0.05 % external solution, Apply 1.82 application topically daily as needed., Disp: , Rfl:    L-Lysine 500 MG CAPS, Take 500 mg by mouth See admin instructions. Take 500 mg daily or as needed, may take 1000-1500 mg instead when experiencing a cold sore, Disp: , Rfl:    losartan (COZAAR) 25 MG tablet, Take 1 tablet (25 mg total) by mouth daily., Disp: 30 tablet, Rfl: 3   Misc Natural Products (OSTEO BI-FLEX ADV TRIPLE ST) TABS, Take 1 tablet by mouth daily., Disp: , Rfl:    Multiple Vitamin (MULTIVITAMIN WITH MINERALS) TABS tablet, Take 1 tablet by mouth daily., Disp: , Rfl:    NON FORMULARY, Balance nature, Disp: , Rfl:    NON FORMULARY, Take 3 capsules by mouth daily. Balance of nature, Disp: , Rfl:    rosuvastatin (CRESTOR) 40 MG tablet, Take 40 mg by mouth daily., Disp: , Rfl:    vitamin E 180 MG (400 UNITS) capsule, Take 400 Units by mouth daily., Disp: , Rfl:    Cardiovascular studies:  EKG 12/24/2021: Sinus rhythm 64  bpm Old inferior infarct  Echocardiogram 07/25/2020:  Normal LV systolic function with visual EF 60-65%. Left ventricle cavity  is normal in size. Severe left ventricular hypertrophy. Normal global wall  motion. Indeterminate diastolic filling pattern, normal LAP.  Left atrial cavity is mildly dilated.  Bioprosthetic valve is well-seated, no evidence of dehiscence, no  perivalvular regurgitation (Edwards Sapien 3 THV size 29 mm, placed  07/2019). No evidence of aortic stenosis. No aortic valve regurgitation.  Mild (Grade I) mitral regurgitation.  Moderate tricuspid regurgitation. No evidence of pulmonary hypertension.  Moderate pulmonic regurgitation.  Compared to study dated 08/17/2019 Mild TR and PR are now moderate  otherwise no significant change.   Carotid artery duplex  09/30/2019:  Stenosis in the right internal carotid artery (50-69%), upper end of  spectrum. Stenosis in the right external carotid artery (<50%).  Stenosis in the left internal carotid artery (50-69%), lower end of  spectrum.  Antegrade right vertebral artery flow. Antegrade left vertebral artery  flow.  Follow up in six months is appropriate if clinically indicated. No  significant change from 03/30/2019, minimal progression in the left carotid  Disease.  TAVR note 07/05/2019: Transfemoral TAVR Edwards Sapien 3 THV (size 29 mm)  RHC/Cor 06/21/2019: LM: Normal LAD: Medial calcification seen in prox LAD without significant stenosis  Mid LAD focal 40% stenosis LCX: Large, dominant.         Medial calcification seen in prox LCX without significant stenosis RCA: Small, nondominant. Normal.   Normal right heart cath  Recent labs: 11/13/2021: Glucose 102, BUN/Cr 12/1.1. EGFR 91. K 4.1.  05/2021: Chol 130, TG 86, HDL 34, LDL 79   Review of Systems  Cardiovascular:  Negative for chest pain, dyspnea on exertion, leg swelling, palpitations and syncope.       Objective:   There were no vitals filed  for this visit.   Physical Exam Vitals and nursing note reviewed.  Constitutional:      General: He is not in acute distress. Neck:     Vascular: No JVD.  Cardiovascular:     Rate and Rhythm: Normal rate and regular rhythm.     Heart sounds: Normal heart sounds. No murmur heard. Pulmonary:     Effort: Pulmonary effort is normal.     Breath sounds: Normal breath sounds. No wheezing or rales.  Musculoskeletal:     Right lower leg: No edema.     Left lower leg: No edema.    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  Aortic stenosis: Now resolved s/p TAVR (07/2019). No dyspnea now. He has NYHA I symptoms. Valve functioning well. No paravalvular leak. (07/2020) Continue Aspirin  Hypertension: Uncontrolled. Changed metoprolol tartrate 12.5 mg bid to losartan 25 mg daily. Check BMP in 1 week.  Encouraged him to check it twice daily. If SBP consistently remains >140 mmHg, could increase losartan to 50 mg daily.  Improved with dietary change. No further change made to medical therapy today.   MR, PR: Moderate, clinically asymptomatic  Carotid artery stenosis: Bilateral, asymptomatic.  Continue aspirin, statin.    Mixed hyperlipidemia: Continue Crestor 40 mg.    F/u in 2 weeks with Ernst Spell, NP Keep f/u w/me after that as scheduled in 01/2022   Ernst Spell, AGNP-C Pager: [phone] Office: [phone]

## 2022-01-09 ENCOUNTER — Ambulatory Visit: Payer: Medicare Other

## 2022-01-09 VITALS — BP 138/73 | HR 75 | Ht 70.0 in | Wt 214.0 lb

## 2022-01-09 DIAGNOSIS — E782 Mixed hyperlipidemia: Secondary | ICD-10-CM | POA: Diagnosis not present

## 2022-01-09 DIAGNOSIS — I10 Essential (primary) hypertension: Secondary | ICD-10-CM

## 2022-01-09 DIAGNOSIS — Z952 Presence of prosthetic heart valve: Secondary | ICD-10-CM

## 2022-01-09 MED ORDER — LOSARTAN POTASSIUM 50 MG PO TABS: 50.0000 mg | ORAL_TABLET | Freq: Every day | ORAL | 3 refills | Status: DC

## 2022-01-09 NOTE — Progress Notes (Signed)
Patient is here for follow up visit.  Subjective:   Patient ID: [name] [name], male    DOB: 08-07-1931, 88 y.o.   MRN: [id number]  Chief Complaint  Patient presents with   Hypertension   Follow-up    1-2 week    Hypertension Pertinent negatives include no chest pain or palpitations.   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  Patient was recently seen in office on 12/24/2021 with Dr. Virgina Jock for elevated blood pressure with occasional episodes of lightheadedness.  At that time metoprolol was discontinued and he was started on losartan 25 mg daily.  Presents today for 2-week follow-up.  He brings in a log of home blood pressure readings that are still elevated with highest systolic blood pressure [id number]. He denies chest pain, shortness of breath, palpitations, leg edema, orthopnea, PND, TIA/syncope. He has started going to the gym twice weekly and is walking 2 miles on the treadmill.    Current Outpatient Medications:    amoxicillin (AMOXIL) 500 MG tablet, Take 4 capsules (2,000 mg) one hour prior to dental visits., Disp: 8 tablet, Rfl: 12   aspirin EC 81 MG tablet, Take 1 tablet (81 mg total) by mouth daily., Disp: 30 tablet, Rfl: 2   b complex vitamins tablet, Take 1 tablet by mouth daily., Disp: , Rfl:    Biotin 10000 MCG TABS, Take 10,000 mcg by mouth daily., Disp: , Rfl:    cholecalciferol (VITAMIN D3) 25 MCG (1000 UNIT) tablet, Take 3,000 Units by mouth daily., Disp: , Rfl:    clobetasol (TEMOVATE) 0.05 % external solution, Apply 6.01 application topically daily as needed., Disp: , Rfl:    L-Lysine 500 MG CAPS, Take 500 mg by mouth See admin instructions. Take 500 mg daily or as needed, may take 1000-1500 mg instead when experiencing a cold sore, Disp: , Rfl:    Multiple Vitamin (MULTIVITAMIN WITH MINERALS) TABS tablet, Take 1 tablet by mouth daily., Disp: , Rfl:     NON FORMULARY, Balance nature, Disp: , Rfl:    NON FORMULARY, Take 3 capsules by mouth daily. Balance of nature, Disp: , Rfl:    rosuvastatin (CRESTOR) 40 MG tablet, Take 40 mg by mouth daily., Disp: , Rfl:    vitamin E 180 MG (400 UNITS) capsule, Take 400 Units by mouth daily., Disp: , Rfl:    losartan (COZAAR) 50 MG tablet, Take 1 tablet (50 mg total) by mouth daily., Disp: 30 tablet, Rfl: 3   Cardiovascular studies:  EKG 12/24/2021: Sinus rhythm 64 bpm Old inferior infarct  Echocardiogram 07/25/2020:  Normal LV systolic function with visual EF 60-65%. Left ventricle cavity  is normal in size. Severe left ventricular hypertrophy. Normal global wall  motion. Indeterminate diastolic filling pattern, normal LAP.  Left atrial cavity is mildly dilated.  Bioprosthetic valve is well-seated, no evidence of dehiscence, no  perivalvular regurgitation (Edwards Sapien 3 THV size 29 mm, placed  07/2019). No evidence of aortic stenosis. No aortic valve regurgitation.  Mild (Grade I) mitral regurgitation.  Moderate tricuspid regurgitation. No evidence of pulmonary hypertension.  Moderate pulmonic regurgitation.  Compared to study dated 08/17/2019 Mild TR and PR are now moderate  otherwise no significant change.   Carotid artery duplex  09/30/2019:  Stenosis in the right internal carotid artery (50-69%), upper end of  spectrum. Stenosis in the right external carotid artery (<50%).  Stenosis in the left internal carotid artery (50-69%), lower  end of  spectrum.  Antegrade right vertebral artery flow. Antegrade left vertebral artery  flow.  Follow up in six months is appropriate if clinically indicated. No  significant change from 03/30/2019, minimal progression in the left carotid  Disease.  TAVR note 07/05/2019: Transfemoral TAVR Edwards Sapien 3 THV (size 29 mm)  RHC/Cor 06/21/2019: LM: Normal LAD: Medial calcification seen in prox LAD without significant stenosis         Mid LAD focal 40%  stenosis LCX: Large, dominant.         Medial calcification seen in prox LCX without significant stenosis RCA: Small, nondominant. Normal.   Normal right heart cath  Recent labs: 11/13/2021: Glucose 102, BUN/Cr 12/1.1. EGFR 91. K 4.1.  05/2021: Chol 130, TG 86, HDL 34, LDL 79   Review of Systems  Cardiovascular:  Negative for chest pain, dyspnea on exertion, leg swelling, palpitations and syncope.  Neurological:  Negative for dizziness and light-headedness.    Objective:   Vitals:   01/09/22 1307  BP: 138/73  Pulse: 75  SpO2: 97%      Physical Exam Vitals and nursing note reviewed.  Constitutional:      General: He is not in acute distress. Neck:     Vascular: No JVD.  Cardiovascular:     Rate and Rhythm: Normal rate and regular rhythm.     Heart sounds: Normal heart sounds. No murmur heard. Pulmonary:     Effort: Pulmonary effort is normal.     Breath sounds: Normal breath sounds. No wheezing or rales.  Musculoskeletal:     Right lower leg: No edema.     Left lower leg: No edema.    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  Hypertension Blood pressure remains elevated.  Will increase Losartan to 26m daily. Advised patient to continue to monitor home blood pressure readings at least twice daily and keep a log of readings. He will notify office if blood pressure remains elevated >140/80. [place]  Aortic stenosis S/P TAVR (transcatheter aortic valve replacement) Now resolved s/p TAVR (07/2019). No dyspnea now. He has NYHA I symptoms. Valve functioning well. No paravalvular leak. (07/2020) Continue Aspirin 834mdaily.  Mixed hyperlipidemia Continue Crestor 40 mg.    Follow-up at next scheduled visit on 01/2022 or sooner if needed.   BrErnst SpellAGVirginiaager:  [phone]: [phone]

## 2022-01-09 NOTE — Addendum Note (Signed)
Addended by: April Carlyon on: 01/09/2022 08:57 PM   Modules accepted: Level of Service  

## 2022-01-21 DIAGNOSIS — M9902 Segmental and somatic dysfunction of thoracic region: Secondary | ICD-10-CM | POA: Diagnosis not present

## 2022-01-21 DIAGNOSIS — M9905 Segmental and somatic dysfunction of pelvic region: Secondary | ICD-10-CM | POA: Diagnosis not present

## 2022-01-21 DIAGNOSIS — M5417 Radiculopathy, lumbosacral region: Secondary | ICD-10-CM | POA: Diagnosis not present

## 2022-01-21 DIAGNOSIS — M9903 Segmental and somatic dysfunction of lumbar region: Secondary | ICD-10-CM | POA: Diagnosis not present

## 2022-01-21 DIAGNOSIS — M5136 Other intervertebral disc degeneration, lumbar region: Secondary | ICD-10-CM | POA: Diagnosis not present

## 2022-01-21 DIAGNOSIS — M5415 Radiculopathy, thoracolumbar region: Secondary | ICD-10-CM | POA: Diagnosis not present

## 2022-01-28 ENCOUNTER — Other Ambulatory Visit: Payer: Self-pay | Admitting: Cardiology

## 2022-01-28 ENCOUNTER — Encounter: Payer: Self-pay | Admitting: Cardiology

## 2022-01-28 DIAGNOSIS — I10 Essential (primary) hypertension: Secondary | ICD-10-CM

## 2022-01-28 MED ORDER — AMLODIPINE BESYLATE 5 MG PO TABS: 5.0000 mg | ORAL_TABLET | Freq: Every day | ORAL | 3 refills | Status: DC

## 2022-01-28 NOTE — Progress Notes (Signed)
Blood pressure remains uncontrolled. Added amlodipine 5 mg daily. Patient will check BMP before next visit.

## 2022-01-29 DIAGNOSIS — I10 Essential (primary) hypertension: Secondary | ICD-10-CM | POA: Diagnosis not present

## 2022-01-30 LAB — BASIC METABOLIC PANEL
BUN/Creatinine Ratio: 12 (ref 10–24)
BUN: 13 mg/dL (ref 8–27)
CO2: 23 mmol/L (ref 20–29)
Calcium: 9.6 mg/dL (ref 8.6–10.2)
Chloride: 107 mmol/L — ABNORMAL HIGH (ref 96–106)
Creatinine, Ser: 1.13 mg/dL (ref 0.76–1.27)
Glucose: 104 mg/dL — ABNORMAL HIGH (ref 70–99)
Potassium: 4.5 mmol/L (ref 3.5–5.2)
Sodium: 145 mmol/L — ABNORMAL HIGH (ref 134–144)
eGFR: 63 mL/min/{1.73_m2} (ref >59)

## 2022-01-30 NOTE — Progress Notes (Signed)
Called and spoke with patient regarding his recent lab results.

## 2022-02-10 ENCOUNTER — Encounter: Payer: Self-pay | Admitting: Cardiology

## 2022-02-10 ENCOUNTER — Ambulatory Visit: Payer: Medicare Other | Admitting: Cardiology

## 2022-02-10 VITALS — BP 152/76 | HR 70 | Ht 70.0 in | Wt 209.4 lb

## 2022-02-10 DIAGNOSIS — I10 Essential (primary) hypertension: Secondary | ICD-10-CM | POA: Diagnosis not present

## 2022-02-10 DIAGNOSIS — E782 Mixed hyperlipidemia: Secondary | ICD-10-CM

## 2022-02-10 MED ORDER — AMLODIPINE BESYLATE 10 MG PO TABS: 10.0000 mg | ORAL_TABLET | Freq: Every day | ORAL | 3 refills | Status: DC

## 2022-02-10 NOTE — Progress Notes (Signed)
Patient is here for follow up visit.  Subjective:   _0  ID: [name] [name], male    DOB: 1931-11-20, 88 y.o.   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  BP numbers have been trending down but stay elevated. Reviewed recent test results with the patient, details below. He is staying in good spirits. He turns 89 tomorrow. He is exercising regularly without any symptoms.         Current Outpatient Medications:    amLODipine (NORVASC) 5 MG tablet, Take 1 tablet (5 mg total) by mouth daily., Disp: 30 tablet, Rfl: 3   amoxicillin (AMOXIL) 500 MG tablet, Take 4 capsules (2,000 mg) one hour prior to dental visits., Disp: 8 tablet, Rfl: 12   aspirin EC 81 MG tablet, Take 1 tablet (81 mg total) by mouth daily., Disp: 30 tablet, Rfl: 2   b complex vitamins tablet, Take 1 tablet by mouth daily., Disp: , Rfl:    Biotin 10000 MCG TABS, Take 10,000 mcg by mouth daily., Disp: , Rfl:    cholecalciferol (VITAMIN D3) 25 MCG (1000 UNIT) tablet, Take 3,000 Units by mouth daily., Disp: , Rfl:    clobetasol (TEMOVATE) 0.05 % external solution, Apply 4.27 application topically daily as needed., Disp: , Rfl:    L-Lysine 500 MG CAPS, Take 500 mg by mouth See admin instructions. Take 500 mg daily or as needed, may take 1000-1500 mg instead when experiencing a cold sore, Disp: , Rfl:    losartan (COZAAR) 50 MG tablet, Take 1 tablet (50 mg total) by mouth daily., Disp: 30 tablet, Rfl: 3   Multiple Vitamin (MULTIVITAMIN WITH MINERALS) TABS tablet, Take 1 tablet by mouth daily., Disp: , Rfl:    NON FORMULARY, Balance nature, Disp: , Rfl:    NON FORMULARY, Take 3 capsules by mouth daily. Balance of nature, Disp: , Rfl:    rosuvastatin (CRESTOR) 40 MG tablet, Take 40 mg by mouth daily., Disp: , Rfl:     vitamin E 180 MG (400 UNITS) capsule, Take 400 Units by mouth daily., Disp: , Rfl:    Cardiovascular studies:  EKG 12/24/2021: Sinus rhythm 64 bpm Old inferior infarct  Echocardiogram 07/25/2020:  Normal LV systolic function with visual EF 60-65%. Left ventricle cavity  is normal in size. Severe left ventricular hypertrophy. Normal global wall  motion. Indeterminate diastolic filling pattern, normal LAP.  Left atrial cavity is mildly dilated.  Bioprosthetic valve is well-seated, no evidence of dehiscence, no  perivalvular regurgitation (Edwards Sapien 3 THV size 29 mm, placed  07/2019). No evidence of aortic stenosis. No aortic valve regurgitation.  Mild (Grade I) mitral regurgitation.  Moderate tricuspid regurgitation. No evidence of pulmonary hypertension.  Moderate pulmonic regurgitation.  Compared to study dated 08/17/2019 Mild TR and PR are now moderate  otherwise no significant change.   Carotid artery duplex  09/30/2019:  Stenosis in the right internal carotid artery (50-69%), upper end of  spectrum. Stenosis in the right external carotid artery (<50%).  Stenosis in the left internal carotid artery (50-69%), lower end of  spectrum.  Antegrade right vertebral artery flow. Antegrade left vertebral artery  flow.  Follow up in six months is appropriate if clinically indicated. No  significant change from 03/30/2019, minimal progression in the left carotid  Disease.  TAVR note 07/05/2019:  Transfemoral TAVR Edwards Sapien 3 THV (size 29 mm)  RHC/Cor 06/21/2019: LM: Normal LAD: Medial calcification seen in prox LAD without significant stenosis         Mid LAD focal 40% stenosis LCX: Large, dominant.         Medial calcification seen in prox LCX without significant stenosis RCA: Small, nondominant. Normal.   Normal right heart cath  Recent labs: 01/29/2022: Glucose 104, BUN/Cr 13/1.13. EGFR 63. Na/K 145/4.5.   05/2021: Chol 130, TG 86, HDL 34, LDL 79   Review of Systems   Cardiovascular:  Negative for chest pain, dyspnea on exertion, leg swelling, palpitations and syncope.       Objective:   Vitals:   02/10/22 1454 02/10/22 1459  BP: (!) 155/76 (!) 152/76  Pulse: 72 70  SpO2: 97% 96%      Physical Exam Vitals and nursing note reviewed.  Constitutional:      General: He is not in acute distress. Neck:     Vascular: No JVD.  Cardiovascular:     Rate and Rhythm: Normal rate and regular rhythm.     Heart sounds: Normal heart sounds. No murmur heard. Pulmonary:     Effort: Pulmonary effort is normal.     Breath sounds: Normal breath sounds. No wheezing or rales.  Musculoskeletal:     Right lower leg: No edema.     Left lower leg: No edema.         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  Aortic stenosis: Now resolved s/p TAVR (07/2019). No dyspnea now. He has NYHA I symptoms. Valve functioning well. No paravalvular leak. (07/2020) Continue Aspirin  Hypertension: Uncontrolled, but improving. Increase amlodipine to 10 mg daily.  Continue losartan 50 mg daily. Encouraged hydration.  MR, PR: Moderate, clinically asymptomatic  Carotid artery stenosis: Bilateral, asymptomatic.  Continue aspirin, statin.    Mixed hyperlipidemia: Continue Crestor 40 mg.    F/u in 6 weeks    [name] Mormon, MD Pager: [phone] Office: [phone]

## 2022-03-13 DIAGNOSIS — M9903 Segmental and somatic dysfunction of lumbar region: Secondary | ICD-10-CM | POA: Diagnosis not present

## 2022-03-13 DIAGNOSIS — M5415 Radiculopathy, thoracolumbar region: Secondary | ICD-10-CM | POA: Diagnosis not present

## 2022-03-13 DIAGNOSIS — M9905 Segmental and somatic dysfunction of pelvic region: Secondary | ICD-10-CM | POA: Diagnosis not present

## 2022-03-13 DIAGNOSIS — M5136 Other intervertebral disc degeneration, lumbar region: Secondary | ICD-10-CM | POA: Diagnosis not present

## 2022-03-13 DIAGNOSIS — M5417 Radiculopathy, lumbosacral region: Secondary | ICD-10-CM | POA: Diagnosis not present

## 2022-03-13 DIAGNOSIS — M9902 Segmental and somatic dysfunction of thoracic region: Secondary | ICD-10-CM | POA: Diagnosis not present

## 2022-03-20 ENCOUNTER — Ambulatory Visit: Payer: Federal, State, Local not specified - PPO | Admitting: Cardiology

## 2022-03-21 ENCOUNTER — Other Ambulatory Visit: Payer: Self-pay | Admitting: Cardiology

## 2022-03-21 DIAGNOSIS — I10 Essential (primary) hypertension: Secondary | ICD-10-CM

## 2022-03-27 ENCOUNTER — Encounter: Payer: Self-pay | Admitting: Cardiology

## 2022-03-27 ENCOUNTER — Ambulatory Visit: Payer: Medicare Other | Admitting: Cardiology

## 2022-03-27 VITALS — BP 147/73 | HR 63 | Resp 16 | Ht 70.0 in | Wt 210.0 lb

## 2022-03-27 DIAGNOSIS — I10 Essential (primary) hypertension: Secondary | ICD-10-CM

## 2022-03-27 NOTE — Progress Notes (Signed)
Patient is here for follow up visit.  Subjective:   '@Patient'$  ID: [name] [name], male    DOB: 1931/08/31, 89 y.o.   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  While blood pressure is elevated here, home readings are mostly in 120s-130s/70s.     Current Outpatient Medications:    amLODipine (NORVASC) 10 MG tablet, Take 1 tablet (10 mg total) by mouth daily., Disp: 30 tablet, Rfl: 3   aspirin EC 81 MG tablet, Take 1 tablet (81 mg total) by mouth daily., Disp: 30 tablet, Rfl: 2   clobetasol (TEMOVATE) 0.05 % external solution, Apply 5.63 application topically daily as needed., Disp: , Rfl:    losartan (COZAAR) 25 MG tablet, TAKE 1 TABLET (25 MG TOTAL) BY MOUTH DAILY. (Patient taking differently: Take 50 mg by mouth daily.), Disp: 90 tablet, Rfl: 1   losartan (COZAAR) 50 MG tablet, Take 1 tablet (50 mg total) by mouth daily., Disp: 30 tablet, Rfl: 3   Multiple Vitamin (MULTIVITAMIN WITH MINERALS) TABS tablet, Take 1 tablet by mouth daily., Disp: , Rfl:    NON FORMULARY, Balance nature, Disp: , Rfl:    NON FORMULARY, Take 3 capsules by mouth daily. Balance of nature, Disp: , Rfl:    rosuvastatin (CRESTOR) 40 MG tablet, Take 40 mg by mouth daily., Disp: , Rfl:    amoxicillin (AMOXIL) 500 MG tablet, Take 4 capsules (2,000 mg) one hour prior to dental visits. (Patient not taking: Reported on 03/27/2022), Disp: 8 tablet, Rfl: 12   L-Lysine 500 MG CAPS, Take 500 mg by mouth See admin instructions. Take 500 mg daily or as needed, may take 1000-1500 mg instead when experiencing a cold sore (Patient not taking: Reported on 03/27/2022), Disp: , Rfl:    Cardiovascular studies:  EKG 12/24/2021: Sinus rhythm 64 bpm Old inferior infarct  Echocardiogram 07/25/2020:  Normal LV systolic function  with visual EF 60-65%. Left ventricle cavity  is normal in size. Severe left ventricular hypertrophy. Normal global wall  motion. Indeterminate diastolic filling pattern, normal LAP.  Left atrial cavity is mildly dilated.  Bioprosthetic valve is well-seated, no evidence of dehiscence, no  perivalvular regurgitation (Edwards Sapien 3 THV size 29 mm, placed  07/2019). No evidence of aortic stenosis. No aortic valve regurgitation.  Mild (Grade I) mitral regurgitation.  Moderate tricuspid regurgitation. No evidence of pulmonary hypertension.  Moderate pulmonic regurgitation.  Compared to study dated 08/17/2019 Mild TR and PR are now moderate  otherwise no significant change.   Carotid artery duplex  09/30/2019:  Stenosis in the right internal carotid artery (50-69%), upper end of  spectrum. Stenosis in the right external carotid artery (<50%).  Stenosis in the left internal carotid artery (50-69%), lower end of  spectrum.  Antegrade right vertebral artery flow. Antegrade left vertebral artery  flow.  Follow up in six months is appropriate if clinically indicated. No  significant change from 03/30/2019, minimal progression in the left carotid  Disease.  TAVR note 07/05/2019: Transfemoral TAVR Edwards Sapien 3 THV (size 29 mm)  RHC/Cor 06/21/2019: LM: Normal LAD: Medial calcification seen in prox LAD without significant stenosis         Mid LAD focal 40% stenosis LCX: Large, dominant.         Medial calcification seen in prox LCX without significant  stenosis RCA: Small, nondominant. Normal.   Normal right heart cath  Recent labs: 01/29/2022: Glucose 104, BUN/Cr 13/1.13. EGFR 63. Na/K 145/4.5.   05/2021: Chol 130, TG 86, HDL 34, LDL 79   Review of Systems  Cardiovascular:  Negative for chest pain, dyspnea on exertion, leg swelling, palpitations and syncope.       Objective:   Vitals:   03/27/22 1401  BP: (!) 147/73  Pulse: 63  Resp: 16  SpO2: 95%      Physical  Exam Vitals and nursing note reviewed.  Constitutional:      General: He is not in acute distress. Neck:     Vascular: No JVD.  Cardiovascular:     Rate and Rhythm: Normal rate and regular rhythm.     Heart sounds: Normal heart sounds. No murmur heard. Pulmonary:     Effort: Pulmonary effort is normal.     Breath sounds: Normal breath sounds. No wheezing or rales.  Musculoskeletal:     Right lower leg: No edema.     Left lower leg: No edema.         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  Aortic stenosis: Now resolved s/p TAVR (07/2019). No dyspnea now. He has NYHA I symptoms. Valve functioning well. No paravalvular leak. (07/2020) Continue Aspirin  Hypertension: Well controlled at home. Continue amlodipine to 10 mg daily, losartan 50 mg daily. Encouraged hydration.  MR, PR: Moderate, clinically asymptomatic  Carotid artery stenosis: Bilateral, asymptomatic.  Continue aspirin, statin.    Mixed hyperlipidemia: Continue Crestor 40 mg.    F/u in 6 months    [name], MD Pager: [phone] Office: [phone]

## 2022-04-08 DIAGNOSIS — Z08 Encounter for follow-up examination after completed treatment for malignant neoplasm: Secondary | ICD-10-CM | POA: Diagnosis not present

## 2022-04-08 DIAGNOSIS — L570 Actinic keratosis: Secondary | ICD-10-CM | POA: Diagnosis not present

## 2022-04-08 DIAGNOSIS — X32XXXD Exposure to sunlight, subsequent encounter: Secondary | ICD-10-CM | POA: Diagnosis not present

## 2022-04-08 DIAGNOSIS — D225 Melanocytic nevi of trunk: Secondary | ICD-10-CM | POA: Diagnosis not present

## 2022-04-08 DIAGNOSIS — C44722 Squamous cell carcinoma of skin of right lower limb, including hip: Secondary | ICD-10-CM | POA: Diagnosis not present

## 2022-04-08 DIAGNOSIS — Z85820 Personal history of malignant melanoma of skin: Secondary | ICD-10-CM | POA: Diagnosis not present

## 2022-04-08 DIAGNOSIS — Z1283 Encounter for screening for malignant neoplasm of skin: Secondary | ICD-10-CM | POA: Diagnosis not present

## 2022-04-12 ENCOUNTER — Other Ambulatory Visit: Payer: Self-pay

## 2022-04-12 DIAGNOSIS — I10 Essential (primary) hypertension: Secondary | ICD-10-CM

## 2022-04-17 DIAGNOSIS — M5417 Radiculopathy, lumbosacral region: Secondary | ICD-10-CM | POA: Diagnosis not present

## 2022-04-17 DIAGNOSIS — M9902 Segmental and somatic dysfunction of thoracic region: Secondary | ICD-10-CM | POA: Diagnosis not present

## 2022-04-17 DIAGNOSIS — M5415 Radiculopathy, thoracolumbar region: Secondary | ICD-10-CM | POA: Diagnosis not present

## 2022-04-17 DIAGNOSIS — M9905 Segmental and somatic dysfunction of pelvic region: Secondary | ICD-10-CM | POA: Diagnosis not present

## 2022-04-17 DIAGNOSIS — M5136 Other intervertebral disc degeneration, lumbar region: Secondary | ICD-10-CM | POA: Diagnosis not present

## 2022-04-17 DIAGNOSIS — M9903 Segmental and somatic dysfunction of lumbar region: Secondary | ICD-10-CM | POA: Diagnosis not present

## 2022-04-19 ENCOUNTER — Other Ambulatory Visit: Payer: Self-pay | Admitting: Cardiology

## 2022-04-19 DIAGNOSIS — I10 Essential (primary) hypertension: Secondary | ICD-10-CM

## 2022-04-28 ENCOUNTER — Other Ambulatory Visit: Payer: Self-pay

## 2022-04-28 MED ORDER — AMOXICILLIN 500 MG PO TABS: ORAL_TABLET | ORAL | 12 refills | Status: DC

## 2022-04-29 ENCOUNTER — Other Ambulatory Visit: Payer: Self-pay

## 2022-04-29 MED ORDER — AMOXICILLIN 500 MG PO TABS: ORAL_TABLET | ORAL | 12 refills | Status: DC

## 2022-05-12 DIAGNOSIS — E7800 Pure hypercholesterolemia, unspecified: Secondary | ICD-10-CM | POA: Diagnosis not present

## 2022-05-12 DIAGNOSIS — Z125 Encounter for screening for malignant neoplasm of prostate: Secondary | ICD-10-CM | POA: Diagnosis not present

## 2022-05-12 DIAGNOSIS — R7989 Other specified abnormal findings of blood chemistry: Secondary | ICD-10-CM | POA: Diagnosis not present

## 2022-05-12 DIAGNOSIS — I10 Essential (primary) hypertension: Secondary | ICD-10-CM | POA: Diagnosis not present

## 2022-05-13 DIAGNOSIS — R82998 Other abnormal findings in urine: Secondary | ICD-10-CM | POA: Diagnosis not present

## 2022-05-14 DIAGNOSIS — E7800 Pure hypercholesterolemia, unspecified: Secondary | ICD-10-CM | POA: Diagnosis not present

## 2022-05-14 DIAGNOSIS — Z952 Presence of prosthetic heart valve: Secondary | ICD-10-CM | POA: Diagnosis not present

## 2022-05-14 DIAGNOSIS — Z0000 Encounter for general adult medical examination without abnormal findings: Secondary | ICD-10-CM | POA: Diagnosis not present

## 2022-05-14 DIAGNOSIS — F5221 Male erectile disorder: Secondary | ICD-10-CM | POA: Diagnosis not present

## 2022-05-14 DIAGNOSIS — N3281 Overactive bladder: Secondary | ICD-10-CM | POA: Diagnosis not present

## 2022-05-14 DIAGNOSIS — Z1339 Encounter for screening examination for other mental health and behavioral disorders: Secondary | ICD-10-CM | POA: Diagnosis not present

## 2022-05-14 DIAGNOSIS — I119 Hypertensive heart disease without heart failure: Secondary | ICD-10-CM | POA: Diagnosis not present

## 2022-05-14 DIAGNOSIS — I350 Nonrheumatic aortic (valve) stenosis: Secondary | ICD-10-CM | POA: Diagnosis not present

## 2022-05-14 DIAGNOSIS — E669 Obesity, unspecified: Secondary | ICD-10-CM | POA: Diagnosis not present

## 2022-05-14 DIAGNOSIS — I517 Cardiomegaly: Secondary | ICD-10-CM | POA: Diagnosis not present

## 2022-05-14 DIAGNOSIS — Z1331 Encounter for screening for depression: Secondary | ICD-10-CM | POA: Diagnosis not present

## 2022-05-14 DIAGNOSIS — I6523 Occlusion and stenosis of bilateral carotid arteries: Secondary | ICD-10-CM | POA: Diagnosis not present

## 2022-05-22 DIAGNOSIS — M5136 Other intervertebral disc degeneration, lumbar region: Secondary | ICD-10-CM | POA: Diagnosis not present

## 2022-05-22 DIAGNOSIS — M9905 Segmental and somatic dysfunction of pelvic region: Secondary | ICD-10-CM | POA: Diagnosis not present

## 2022-05-22 DIAGNOSIS — M9903 Segmental and somatic dysfunction of lumbar region: Secondary | ICD-10-CM | POA: Diagnosis not present

## 2022-05-22 DIAGNOSIS — M9902 Segmental and somatic dysfunction of thoracic region: Secondary | ICD-10-CM | POA: Diagnosis not present

## 2022-05-22 DIAGNOSIS — M5417 Radiculopathy, lumbosacral region: Secondary | ICD-10-CM | POA: Diagnosis not present

## 2022-05-22 DIAGNOSIS — M5415 Radiculopathy, thoracolumbar region: Secondary | ICD-10-CM | POA: Diagnosis not present

## 2022-06-03 DIAGNOSIS — H43813 Vitreous degeneration, bilateral: Secondary | ICD-10-CM | POA: Diagnosis not present

## 2022-06-03 DIAGNOSIS — H31101 Choroidal degeneration, unspecified, right eye: Secondary | ICD-10-CM | POA: Diagnosis not present

## 2022-06-03 DIAGNOSIS — H532 Diplopia: Secondary | ICD-10-CM | POA: Diagnosis not present

## 2022-06-03 DIAGNOSIS — Z961 Presence of intraocular lens: Secondary | ICD-10-CM | POA: Diagnosis not present

## 2022-06-17 DIAGNOSIS — X32XXXD Exposure to sunlight, subsequent encounter: Secondary | ICD-10-CM | POA: Diagnosis not present

## 2022-06-17 DIAGNOSIS — L570 Actinic keratosis: Secondary | ICD-10-CM | POA: Diagnosis not present

## 2022-06-17 DIAGNOSIS — Z08 Encounter for follow-up examination after completed treatment for malignant neoplasm: Secondary | ICD-10-CM | POA: Diagnosis not present

## 2022-06-17 DIAGNOSIS — C44729 Squamous cell carcinoma of skin of left lower limb, including hip: Secondary | ICD-10-CM | POA: Diagnosis not present

## 2022-06-17 DIAGNOSIS — Z85828 Personal history of other malignant neoplasm of skin: Secondary | ICD-10-CM | POA: Diagnosis not present

## 2022-06-23 ENCOUNTER — Other Ambulatory Visit: Payer: Self-pay | Admitting: Cardiology

## 2022-06-24 ENCOUNTER — Telehealth: Payer: Self-pay

## 2022-06-24 ENCOUNTER — Other Ambulatory Visit: Payer: Self-pay

## 2022-06-24 NOTE — Telephone Encounter (Signed)
He doe snot need to take it. I am not sure how this prescription went out. It seems to be an error. Please discontinue it.  Thanks MJP

## 2022-06-24 NOTE — Telephone Encounter (Signed)
Called patient and is aware.

## 2022-06-24 NOTE — Telephone Encounter (Signed)
Patient called answering should he be taking Metoprolol 25 mg due that he has not been taking it. Please advise

## 2022-06-26 DIAGNOSIS — M9905 Segmental and somatic dysfunction of pelvic region: Secondary | ICD-10-CM | POA: Diagnosis not present

## 2022-06-26 DIAGNOSIS — M5136 Other intervertebral disc degeneration, lumbar region: Secondary | ICD-10-CM | POA: Diagnosis not present

## 2022-06-26 DIAGNOSIS — M5415 Radiculopathy, thoracolumbar region: Secondary | ICD-10-CM | POA: Diagnosis not present

## 2022-06-26 DIAGNOSIS — M5417 Radiculopathy, lumbosacral region: Secondary | ICD-10-CM | POA: Diagnosis not present

## 2022-06-26 DIAGNOSIS — M9902 Segmental and somatic dysfunction of thoracic region: Secondary | ICD-10-CM | POA: Diagnosis not present

## 2022-06-26 DIAGNOSIS — M9903 Segmental and somatic dysfunction of lumbar region: Secondary | ICD-10-CM | POA: Diagnosis not present

## 2022-07-31 DIAGNOSIS — M5415 Radiculopathy, thoracolumbar region: Secondary | ICD-10-CM | POA: Diagnosis not present

## 2022-07-31 DIAGNOSIS — M9905 Segmental and somatic dysfunction of pelvic region: Secondary | ICD-10-CM | POA: Diagnosis not present

## 2022-07-31 DIAGNOSIS — M9903 Segmental and somatic dysfunction of lumbar region: Secondary | ICD-10-CM | POA: Diagnosis not present

## 2022-07-31 DIAGNOSIS — M5136 Other intervertebral disc degeneration, lumbar region: Secondary | ICD-10-CM | POA: Diagnosis not present

## 2022-07-31 DIAGNOSIS — M9902 Segmental and somatic dysfunction of thoracic region: Secondary | ICD-10-CM | POA: Diagnosis not present

## 2022-07-31 DIAGNOSIS — M5417 Radiculopathy, lumbosacral region: Secondary | ICD-10-CM | POA: Diagnosis not present

## 2022-08-28 DIAGNOSIS — M9905 Segmental and somatic dysfunction of pelvic region: Secondary | ICD-10-CM | POA: Diagnosis not present

## 2022-08-28 DIAGNOSIS — M5417 Radiculopathy, lumbosacral region: Secondary | ICD-10-CM | POA: Diagnosis not present

## 2022-08-28 DIAGNOSIS — M9903 Segmental and somatic dysfunction of lumbar region: Secondary | ICD-10-CM | POA: Diagnosis not present

## 2022-08-28 DIAGNOSIS — M5136 Other intervertebral disc degeneration, lumbar region: Secondary | ICD-10-CM | POA: Diagnosis not present

## 2022-08-28 DIAGNOSIS — M9902 Segmental and somatic dysfunction of thoracic region: Secondary | ICD-10-CM | POA: Diagnosis not present

## 2022-08-28 DIAGNOSIS — M5415 Radiculopathy, thoracolumbar region: Secondary | ICD-10-CM | POA: Diagnosis not present

## 2022-09-19 ENCOUNTER — Other Ambulatory Visit: Payer: Self-pay | Admitting: Cardiology

## 2022-09-19 DIAGNOSIS — I10 Essential (primary) hypertension: Secondary | ICD-10-CM

## 2022-09-25 ENCOUNTER — Ambulatory Visit: Payer: Medicare Other | Admitting: Cardiology

## 2022-09-25 ENCOUNTER — Encounter: Payer: Self-pay | Admitting: Cardiology

## 2022-09-25 VITALS — BP 130/65 | HR 77 | Resp 16 | Ht 70.0 in | Wt 207.8 lb

## 2022-09-25 DIAGNOSIS — I10 Essential (primary) hypertension: Secondary | ICD-10-CM

## 2022-09-25 DIAGNOSIS — E782 Mixed hyperlipidemia: Secondary | ICD-10-CM

## 2022-09-25 MED ORDER — AMLODIPINE BESYLATE 10 MG PO TABS
10.0000 mg | ORAL_TABLET | Freq: Every day | ORAL | 3 refills | Status: DC
Start: 2022-09-25 — End: 2023-10-23

## 2022-09-25 MED ORDER — LOSARTAN POTASSIUM 50 MG PO TABS: 50.0000 mg | ORAL_TABLET | Freq: Every day | ORAL | 3 refills | Status: DC

## 2022-09-25 MED ORDER — ASPIRIN 81 MG PO TBEC: 81.0000 mg | DELAYED_RELEASE_TABLET | Freq: Every day | ORAL | 3 refills | Status: DC

## 2022-09-25 NOTE — Progress Notes (Signed)
Patient is here for follow up visit.  Subjective:   @Patient  ID: [name] [name], male    DOB: 10/10/1931, 89 y.o.   MRN: [id number]   Chief Complaint  Patient presents with   Essential hypertension    HPI  89 year old Caucasian male with hypertension, s/p TAVR (29 mm Ramon Dredge Sapien-07/2019) for severe AS, bilateral asymptomatic mild-mod carotid stenosis.  Patient is doing well, denies chest pain, shortness of breath, palpitations, leg edema, orthopnea, PND, TIA/syncope. He took himself off statin a few weeks due to myalgias and has been feeling much better since. Blood pressure is well controlled.     Current Outpatient Medications:    amLODipine (NORVASC) 10 MG tablet, TAKE 1 TABLET BY MOUTH EVERY DAY, Disp: 90 tablet, Rfl: 1   amoxicillin (AMOXIL) 500 MG tablet, Take 4 capsules (2,000 mg) one hour prior to dental visits., Disp: 8 tablet, Rfl: 12   aspirin EC 81 MG tablet, Take 1 tablet (81 mg total) by mouth daily., Disp: 30 tablet, Rfl: 2   clobetasol (TEMOVATE) 0.05 % external solution, Apply 0.05 application topically daily as needed., Disp: , Rfl:    L-Lysine 500 MG CAPS, Take 500 mg by mouth See admin instructions. Take 500 mg daily or as needed, may take 1000-1500 mg instead when experiencing a cold sore (Patient not taking: Reported on 03/27/2022), Disp: , Rfl:    losartan (COZAAR) 25 MG tablet, Take 2 tablets (50 mg total) by mouth daily., Disp: 90 tablet, Rfl: 1   losartan (COZAAR) 50 MG tablet, TAKE 1 TABLET BY MOUTH EVERY DAY, Disp: 90 tablet, Rfl: 1   Multiple Vitamin (MULTIVITAMIN WITH MINERALS) TABS tablet, Take 1 tablet by mouth daily., Disp: , Rfl:    NON FORMULARY, Balance nature, Disp: , Rfl:    NON FORMULARY, Take 3 capsules by mouth daily. Balance of nature, Disp: , Rfl:    rosuvastatin (CRESTOR) 40 MG tablet, Take 40 mg by mouth daily., Disp: , Rfl:    Cardiovascular studies:  EKG 09/25/2022: Probable sinus rhythm 70 bpm Old inferior  infarct   Echocardiogram 07/25/2020:  Normal LV systolic function with visual EF 60-65%. Left ventricle cavity  is normal in size. Severe left ventricular hypertrophy. Normal global wall  motion. Indeterminate diastolic filling pattern, normal LAP.  Left atrial cavity is mildly dilated.  Bioprosthetic valve is well-seated, no evidence of dehiscence, no  perivalvular regurgitation (Edwards Sapien 3 THV size 29 mm, placed  07/2019). No evidence of aortic stenosis. No aortic valve regurgitation.  Mild (Grade I) mitral regurgitation.  Moderate tricuspid regurgitation. No evidence of pulmonary hypertension.  Moderate pulmonic regurgitation.  Compared to study dated 08/17/2019 Mild TR and PR are now moderate  otherwise no significant change.   Carotid artery duplex  09/30/2019:  Stenosis in the right internal carotid artery (50-69%), upper end of  spectrum. Stenosis in the right external carotid artery (<50%).  Stenosis in the left internal carotid artery (50-69%), lower end of  spectrum.  Antegrade right vertebral artery flow. Antegrade left vertebral artery  flow.  Follow up in six months is appropriate if clinically indicated. No  significant change from 03/30/2019, minimal progression in the left carotid  Disease.  TAVR note 07/05/2019: Transfemoral TAVR Edwards Sapien 3 THV (size 29 mm)  RHC/Cor 06/21/2019: LM: Normal LAD: Medial calcification seen in prox LAD without significant stenosis         Mid LAD focal 40% stenosis LCX: Large, dominant.  Medial calcification seen in prox LCX without significant stenosis RCA: Small, nondominant. Normal.   Normal right heart cath  Recent labs: 01/29/2022: Glucose 104, BUN/Cr 13/1.13. EGFR 63. Na/K 145/4.5.   05/2021: Chol 130, TG 86, HDL 34, LDL 79   Review of Systems  Cardiovascular:  Negative for chest pain, dyspnea on exertion, leg swelling, palpitations and syncope.       Objective:   Vitals:   09/25/22 1316  BP:  130/65  Pulse: 77  Resp: 16  SpO2: 98%       Physical Exam Vitals and nursing note reviewed.  Constitutional:      General: He is not in acute distress. Neck:     Vascular: No JVD.  Cardiovascular:     Rate and Rhythm: Normal rate and regular rhythm.     Heart sounds: Murmur heard.     High-pitched blowing holosystolic murmur is present with a grade of 2/6 at the apex.  Pulmonary:     Effort: Pulmonary effort is normal.     Breath sounds: Normal breath sounds. No wheezing or rales.  Musculoskeletal:     Right lower leg: No edema.     Left lower leg: No edema.         Assessment & Recommendations:   89 year old Caucasian male with hypertension, s/p TAVR (29 mm Ramon Dredge Sapien-07/2019) for severe AS, bilateral asymptomatic mild-mod carotid stenosis.  Aortic stenosis: Now resolved s/p TAVR (07/2019). No dyspnea now. He has NYHA I symptoms. Valve functioning well. No paravalvular leak. (07/2020) Continue Aspirin  Hypertension: Well controlled. Refill losartan 50 mg daily, amlodipine 10 mg daily.   MR, PR: Moderate, clinically asymptomatic  Carotid artery stenosis: Bilateral, asymptomatic.  Continue aspirin, statin.    Mixed hyperlipidemia: Patient took himself off statin due to myalgias.  Will check lipid panel now.  Will discuss further based on the results.  F/u in 1 year     Elder Negus, MD Pager: [phone] Office: [phone]

## 2022-09-26 DIAGNOSIS — E782 Mixed hyperlipidemia: Secondary | ICD-10-CM | POA: Diagnosis not present

## 2022-09-30 NOTE — Progress Notes (Signed)
LDL cholesterol has gone up almost double after stopping statin. Are you willing tro to a different statin, or maybe a nonstatin drug?  Thanks MJP

## 2022-10-01 NOTE — Progress Notes (Signed)
Spoke to pt, Pt states he will like to try a non statin drug

## 2022-10-01 NOTE — Progress Notes (Signed)
Called patient, NA, LMAM

## 2022-10-02 DIAGNOSIS — M9905 Segmental and somatic dysfunction of pelvic region: Secondary | ICD-10-CM | POA: Diagnosis not present

## 2022-10-02 DIAGNOSIS — M9903 Segmental and somatic dysfunction of lumbar region: Secondary | ICD-10-CM | POA: Diagnosis not present

## 2022-10-02 DIAGNOSIS — M9902 Segmental and somatic dysfunction of thoracic region: Secondary | ICD-10-CM | POA: Diagnosis not present

## 2022-10-02 DIAGNOSIS — M5415 Radiculopathy, thoracolumbar region: Secondary | ICD-10-CM | POA: Diagnosis not present

## 2022-10-02 DIAGNOSIS — M5136 Other intervertebral disc degeneration, lumbar region: Secondary | ICD-10-CM | POA: Diagnosis not present

## 2022-10-02 DIAGNOSIS — M5417 Radiculopathy, lumbosacral region: Secondary | ICD-10-CM | POA: Diagnosis not present

## 2022-10-15 NOTE — Telephone Encounter (Signed)
done

## 2022-11-06 DIAGNOSIS — M5136 Other intervertebral disc degeneration, lumbar region: Secondary | ICD-10-CM | POA: Diagnosis not present

## 2022-11-06 DIAGNOSIS — M9902 Segmental and somatic dysfunction of thoracic region: Secondary | ICD-10-CM | POA: Diagnosis not present

## 2022-11-06 DIAGNOSIS — M5417 Radiculopathy, lumbosacral region: Secondary | ICD-10-CM | POA: Diagnosis not present

## 2022-11-06 DIAGNOSIS — M9903 Segmental and somatic dysfunction of lumbar region: Secondary | ICD-10-CM | POA: Diagnosis not present

## 2022-11-06 DIAGNOSIS — M5415 Radiculopathy, thoracolumbar region: Secondary | ICD-10-CM | POA: Diagnosis not present

## 2022-11-06 DIAGNOSIS — M9905 Segmental and somatic dysfunction of pelvic region: Secondary | ICD-10-CM | POA: Diagnosis not present

## 2022-11-12 DIAGNOSIS — F5221 Male erectile disorder: Secondary | ICD-10-CM | POA: Diagnosis not present

## 2022-11-12 DIAGNOSIS — I119 Hypertensive heart disease without heart failure: Secondary | ICD-10-CM | POA: Diagnosis not present

## 2022-11-12 DIAGNOSIS — E7800 Pure hypercholesterolemia, unspecified: Secondary | ICD-10-CM | POA: Diagnosis not present

## 2022-11-12 DIAGNOSIS — Z23 Encounter for immunization: Secondary | ICD-10-CM | POA: Diagnosis not present

## 2022-11-12 DIAGNOSIS — I350 Nonrheumatic aortic (valve) stenosis: Secondary | ICD-10-CM | POA: Diagnosis not present

## 2022-11-12 DIAGNOSIS — N3281 Overactive bladder: Secondary | ICD-10-CM | POA: Diagnosis not present

## 2022-11-12 DIAGNOSIS — I6523 Occlusion and stenosis of bilateral carotid arteries: Secondary | ICD-10-CM | POA: Diagnosis not present

## 2022-11-12 DIAGNOSIS — Z952 Presence of prosthetic heart valve: Secondary | ICD-10-CM | POA: Diagnosis not present

## 2022-11-12 DIAGNOSIS — E669 Obesity, unspecified: Secondary | ICD-10-CM | POA: Diagnosis not present

## 2022-11-12 DIAGNOSIS — D126 Benign neoplasm of colon, unspecified: Secondary | ICD-10-CM | POA: Diagnosis not present

## 2022-11-14 ENCOUNTER — Encounter: Payer: Self-pay | Admitting: Cardiology

## 2022-11-14 DIAGNOSIS — E782 Mixed hyperlipidemia: Secondary | ICD-10-CM

## 2022-11-14 MED ORDER — ATORVASTATIN CALCIUM 20 MG PO TABS: 20.0000 mg | ORAL_TABLET | Freq: Every day | ORAL | 3 refills | Status: DC

## 2022-11-14 NOTE — Addendum Note (Signed)
Addended by: Elder Negus on: 11/14/2022 02:18 PM   Modules accepted: Orders

## 2022-11-14 NOTE — Progress Notes (Signed)
Spoke with the patient.  LDL 116, triglyceride 159 since stopping Crestor 40 mg that caused myalgias and balance issues.  Patient is open to trying Lipitor 20 mg daily.  Prescription sent.  Repeat lipid panel in 3 months.  Elder Negus, MD

## 2022-11-17 ENCOUNTER — Other Ambulatory Visit: Payer: Self-pay

## 2022-11-17 DIAGNOSIS — E782 Mixed hyperlipidemia: Secondary | ICD-10-CM

## 2022-11-17 MED ORDER — ATORVASTATIN CALCIUM 20 MG PO TABS
20.0000 mg | ORAL_TABLET | Freq: Every day | ORAL | 3 refills | Status: AC
Start: 2022-11-17 — End: 2023-02-15

## 2022-11-25 DIAGNOSIS — X32XXXD Exposure to sunlight, subsequent encounter: Secondary | ICD-10-CM | POA: Diagnosis not present

## 2022-11-25 DIAGNOSIS — L820 Inflamed seborrheic keratosis: Secondary | ICD-10-CM | POA: Diagnosis not present

## 2022-11-25 DIAGNOSIS — L570 Actinic keratosis: Secondary | ICD-10-CM | POA: Diagnosis not present

## 2022-12-02 ENCOUNTER — Telehealth: Payer: Self-pay | Admitting: Cardiology

## 2022-12-02 MED ORDER — EZETIMIBE 10 MG PO TABS: 10.0000 mg | ORAL_TABLET | Freq: Every day | ORAL | 3 refills | Status: DC

## 2022-12-02 NOTE — Telephone Encounter (Signed)
Yes, okay with me. Please send 10 mg pills and repeat lipid panel in 3 months.  Thanks MJP

## 2022-12-02 NOTE — Telephone Encounter (Signed)
Pt c/o medication issue:  1. Name of Medication: Atorvastatin  2. How are you currently taking this medication (dosage and times per day)?   3. Are you having a reaction (difficulty breathing--STAT)?   4. What is your medication issue? Feel like he is going to fall over, wants to stop taking this mediciner

## 2022-12-02 NOTE — Telephone Encounter (Signed)
Patient is unable to tolerate atorvastatin. He is having similar symptoms that he had on crestor. He would like to know if he can try zetia.

## 2022-12-02 NOTE — Telephone Encounter (Signed)
Prescription has been sent in and lab work has been scheduled.

## 2022-12-18 DIAGNOSIS — M5417 Radiculopathy, lumbosacral region: Secondary | ICD-10-CM | POA: Diagnosis not present

## 2022-12-18 DIAGNOSIS — M9903 Segmental and somatic dysfunction of lumbar region: Secondary | ICD-10-CM | POA: Diagnosis not present

## 2022-12-18 DIAGNOSIS — M5415 Radiculopathy, thoracolumbar region: Secondary | ICD-10-CM | POA: Diagnosis not present

## 2022-12-18 DIAGNOSIS — M9902 Segmental and somatic dysfunction of thoracic region: Secondary | ICD-10-CM | POA: Diagnosis not present

## 2022-12-18 DIAGNOSIS — M5136 Other intervertebral disc degeneration, lumbar region with discogenic back pain only: Secondary | ICD-10-CM | POA: Diagnosis not present

## 2022-12-18 DIAGNOSIS — M9905 Segmental and somatic dysfunction of pelvic region: Secondary | ICD-10-CM | POA: Diagnosis not present

## 2023-01-22 DIAGNOSIS — M5415 Radiculopathy, thoracolumbar region: Secondary | ICD-10-CM | POA: Diagnosis not present

## 2023-01-22 DIAGNOSIS — M9905 Segmental and somatic dysfunction of pelvic region: Secondary | ICD-10-CM | POA: Diagnosis not present

## 2023-01-22 DIAGNOSIS — M5417 Radiculopathy, lumbosacral region: Secondary | ICD-10-CM | POA: Diagnosis not present

## 2023-01-22 DIAGNOSIS — M9903 Segmental and somatic dysfunction of lumbar region: Secondary | ICD-10-CM | POA: Diagnosis not present

## 2023-01-22 DIAGNOSIS — M9902 Segmental and somatic dysfunction of thoracic region: Secondary | ICD-10-CM | POA: Diagnosis not present

## 2023-03-09 DIAGNOSIS — M9902 Segmental and somatic dysfunction of thoracic region: Secondary | ICD-10-CM | POA: Diagnosis not present

## 2023-03-09 DIAGNOSIS — M9905 Segmental and somatic dysfunction of pelvic region: Secondary | ICD-10-CM | POA: Diagnosis not present

## 2023-03-09 DIAGNOSIS — M5417 Radiculopathy, lumbosacral region: Secondary | ICD-10-CM | POA: Diagnosis not present

## 2023-03-09 DIAGNOSIS — M9903 Segmental and somatic dysfunction of lumbar region: Secondary | ICD-10-CM | POA: Diagnosis not present

## 2023-03-09 DIAGNOSIS — M5415 Radiculopathy, thoracolumbar region: Secondary | ICD-10-CM | POA: Diagnosis not present

## 2023-03-10 ENCOUNTER — Other Ambulatory Visit: Payer: Medicare Other

## 2023-03-10 DIAGNOSIS — E782 Mixed hyperlipidemia: Secondary | ICD-10-CM | POA: Diagnosis not present

## 2023-03-11 LAB — LIPID PANEL
Chol/HDL Ratio: 4.8 {ratio} (ref 0.0–5.0)
Cholesterol, Total: 191 mg/dL (ref 100–199)
HDL: 40 mg/dL (ref >39)
LDL Chol Calc (NIH): 133 mg/dL — ABNORMAL HIGH (ref 0–99)
Triglycerides: 98 mg/dL (ref 0–149)
VLDL Cholesterol Cal: 18 mg/dL (ref 5–40)

## 2023-03-12 NOTE — Progress Notes (Signed)
 LDL is improved from 158 to 133, but still remains higher than goal <100. Is he tolerating Lipitor 20 mg daily? If yes, is he willing to increase to 40 mg daily?  If yes, please send prescription, and repeat lipid panel in 3 months.  Thanks MJP

## 2023-03-13 ENCOUNTER — Telehealth: Payer: Self-pay | Admitting: *Deleted

## 2023-03-13 DIAGNOSIS — E782 Mixed hyperlipidemia: Secondary | ICD-10-CM

## 2023-03-13 NOTE — Progress Notes (Signed)
 I remember this now.  We could consider a low intensity statin at low-dose, such as simvastatin 20 mg daily.  If patient is agreeable, please add, and repeat lipid panel in 3 months.  Thanks MJP

## 2023-03-13 NOTE — Telephone Encounter (Signed)
 Pt is not taking lipitor anymore, you discontinued it and switched to zetia  10 mg, due to side effects of balance issues with lipitor.  Please advise.  Maeola Eans Me     03/13/23  8:36 AM Result Note The patient has been notified of the result and verbalized understanding.  All questions (if any) were answered. Pt states he is no longer taking lipitor, for he couldn't tolerate this medication and that's why Dr. Elmira switched him to zetia  10 mg po daily instead. Pt aware that I will speak with Dr. Elmira about this, and follow-up with him, with any additional recommendations. Pt verbalized understanding and agrees with this plan.

## 2023-03-13 NOTE — Telephone Encounter (Signed)
-----   Message from [facility] sent at 03/13/2023 10:00 AM EST ----- I remember this now.  We could consider a low intensity statin at low-dose, such as simvastatin 20 mg daily.  If patient is agreeable, please add, and repeat lipid panel in 3 months.  Thanks MJP

## 2023-03-13 NOTE — Telephone Encounter (Signed)
 Spoke with the pt about further recommendations per Dr. Elmira.  Pt states he wants to hold off on simvastatin for now, and increase his exercise regimen as well as follow the mediterranean diet for the next 3 months with repeat lipids at that time.  Spoke with Dr. Elmira about this and he agreed to this plan.  Repeat lipids in 3 months placed and pt aware to come have this done sometime in April.  He is aware to come fasting to this lab appt.   Pt verbalized understanding and agrees with this plan.

## 2023-04-06 DIAGNOSIS — M5415 Radiculopathy, thoracolumbar region: Secondary | ICD-10-CM | POA: Diagnosis not present

## 2023-04-06 DIAGNOSIS — M9902 Segmental and somatic dysfunction of thoracic region: Secondary | ICD-10-CM | POA: Diagnosis not present

## 2023-04-06 DIAGNOSIS — M9905 Segmental and somatic dysfunction of pelvic region: Secondary | ICD-10-CM | POA: Diagnosis not present

## 2023-04-06 DIAGNOSIS — M9903 Segmental and somatic dysfunction of lumbar region: Secondary | ICD-10-CM | POA: Diagnosis not present

## 2023-04-06 DIAGNOSIS — M5417 Radiculopathy, lumbosacral region: Secondary | ICD-10-CM | POA: Diagnosis not present

## 2023-05-11 DIAGNOSIS — M9902 Segmental and somatic dysfunction of thoracic region: Secondary | ICD-10-CM | POA: Diagnosis not present

## 2023-05-11 DIAGNOSIS — M5415 Radiculopathy, thoracolumbar region: Secondary | ICD-10-CM | POA: Diagnosis not present

## 2023-05-11 DIAGNOSIS — M9903 Segmental and somatic dysfunction of lumbar region: Secondary | ICD-10-CM | POA: Diagnosis not present

## 2023-05-11 DIAGNOSIS — M5417 Radiculopathy, lumbosacral region: Secondary | ICD-10-CM | POA: Diagnosis not present

## 2023-05-11 DIAGNOSIS — M9905 Segmental and somatic dysfunction of pelvic region: Secondary | ICD-10-CM | POA: Diagnosis not present

## 2023-05-12 DIAGNOSIS — E7800 Pure hypercholesterolemia, unspecified: Secondary | ICD-10-CM | POA: Diagnosis not present

## 2023-05-12 DIAGNOSIS — I119 Hypertensive heart disease without heart failure: Secondary | ICD-10-CM | POA: Diagnosis not present

## 2023-05-13 DIAGNOSIS — Z85820 Personal history of malignant melanoma of skin: Secondary | ICD-10-CM | POA: Diagnosis not present

## 2023-05-13 DIAGNOSIS — L570 Actinic keratosis: Secondary | ICD-10-CM | POA: Diagnosis not present

## 2023-05-13 DIAGNOSIS — Z08 Encounter for follow-up examination after completed treatment for malignant neoplasm: Secondary | ICD-10-CM | POA: Diagnosis not present

## 2023-05-13 DIAGNOSIS — B078 Other viral warts: Secondary | ICD-10-CM | POA: Diagnosis not present

## 2023-05-13 DIAGNOSIS — X32XXXD Exposure to sunlight, subsequent encounter: Secondary | ICD-10-CM | POA: Diagnosis not present

## 2023-05-13 DIAGNOSIS — B079 Viral wart, unspecified: Secondary | ICD-10-CM | POA: Diagnosis not present

## 2023-05-19 DIAGNOSIS — D126 Benign neoplasm of colon, unspecified: Secondary | ICD-10-CM | POA: Diagnosis not present

## 2023-05-19 DIAGNOSIS — I350 Nonrheumatic aortic (valve) stenosis: Secondary | ICD-10-CM | POA: Diagnosis not present

## 2023-05-19 DIAGNOSIS — E7800 Pure hypercholesterolemia, unspecified: Secondary | ICD-10-CM | POA: Diagnosis not present

## 2023-05-19 DIAGNOSIS — N3281 Overactive bladder: Secondary | ICD-10-CM | POA: Diagnosis not present

## 2023-05-19 DIAGNOSIS — R82998 Other abnormal findings in urine: Secondary | ICD-10-CM | POA: Diagnosis not present

## 2023-05-19 DIAGNOSIS — E669 Obesity, unspecified: Secondary | ICD-10-CM | POA: Diagnosis not present

## 2023-05-19 DIAGNOSIS — Z0000 Encounter for general adult medical examination without abnormal findings: Secondary | ICD-10-CM | POA: Diagnosis not present

## 2023-05-19 DIAGNOSIS — Z952 Presence of prosthetic heart valve: Secondary | ICD-10-CM | POA: Diagnosis not present

## 2023-05-19 DIAGNOSIS — Z1331 Encounter for screening for depression: Secondary | ICD-10-CM | POA: Diagnosis not present

## 2023-05-19 DIAGNOSIS — I10 Essential (primary) hypertension: Secondary | ICD-10-CM | POA: Diagnosis not present

## 2023-05-19 DIAGNOSIS — Z1339 Encounter for screening examination for other mental health and behavioral disorders: Secondary | ICD-10-CM | POA: Diagnosis not present

## 2023-05-19 DIAGNOSIS — I119 Hypertensive heart disease without heart failure: Secondary | ICD-10-CM | POA: Diagnosis not present

## 2023-05-19 DIAGNOSIS — F5221 Male erectile disorder: Secondary | ICD-10-CM | POA: Diagnosis not present

## 2023-05-19 DIAGNOSIS — I6523 Occlusion and stenosis of bilateral carotid arteries: Secondary | ICD-10-CM | POA: Diagnosis not present

## 2023-05-28 ENCOUNTER — Telehealth: Payer: Self-pay | Admitting: Cardiology

## 2023-05-28 DIAGNOSIS — E782 Mixed hyperlipidemia: Secondary | ICD-10-CM

## 2023-05-28 NOTE — Telephone Encounter (Signed)
 Pt reports that he is unable to take Lipitor because it causes him to lose his balance. Pt admits that Crestor made him off balance as well. Pt admits that he has been taking Zetia 10 mg once daily. Pt admits that he has been taking Zetia for the last 2 to 3 months.

## 2023-05-28 NOTE — Telephone Encounter (Signed)
 Reviewed recent lipid panel.  LDL remains elevated at 133.  Please check if patient is taking any statin.  If taking Lipitor 20 mg daily and tolerating, recommend increasing to 40 mg daily and repeat lipid panel in 3 months.  If not to continue Lipitor, please check if he is open to taking statin.  If not, then recommend Zetia 10 mg daily and check lipid panel in 3 months.  Thanks MJP

## 2023-05-29 NOTE — Telephone Encounter (Signed)
 One option would be to use injectable agents, but if he is not to keen on it, okay to just continue Zetia.  Thanks MJP

## 2023-06-01 NOTE — Telephone Encounter (Signed)
 Pt would like to hold off on injectable at this time and he will continue Zetia as prescribed.

## 2023-06-01 NOTE — Telephone Encounter (Signed)
 Noted.  Thanks MJP

## 2023-06-08 DIAGNOSIS — M9905 Segmental and somatic dysfunction of pelvic region: Secondary | ICD-10-CM | POA: Diagnosis not present

## 2023-06-08 DIAGNOSIS — M5415 Radiculopathy, thoracolumbar region: Secondary | ICD-10-CM | POA: Diagnosis not present

## 2023-06-08 DIAGNOSIS — M9902 Segmental and somatic dysfunction of thoracic region: Secondary | ICD-10-CM | POA: Diagnosis not present

## 2023-06-08 DIAGNOSIS — M5417 Radiculopathy, lumbosacral region: Secondary | ICD-10-CM | POA: Diagnosis not present

## 2023-06-08 DIAGNOSIS — M9903 Segmental and somatic dysfunction of lumbar region: Secondary | ICD-10-CM | POA: Diagnosis not present

## 2023-06-30 DIAGNOSIS — X32XXXD Exposure to sunlight, subsequent encounter: Secondary | ICD-10-CM | POA: Diagnosis not present

## 2023-06-30 DIAGNOSIS — D0362 Melanoma in situ of left upper limb, including shoulder: Secondary | ICD-10-CM | POA: Diagnosis not present

## 2023-06-30 DIAGNOSIS — D225 Melanocytic nevi of trunk: Secondary | ICD-10-CM | POA: Diagnosis not present

## 2023-06-30 DIAGNOSIS — Z1283 Encounter for screening for malignant neoplasm of skin: Secondary | ICD-10-CM | POA: Diagnosis not present

## 2023-06-30 DIAGNOSIS — Z85820 Personal history of malignant melanoma of skin: Secondary | ICD-10-CM | POA: Diagnosis not present

## 2023-06-30 DIAGNOSIS — L570 Actinic keratosis: Secondary | ICD-10-CM | POA: Diagnosis not present

## 2023-06-30 DIAGNOSIS — L110 Acquired keratosis follicularis: Secondary | ICD-10-CM | POA: Diagnosis not present

## 2023-06-30 DIAGNOSIS — L820 Inflamed seborrheic keratosis: Secondary | ICD-10-CM | POA: Diagnosis not present

## 2023-06-30 DIAGNOSIS — Z08 Encounter for follow-up examination after completed treatment for malignant neoplasm: Secondary | ICD-10-CM | POA: Diagnosis not present

## 2023-07-06 DIAGNOSIS — M9902 Segmental and somatic dysfunction of thoracic region: Secondary | ICD-10-CM | POA: Diagnosis not present

## 2023-07-06 DIAGNOSIS — M9905 Segmental and somatic dysfunction of pelvic region: Secondary | ICD-10-CM | POA: Diagnosis not present

## 2023-07-06 DIAGNOSIS — M5415 Radiculopathy, thoracolumbar region: Secondary | ICD-10-CM | POA: Diagnosis not present

## 2023-07-06 DIAGNOSIS — M9903 Segmental and somatic dysfunction of lumbar region: Secondary | ICD-10-CM | POA: Diagnosis not present

## 2023-07-06 DIAGNOSIS — M5417 Radiculopathy, lumbosacral region: Secondary | ICD-10-CM | POA: Diagnosis not present

## 2023-08-03 DIAGNOSIS — M5417 Radiculopathy, lumbosacral region: Secondary | ICD-10-CM | POA: Diagnosis not present

## 2023-08-03 DIAGNOSIS — M5415 Radiculopathy, thoracolumbar region: Secondary | ICD-10-CM | POA: Diagnosis not present

## 2023-08-03 DIAGNOSIS — M9905 Segmental and somatic dysfunction of pelvic region: Secondary | ICD-10-CM | POA: Diagnosis not present

## 2023-08-03 DIAGNOSIS — M9902 Segmental and somatic dysfunction of thoracic region: Secondary | ICD-10-CM | POA: Diagnosis not present

## 2023-08-03 DIAGNOSIS — M9903 Segmental and somatic dysfunction of lumbar region: Secondary | ICD-10-CM | POA: Diagnosis not present

## 2023-08-11 DIAGNOSIS — D0362 Melanoma in situ of left upper limb, including shoulder: Secondary | ICD-10-CM | POA: Diagnosis not present

## 2023-08-11 DIAGNOSIS — L988 Other specified disorders of the skin and subcutaneous tissue: Secondary | ICD-10-CM | POA: Diagnosis not present

## 2023-08-31 DIAGNOSIS — M5415 Radiculopathy, thoracolumbar region: Secondary | ICD-10-CM | POA: Diagnosis not present

## 2023-08-31 DIAGNOSIS — M9902 Segmental and somatic dysfunction of thoracic region: Secondary | ICD-10-CM | POA: Diagnosis not present

## 2023-08-31 DIAGNOSIS — M9905 Segmental and somatic dysfunction of pelvic region: Secondary | ICD-10-CM | POA: Diagnosis not present

## 2023-08-31 DIAGNOSIS — M9903 Segmental and somatic dysfunction of lumbar region: Secondary | ICD-10-CM | POA: Diagnosis not present

## 2023-08-31 DIAGNOSIS — M5417 Radiculopathy, lumbosacral region: Secondary | ICD-10-CM | POA: Diagnosis not present

## 2023-09-24 ENCOUNTER — Ambulatory Visit: Payer: Self-pay | Admitting: Cardiology

## 2023-09-28 DIAGNOSIS — M9903 Segmental and somatic dysfunction of lumbar region: Secondary | ICD-10-CM | POA: Diagnosis not present

## 2023-09-28 DIAGNOSIS — M5415 Radiculopathy, thoracolumbar region: Secondary | ICD-10-CM | POA: Diagnosis not present

## 2023-09-28 DIAGNOSIS — M9902 Segmental and somatic dysfunction of thoracic region: Secondary | ICD-10-CM | POA: Diagnosis not present

## 2023-09-28 DIAGNOSIS — M5417 Radiculopathy, lumbosacral region: Secondary | ICD-10-CM | POA: Diagnosis not present

## 2023-09-28 DIAGNOSIS — M9905 Segmental and somatic dysfunction of pelvic region: Secondary | ICD-10-CM | POA: Diagnosis not present

## 2023-10-23 ENCOUNTER — Other Ambulatory Visit: Payer: Self-pay | Admitting: Cardiology

## 2023-10-23 DIAGNOSIS — I10 Essential (primary) hypertension: Secondary | ICD-10-CM

## 2023-10-24 ENCOUNTER — Other Ambulatory Visit: Payer: Self-pay | Admitting: Cardiology

## 2023-10-24 DIAGNOSIS — I10 Essential (primary) hypertension: Secondary | ICD-10-CM

## 2023-10-26 DIAGNOSIS — M5417 Radiculopathy, lumbosacral region: Secondary | ICD-10-CM | POA: Diagnosis not present

## 2023-10-26 DIAGNOSIS — M5415 Radiculopathy, thoracolumbar region: Secondary | ICD-10-CM | POA: Diagnosis not present

## 2023-10-26 DIAGNOSIS — M9903 Segmental and somatic dysfunction of lumbar region: Secondary | ICD-10-CM | POA: Diagnosis not present

## 2023-10-26 DIAGNOSIS — M9905 Segmental and somatic dysfunction of pelvic region: Secondary | ICD-10-CM | POA: Diagnosis not present

## 2023-10-26 DIAGNOSIS — M9902 Segmental and somatic dysfunction of thoracic region: Secondary | ICD-10-CM | POA: Diagnosis not present

## 2023-11-10 DIAGNOSIS — Z952 Presence of prosthetic heart valve: Secondary | ICD-10-CM | POA: Diagnosis not present

## 2023-11-10 DIAGNOSIS — E7800 Pure hypercholesterolemia, unspecified: Secondary | ICD-10-CM | POA: Diagnosis not present

## 2023-11-10 DIAGNOSIS — D126 Benign neoplasm of colon, unspecified: Secondary | ICD-10-CM | POA: Diagnosis not present

## 2023-11-10 DIAGNOSIS — F5221 Male erectile disorder: Secondary | ICD-10-CM | POA: Diagnosis not present

## 2023-11-10 DIAGNOSIS — E669 Obesity, unspecified: Secondary | ICD-10-CM | POA: Diagnosis not present

## 2023-11-10 DIAGNOSIS — I6523 Occlusion and stenosis of bilateral carotid arteries: Secondary | ICD-10-CM | POA: Diagnosis not present

## 2023-11-10 DIAGNOSIS — N3281 Overactive bladder: Secondary | ICD-10-CM | POA: Diagnosis not present

## 2023-11-10 DIAGNOSIS — I350 Nonrheumatic aortic (valve) stenosis: Secondary | ICD-10-CM | POA: Diagnosis not present

## 2023-11-10 DIAGNOSIS — I119 Hypertensive heart disease without heart failure: Secondary | ICD-10-CM | POA: Diagnosis not present

## 2023-11-20 ENCOUNTER — Other Ambulatory Visit (HOSPITAL_COMMUNITY): Payer: Self-pay

## 2023-11-20 ENCOUNTER — Other Ambulatory Visit: Payer: Self-pay

## 2023-11-20 ENCOUNTER — Telehealth: Payer: Self-pay | Admitting: Cardiology

## 2023-11-20 MED ORDER — EZETIMIBE 10 MG PO TABS
10.0000 mg | ORAL_TABLET | Freq: Every day | ORAL | 1 refills | Status: DC
  Filled 2023-11-20: qty 30, 30d supply, fill #0
  Filled 2023-12-22: qty 30, 30d supply, fill #1
  Filled 2023-12-25: qty 30, 30d supply, fill #0

## 2023-11-20 NOTE — Telephone Encounter (Signed)
 Pt's medication was sent to pt's pharmacy as requested. Confirmation received.

## 2023-11-24 DIAGNOSIS — M9905 Segmental and somatic dysfunction of pelvic region: Secondary | ICD-10-CM | POA: Diagnosis not present

## 2023-11-24 DIAGNOSIS — M9903 Segmental and somatic dysfunction of lumbar region: Secondary | ICD-10-CM | POA: Diagnosis not present

## 2023-11-24 DIAGNOSIS — M7552 Bursitis of left shoulder: Secondary | ICD-10-CM | POA: Diagnosis not present

## 2023-11-24 DIAGNOSIS — M5417 Radiculopathy, lumbosacral region: Secondary | ICD-10-CM | POA: Diagnosis not present

## 2023-11-24 DIAGNOSIS — M9902 Segmental and somatic dysfunction of thoracic region: Secondary | ICD-10-CM | POA: Diagnosis not present

## 2023-11-24 DIAGNOSIS — M5415 Radiculopathy, thoracolumbar region: Secondary | ICD-10-CM | POA: Diagnosis not present

## 2023-12-24 DIAGNOSIS — M5417 Radiculopathy, lumbosacral region: Secondary | ICD-10-CM | POA: Diagnosis not present

## 2023-12-24 DIAGNOSIS — M9902 Segmental and somatic dysfunction of thoracic region: Secondary | ICD-10-CM | POA: Diagnosis not present

## 2023-12-24 DIAGNOSIS — M9903 Segmental and somatic dysfunction of lumbar region: Secondary | ICD-10-CM | POA: Diagnosis not present

## 2023-12-24 DIAGNOSIS — M5415 Radiculopathy, thoracolumbar region: Secondary | ICD-10-CM | POA: Diagnosis not present

## 2023-12-24 DIAGNOSIS — M9905 Segmental and somatic dysfunction of pelvic region: Secondary | ICD-10-CM | POA: Diagnosis not present

## 2023-12-25 ENCOUNTER — Other Ambulatory Visit: Payer: Self-pay

## 2023-12-25 ENCOUNTER — Other Ambulatory Visit (HOSPITAL_COMMUNITY): Payer: Self-pay

## 2024-01-11 ENCOUNTER — Encounter: Payer: Self-pay | Admitting: Cardiology

## 2024-01-11 ENCOUNTER — Ambulatory Visit: Attending: Cardiology | Admitting: Cardiology

## 2024-01-11 VITALS — BP 115/63 | HR 73 | Ht 70.0 in | Wt 195.0 lb

## 2024-01-11 DIAGNOSIS — I10 Essential (primary) hypertension: Secondary | ICD-10-CM | POA: Diagnosis not present

## 2024-01-11 DIAGNOSIS — Z789 Other specified health status: Secondary | ICD-10-CM | POA: Insufficient documentation

## 2024-01-11 DIAGNOSIS — I6523 Occlusion and stenosis of bilateral carotid arteries: Secondary | ICD-10-CM | POA: Diagnosis not present

## 2024-01-11 DIAGNOSIS — Z952 Presence of prosthetic heart valve: Secondary | ICD-10-CM | POA: Insufficient documentation

## 2024-01-11 DIAGNOSIS — E782 Mixed hyperlipidemia: Secondary | ICD-10-CM | POA: Diagnosis present

## 2024-01-11 MED ORDER — EZETIMIBE 10 MG PO TABS: 10.0000 mg | ORAL_TABLET | Freq: Every day | ORAL | 3 refills | Status: AC

## 2024-01-11 MED ORDER — ASPIRIN 81 MG PO TBEC: 81.0000 mg | DELAYED_RELEASE_TABLET | Freq: Every day | ORAL | 3 refills | Status: AC

## 2024-01-11 NOTE — Patient Instructions (Signed)
 Medication Instructions:  START Aspirin  81 mg daily  START Zetia  10 mg daily   *If you need a refill on your cardiac medications before your next appointment, please call your pharmacy*  Testing/Procedures: ECHOCARDIOGRAM   Your physician has requested that you have an echocardiogram. Echocardiography is a painless test that uses sound waves to create images of your heart. It provides your doctor with information about the size and shape of your heart and how well your heart's chambers and valves are working. This procedure takes approximately one hour. There are no restrictions for this procedure. Please do NOT wear cologne, perfume, aftershave, or lotions (deodorant is allowed). Please arrive 15 minutes prior to your appointment time.  Please note: We ask at that you not bring children with you during ultrasound (echo/ vascular) testing. Due to room size and safety concerns, children are not allowed in the ultrasound rooms during exams. Our front office staff cannot provide observation of children in our lobby area while testing is being conducted. An adult accompanying a patient to their appointment will only be allowed in the ultrasound room at the discretion of the ultrasound technician under special circumstances. We apologize for any inconvenience.  CAROTID DUPLEX  Your physician has requested that you have a carotid duplex. This test is an ultrasound of the carotid arteries in your neck. It looks at blood flow through these arteries that supply the brain with blood. Allow one hour for this exam. There are no restrictions or special instructions.    REFERRAL TO PHARM D FOR LIPID CLINIC    Follow-Up: At [facility], you and your health needs are our priority.  As part of our continuing mission to provide you with exceptional heart care, our providers are all part of one team.  This team includes your primary Cardiologist (physician) and Advanced Practice Providers or APPs  (Physician Assistants and Nurse Practitioners) who all work together to provide you with the care you need, when you need it.  Your next appointment:   1 year(s)  Provider:   Newman JINNY Lawrence, MD    We recommend signing up for the patient portal called MyChart.  Sign up information is provided on this After Visit Summary.  MyChart is used to connect with patients for Virtual Visits (Telemedicine).  Patients are able to view lab/test results, encounter notes, upcoming appointments, etc.  Non-urgent messages can be sent to your provider as well.   To learn more about what you can do with MyChart, go to [email or web address].

## 2024-01-11 NOTE — Progress Notes (Signed)
 Cardiology Office Note:  .   Date:  01/11/2024  ID:  [name] [name], DOB August 27, 1931, MRN [id number] PCP: Vernadine Charlie ORN, MD  [facility] Providers Cardiologist:  Newman Lawrence, MD PCP: Vernadine Charlie ORN, MD  No chief complaint on file.    [name] [name] is a 89 y.o. male with hypertension, s/p TAVR (29 mm Dallas Sapien-07/2019) for severe AS, bilateral asymptomatic mild-mod carotid stenosis.    History of Present Illness   Patient is doing very well.  He recently returned from a trip to [place] .  He stays active with 2 mile walks 3 times a week, as [facility]  He denies any complaints of chest pain or shortness of breath.  Blood pressure is very well-controlled.  He is not on statin due to prior statin intolerance, currently taking Zetia  10 mg daily.    Vitals:   01/11/24 0855  BP: 115/63  Pulse: 73  SpO2: 96%      Review of Systems  Cardiovascular:  Negative for chest pain, dyspnea on exertion, leg swelling, palpitations and syncope.        Studies Reviewed: SABRA        EKG 01/11/2024: Normal sinus rhythm with sinus arrhythmia Normal ECG When compared with ECG of 06-Jul-2019 04:37, Nonspecific T wave abnormality no longer evident in Inferior leads  Echocardiogram 2022: Normal LV systolic function with visual EF 60-65%. Left ventricle cavity is normal in size. Severe left ventricular hypertrophy. Normal global wall motion. Indeterminate diastolic filling pattern, normal LAP. Left atrial cavity is mildly dilated. Bioprosthetic valve is well-seated, no evidence of dehiscence, no perivalvular regurgitation (Edwards Sapien 3 THV size 29 mm, placed 07/2019). No evidence of aortic stenosis. No aortic valve regurgitation. Mild (Grade I) mitral regurgitation. Moderate tricuspid regurgitation. No evidence of pulmonary hypertension. Moderate pulmonic regurgitation. Compared to study dated 08/17/2019 Mild TR and PR are  now moderate otherwise no significant change.  Labs 05/2023: Chol 196, TG 141, HDL 35, LDL 133  Physical Exam Vitals and nursing note reviewed.  Constitutional:      General: He is not in acute distress. Neck:     Vascular: No JVD.  Cardiovascular:     Rate and Rhythm: Normal rate and regular rhythm.     Heart sounds: Normal heart sounds. No murmur heard. Pulmonary:     Effort: Pulmonary effort is normal.     Breath sounds: Normal breath sounds. No wheezing or rales.  Musculoskeletal:     Right lower leg: No edema.     Left lower leg: No edema.      VISIT DIAGNOSES:   ICD-10-CM   1. Essential hypertension  I10 EKG 12-Lead    ECHOCARDIOGRAM COMPLETE    2. Mixed hyperlipidemia  E78.2 EKG 12-Lead    AMB Referral to [facility] Pharm-D    3. S/P AVR  Z95.2 ECHOCARDIOGRAM COMPLETE    4. Statin intolerance  Z78.9 AMB Referral to [facility] Pharm-D    5. Bilateral carotid artery stenosis  I65.23 VAS US  CAROTID       [name] [name] is a 89 y.o. male with hypertension, s/p TAVR (29 mm Dallas Sapien-07/2019) for severe AS, bilateral asymptomatic mild-mod carotid stenosis.   Assessment & Plan  Aortic stenosis: Now resolved s/p TAVR (07/2019). No dyspnea now. He has NYHA I symptoms. Valve functioning well. No paravalvular leak. (07/2020) He was off aspirin  as he was taking meloxicam for joint related pain, which has since improved. Emphasize importance  of taking aspirin  81 mg daily. Also, will check echocardiogram.  Hypertension: Very well-controlled on losartan  50 mg daily, amlodipine  10 mg daily.    Carotid artery stenosis: Bilateral, asymptomatic.  Continue aspirin , Zetia . Known statin intolerance due to myalgias, not keen on going back on statin. Given uncontrolled LDL, referred to lipid clinic for consideration for injectable agents, particularly Leqvio.      Meds ordered this encounter  Medications   aspirin  EC 81 MG tablet    Sig: Take 1 tablet (81 mg total) by  mouth daily. Swallow whole.    Dispense:  90 tablet    Refill:  3   ezetimibe  (ZETIA ) 10 MG tablet    Sig: Take 1 tablet (10 mg total) by mouth daily.    Dispense:  90 tablet    Refill:  3     F/u in 1 year  Signed, Newman JINNY Lawrence, MD

## 2024-01-21 DIAGNOSIS — M9902 Segmental and somatic dysfunction of thoracic region: Secondary | ICD-10-CM | POA: Diagnosis not present

## 2024-01-21 DIAGNOSIS — M5417 Radiculopathy, lumbosacral region: Secondary | ICD-10-CM | POA: Diagnosis not present

## 2024-01-21 DIAGNOSIS — M9905 Segmental and somatic dysfunction of pelvic region: Secondary | ICD-10-CM | POA: Diagnosis not present

## 2024-01-21 DIAGNOSIS — M9903 Segmental and somatic dysfunction of lumbar region: Secondary | ICD-10-CM | POA: Diagnosis not present

## 2024-01-21 DIAGNOSIS — M5415 Radiculopathy, thoracolumbar region: Secondary | ICD-10-CM | POA: Diagnosis not present

## 2024-02-17 ENCOUNTER — Ambulatory Visit (HOSPITAL_COMMUNITY): Admission: RE | Admit: 2024-02-17 | Discharge: 2024-02-17 | Attending: Cardiology

## 2024-02-17 DIAGNOSIS — I10 Essential (primary) hypertension: Secondary | ICD-10-CM | POA: Insufficient documentation

## 2024-02-17 DIAGNOSIS — Z952 Presence of prosthetic heart valve: Secondary | ICD-10-CM | POA: Diagnosis present

## 2024-02-17 DIAGNOSIS — I6523 Occlusion and stenosis of bilateral carotid arteries: Secondary | ICD-10-CM

## 2024-02-17 LAB — ECHOCARDIOGRAM COMPLETE
AR max vel: 2.32 cm2
AV Area VTI: 2.3 cm2
AV Area mean vel: 2.4 cm2
AV Mean grad: 3 mmHg
AV Peak grad: 5.8 mmHg
Ao pk vel: 1.2 m/s
Area-P 1/2: 2.6 cm2
S' Lateral: 3.85 cm

## 2024-02-19 ENCOUNTER — Ambulatory Visit: Attending: Internal Medicine

## 2024-02-19 DIAGNOSIS — E782 Mixed hyperlipidemia: Secondary | ICD-10-CM | POA: Diagnosis present

## 2024-02-19 MED ORDER — ROSUVASTATIN CALCIUM 20 MG PO TABS: 20.0000 mg | ORAL_TABLET | Freq: Every day | ORAL | 3 refills | Status: AC

## 2024-02-19 NOTE — Assessment & Plan Note (Addendum)
 Assessment:  LDL goal: < 100 mg/dl; last LDLc 866 mg/dl (10/7972);  Liver enzymes (11/2023) ALT 13, AST 14, Alk phos 54; will repeat after restarting statin Tolerates Zetia  well without any side effects  Prior trials of both rosuvastatin  and atorvastatin , with similar imbalance symptoms reported Discussed next potential options (PCSK-9 inhibitors, bempedoic acid and inclisiran); cost, dosing efficacy, side effects  Patient is not interested in injectable therapies patient is willing to retry statin therapy at this time. Discussed importance of heart healthy diet and regular physical activity  Plan: Restart rosuvastatin  20 mg daily; Patient to reach out to PharmD with intolerances Continue taking ezetimibe  10 mg daily Lipid lab and CMP due in 3 months after starting statin

## 2024-02-19 NOTE — Progress Notes (Signed)
 Patient ID: [name] [name]                 DOB: 31-Oct-1931                    MRN: [id number]      HPI: [name] [name] is a 89 y.o. male patient referred to lipid clinic by Dr. Elmira. PMH is significant for HLD, HTN, aortic stenosis (s/p TAVR 07/2019), and bilateral asymptomatic mild-mod carotid stenosis.   Patient presents today for a PharmD lipid clinic visit. His most recent LDL was 133 mg/dL in January 2025, above the goal of <100 mg/dL while on ezetimibe  10 mg alone. Documentation indicates prior trials of both rosuvastatin  and atorvastatin , with similar imbalance symptoms reported.   We reviewed options for lowering LDL cholesterol, including statins, PCSK-9 inhibitors, bempedoic acid and inclisiran.  Discussed mechanisms of action, dosing, side effects and potential decreases in LDL cholesterol.  Also reviewed cost information and potential options for patient assistance.   The patient states he is not interested in injectable therapies. 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 After discussion, the patient is willing to retry statin therapy at this time.  Current Medications: ezetimibe  10 mg daily Intolerances: atorvastatin  (imbalance) Risk Factors: age, HTN, aortic and carotid stenosis LDL goal: < 100 Lipid panel (03/2023): Chol 191, Trig 98, HDL 40, LDL 133 Liver enzymes (11/2023): ALT 13, AST 14, Alk phos 54  Diet: Breakfast typically consists of [employer] Grains cereal with almond milk and blueberries. Lunch or dinner, usually around 6-7 PM, includes baked chicken with asparagus or broccoli, small oven-roasted potatoes with minimal butter, carrots with potatoes, or seafood options such as fish, smoked salmon on a bagel with cream cheese, shrimp salad, or a salmon salad. Snacks are rare and usually consist of trail mix. He drinks mostly water. Will  occasionally have alcohol  at dinner  Exercise:  Exercises twice per week at the [facility], focusing on both upper and lower body strength training. Also completes 5 miles on a recumbent bike in 36 minutes.   Family History:   Relation Problem Comments  Mother (Deceased at age 65) Heart failure     Father (Deceased at age 49) Parkinson's disease     Sister (Alive)   Sister Metallurgist)   Brother (Deceased) Heart attack     Social History:  Alcohol : occasionally, may have a alcoholic beverage at dinner Smoking: none  Labs:  Lipid Panel     Component Value Date/Time   CHOL 191 03/10/2023 0850   TRIG 98 03/10/2023 0850   HDL 40 03/10/2023 0850   CHOLHDL 4.8 03/10/2023 0850   LDLCALC 133 (H) 03/10/2023 0850   LABVLDL 18 03/10/2023 0850    Past Medical History:  Diagnosis Date   Acid reflux    Arthritis    minor arthritits in hands   Carotid artery occlusion    High cholesterol    Hypertension    Pneumonia    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 Hyperlipidemia -  Problem  Mixed Hyperlipidemia   Mixed hyperlipidemia Assessment:  LDL goal: < 100 mg/dl; last LDLc 866 mg/dl (10/7972);  Liver enzymes (11/2023) ALT 13,  AST 14, Alk phos 54; will repeat after restarting statin Tolerates Zetia  well without any side effects  Prior trials of both rosuvastatin  and atorvastatin , with similar imbalance symptoms reported Discussed next potential options (PCSK-9 inhibitors, bempedoic acid and inclisiran); cost, dosing efficacy, side effects  Patient is not interested in injectable therapies patient is willing to retry statin therapy at this time. Discussed importance of heart healthy diet and regular physical activity  Plan: Restart rosuvastatin  20 mg daily; Patient to reach out to PharmD  with intolerances Continue taking ezetimibe  10 mg daily Lipid lab and CMP due in 3 months after starting statin   Thank you,  Vyctoria Dickman E. Oz Gammel, Pharm.D, CPP [place] [facility]. [facility] [address], [place], [place] [address] Phone: [phone]; Fax: [phone]        [1]  Current Outpatient Medications on File Prior to Visit  Medication Sig Dispense Refill   amLODipine  (NORVASC ) 10 MG tablet TAKE 1 TABLET BY MOUTH EVERY DAY 90 tablet 0   aspirin  EC 81 MG tablet Take 1 tablet (81 mg total) by mouth daily. Swallow whole. 90 tablet 3   Cholecalciferol (VITAMIN D3) 50 MCG (2000 UT) TABS Take by mouth.     Coenzyme Q10 (CO Q 10) 100 MG CAPS 1 capsule.     ezetimibe  (ZETIA ) 10 MG tablet Take 1 tablet (10 mg total) by mouth daily. 90 tablet 3   L-Lysine 500 MG CAPS Take 500 mg by mouth See admin instructions. Take 500 mg daily or as needed, may take 1000-1500 mg instead when experiencing a cold sore     losartan  (COZAAR ) 50 MG tablet TAKE 1 TABLET BY MOUTH EVERY DAY 90 tablet 3   meloxicam (MOBIC) 15 MG tablet TAKE 1 TABLET BY MOUTH EVERY DAY FOR 21 DAYS     MYRBETRIQ 50 MG TB24 tablet Take 50 mg by mouth daily.     NON FORMULARY Balance nature     NON FORMULARY Take 3 capsules by mouth daily. Balance of nature     sodium chloride  (OCEAN) 0.65 % nasal spray as directed Nasally     No current facility-administered medications on file prior to visit.  [2]  Allergies Allergen Reactions   Latex Rash

## 2024-02-23 ENCOUNTER — Ambulatory Visit: Payer: Self-pay | Admitting: Cardiology

## 2024-02-23 DIAGNOSIS — I519 Heart disease, unspecified: Secondary | ICD-10-CM

## 2024-02-23 DIAGNOSIS — I6523 Occlusion and stenosis of bilateral carotid arteries: Secondary | ICD-10-CM

## 2024-02-23 DIAGNOSIS — I517 Cardiomegaly: Secondary | ICD-10-CM

## 2024-02-23 NOTE — Progress Notes (Signed)
 Left heart function is low normal, valve is working well, there is mild to moderate leakiness of mitral valve.  However, right side of the heart seems to be slightly enlarged and reduced in function.  I am not sure what the cause of this is.  If there is no severe shortness of breath or leg swelling, I would recommend repeating echocardiogram in 6 months. (Diagnosis: RV enlargement/dysfunction). Continue current medications otherwise.  Moderate narrowing of left carotid artery.  Continue current medications, repeat carotid ultrasound in 1 year.  Thanks MJP

## 2024-02-24 NOTE — Progress Notes (Signed)
 Given lack of acute symptoms, okay to check BMP and d-dimer on 12/26.  Thanks MJP

## 2024-02-24 NOTE — Progress Notes (Signed)
 I reviewed the echocardiogram again and compared with his previous echocardiogram in 2021.  He is very active and exercises regularly, which would make PE unlikely.  However, Right ventricular dilatation is entirely new.  This patient have any shortness of breath?  If yes, I would like to consider PE as a differential diagnosis.  Is there any way possible to get a stat BMP and CT angiogram to rule out PE today?  If not, then I would at least check stat BMP and D-dimer today.  If BNP very high, I may have him go to the emergency room.  If mildly elevated, we should get a CT angiogram to rule out PE on 02/26/2024.  Thanks MJP

## 2024-02-27 ENCOUNTER — Encounter: Payer: Self-pay | Admitting: Cardiology

## 2024-02-27 LAB — BASIC METABOLIC PANEL WITH GFR
BUN/Creatinine Ratio: 15 (ref 10–24)
BUN: 17 mg/dL (ref 10–36)
CO2: 22 mmol/L (ref 20–29)
Calcium: 9.2 mg/dL (ref 8.6–10.2)
Chloride: 105 mmol/L (ref 96–106)
Creatinine, Ser: 1.11 mg/dL (ref 0.76–1.27)
Glucose: 112 mg/dL — ABNORMAL HIGH (ref 70–99)
Potassium: 4.4 mmol/L (ref 3.5–5.2)
Sodium: 143 mmol/L (ref 134–144)
eGFR: 63 mL/min/1.73

## 2024-02-27 LAB — D-DIMER, QUANTITATIVE: D-DIMER: 0.7 mg{FEU}/L — ABNORMAL HIGH (ref 0.00–0.49)

## 2024-02-27 MED ORDER — FUROSEMIDE 20 MG PO TABS: 20.0000 mg | ORAL_TABLET | Freq: Every day | ORAL | 3 refills | Status: AC

## 2024-02-27 NOTE — Progress Notes (Signed)
 D dimer age appropriate normal. Low suspicion of PE. Mild leg swelling reported by the patient. While this could also be due to amlodipine  10 mg daily, there is some other dysfunctional iron on echocardiogram. I sent a prescription for a 20 mg daily.  Newman Lawrence, MD

## 2024-04-07 ENCOUNTER — Other Ambulatory Visit: Payer: Self-pay | Admitting: Cardiology

## 2024-04-07 DIAGNOSIS — I10 Essential (primary) hypertension: Secondary | ICD-10-CM
